# Patient Record
Sex: Female | Born: 1965
Health system: Southern US, Community
[De-identification: ages and names within clinical notes are randomized; demographics above are authoritative.]

## PROBLEM LIST (undated history)

## (undated) DIAGNOSIS — IMO0002 Reserved for concepts with insufficient information to code with codable children: Secondary | ICD-10-CM

## (undated) DIAGNOSIS — M1990 Unspecified osteoarthritis, unspecified site: Secondary | ICD-10-CM

## (undated) DIAGNOSIS — F32A Depression, unspecified: Secondary | ICD-10-CM

## (undated) DIAGNOSIS — F419 Anxiety disorder, unspecified: Secondary | ICD-10-CM

## (undated) DIAGNOSIS — K219 Gastro-esophageal reflux disease without esophagitis: Secondary | ICD-10-CM

## (undated) DIAGNOSIS — G8929 Other chronic pain: Secondary | ICD-10-CM

## (undated) DIAGNOSIS — I10 Essential (primary) hypertension: Secondary | ICD-10-CM

## (undated) DIAGNOSIS — C801 Malignant (primary) neoplasm, unspecified: Secondary | ICD-10-CM

## (undated) DIAGNOSIS — Z5189 Encounter for other specified aftercare: Secondary | ICD-10-CM

## (undated) HISTORY — DX: Encounter for other specified aftercare: Z51.89

## (undated) HISTORY — DX: Malignant (primary) neoplasm, unspecified: C80.1

## (undated) HISTORY — DX: Other chronic pain: G89.29

## (undated) HISTORY — DX: Anxiety disorder, unspecified: F41.9

## (undated) HISTORY — DX: Reserved for concepts with insufficient information to code with codable children: IMO0002

## (undated) HISTORY — PX: LUNG SURGERY: SHX703

---

## 2014-09-02 DIAGNOSIS — I10 Essential (primary) hypertension: Secondary | ICD-10-CM | POA: Insufficient documentation

## 2018-08-23 DIAGNOSIS — C3490 Malignant neoplasm of unspecified part of unspecified bronchus or lung: Secondary | ICD-10-CM | POA: Diagnosis not present

## 2018-08-23 DIAGNOSIS — R0602 Shortness of breath: Secondary | ICD-10-CM | POA: Diagnosis not present

## 2018-09-03 DIAGNOSIS — C3411 Malignant neoplasm of upper lobe, right bronchus or lung: Secondary | ICD-10-CM | POA: Diagnosis not present

## 2018-09-11 DIAGNOSIS — C3490 Malignant neoplasm of unspecified part of unspecified bronchus or lung: Secondary | ICD-10-CM | POA: Diagnosis not present

## 2018-09-17 DIAGNOSIS — R911 Solitary pulmonary nodule: Secondary | ICD-10-CM | POA: Diagnosis not present

## 2018-09-17 DIAGNOSIS — Z85118 Personal history of other malignant neoplasm of bronchus and lung: Secondary | ICD-10-CM | POA: Diagnosis not present

## 2018-09-17 DIAGNOSIS — R918 Other nonspecific abnormal finding of lung field: Secondary | ICD-10-CM | POA: Diagnosis not present

## 2018-09-17 DIAGNOSIS — C3411 Malignant neoplasm of upper lobe, right bronchus or lung: Secondary | ICD-10-CM | POA: Diagnosis not present

## 2018-09-21 DIAGNOSIS — C3490 Malignant neoplasm of unspecified part of unspecified bronchus or lung: Secondary | ICD-10-CM | POA: Diagnosis not present

## 2018-10-02 DIAGNOSIS — R918 Other nonspecific abnormal finding of lung field: Secondary | ICD-10-CM | POA: Insufficient documentation

## 2018-10-19 DIAGNOSIS — R918 Other nonspecific abnormal finding of lung field: Secondary | ICD-10-CM | POA: Diagnosis not present

## 2018-10-19 DIAGNOSIS — C3490 Malignant neoplasm of unspecified part of unspecified bronchus or lung: Secondary | ICD-10-CM | POA: Diagnosis not present

## 2018-10-22 DIAGNOSIS — C3411 Malignant neoplasm of upper lobe, right bronchus or lung: Secondary | ICD-10-CM | POA: Diagnosis not present

## 2018-11-06 DIAGNOSIS — R911 Solitary pulmonary nodule: Secondary | ICD-10-CM | POA: Diagnosis not present

## 2018-11-06 DIAGNOSIS — C3411 Malignant neoplasm of upper lobe, right bronchus or lung: Secondary | ICD-10-CM | POA: Diagnosis not present

## 2018-11-15 DIAGNOSIS — C3411 Malignant neoplasm of upper lobe, right bronchus or lung: Secondary | ICD-10-CM | POA: Diagnosis not present

## 2018-11-15 DIAGNOSIS — R0489 Hemorrhage from other sites in respiratory passages: Secondary | ICD-10-CM | POA: Diagnosis not present

## 2018-11-15 DIAGNOSIS — J439 Emphysema, unspecified: Secondary | ICD-10-CM | POA: Diagnosis not present

## 2018-11-15 DIAGNOSIS — Z452 Encounter for adjustment and management of vascular access device: Secondary | ICD-10-CM | POA: Diagnosis not present

## 2018-11-15 DIAGNOSIS — G8918 Other acute postprocedural pain: Secondary | ICD-10-CM | POA: Diagnosis not present

## 2018-11-16 DIAGNOSIS — J9811 Atelectasis: Secondary | ICD-10-CM | POA: Diagnosis not present

## 2018-11-16 DIAGNOSIS — J439 Emphysema, unspecified: Secondary | ICD-10-CM | POA: Diagnosis not present

## 2018-11-16 DIAGNOSIS — Z452 Encounter for adjustment and management of vascular access device: Secondary | ICD-10-CM | POA: Diagnosis not present

## 2018-11-17 DIAGNOSIS — Z452 Encounter for adjustment and management of vascular access device: Secondary | ICD-10-CM | POA: Diagnosis not present

## 2018-11-17 DIAGNOSIS — J439 Emphysema, unspecified: Secondary | ICD-10-CM | POA: Diagnosis not present

## 2018-11-18 DIAGNOSIS — R079 Chest pain, unspecified: Secondary | ICD-10-CM | POA: Diagnosis not present

## 2018-11-18 DIAGNOSIS — J9811 Atelectasis: Secondary | ICD-10-CM | POA: Diagnosis not present

## 2018-11-19 DIAGNOSIS — C3411 Malignant neoplasm of upper lobe, right bronchus or lung: Secondary | ICD-10-CM | POA: Diagnosis not present

## 2018-11-21 DIAGNOSIS — Z483 Aftercare following surgery for neoplasm: Secondary | ICD-10-CM | POA: Diagnosis not present

## 2018-11-27 DIAGNOSIS — C3411 Malignant neoplasm of upper lobe, right bronchus or lung: Secondary | ICD-10-CM | POA: Diagnosis not present

## 2018-11-27 DIAGNOSIS — Z483 Aftercare following surgery for neoplasm: Secondary | ICD-10-CM | POA: Diagnosis not present

## 2018-12-03 DIAGNOSIS — C3411 Malignant neoplasm of upper lobe, right bronchus or lung: Secondary | ICD-10-CM | POA: Diagnosis not present

## 2018-12-03 DIAGNOSIS — D519 Vitamin B12 deficiency anemia, unspecified: Secondary | ICD-10-CM | POA: Diagnosis not present

## 2018-12-05 DIAGNOSIS — C3411 Malignant neoplasm of upper lobe, right bronchus or lung: Secondary | ICD-10-CM | POA: Diagnosis not present

## 2018-12-06 DIAGNOSIS — Z483 Aftercare following surgery for neoplasm: Secondary | ICD-10-CM | POA: Diagnosis not present

## 2018-12-06 DIAGNOSIS — C3411 Malignant neoplasm of upper lobe, right bronchus or lung: Secondary | ICD-10-CM | POA: Diagnosis not present

## 2018-12-06 DIAGNOSIS — J986 Disorders of diaphragm: Secondary | ICD-10-CM | POA: Diagnosis not present

## 2018-12-17 DIAGNOSIS — Z7189 Other specified counseling: Secondary | ICD-10-CM | POA: Diagnosis not present

## 2018-12-17 DIAGNOSIS — E869 Volume depletion, unspecified: Secondary | ICD-10-CM | POA: Diagnosis not present

## 2018-12-17 DIAGNOSIS — R112 Nausea with vomiting, unspecified: Secondary | ICD-10-CM | POA: Diagnosis not present

## 2018-12-17 DIAGNOSIS — C3411 Malignant neoplasm of upper lobe, right bronchus or lung: Secondary | ICD-10-CM | POA: Diagnosis not present

## 2018-12-17 DIAGNOSIS — Z5111 Encounter for antineoplastic chemotherapy: Secondary | ICD-10-CM | POA: Diagnosis not present

## 2018-12-17 DIAGNOSIS — E876 Hypokalemia: Secondary | ICD-10-CM | POA: Diagnosis not present

## 2018-12-17 DIAGNOSIS — Z5189 Encounter for other specified aftercare: Secondary | ICD-10-CM | POA: Diagnosis not present

## 2019-01-02 DIAGNOSIS — C3490 Malignant neoplasm of unspecified part of unspecified bronchus or lung: Secondary | ICD-10-CM | POA: Insufficient documentation

## 2019-01-03 DIAGNOSIS — J309 Allergic rhinitis, unspecified: Secondary | ICD-10-CM | POA: Diagnosis not present

## 2019-01-03 DIAGNOSIS — F331 Major depressive disorder, recurrent, moderate: Secondary | ICD-10-CM | POA: Diagnosis not present

## 2019-01-03 DIAGNOSIS — C3411 Malignant neoplasm of upper lobe, right bronchus or lung: Secondary | ICD-10-CM | POA: Diagnosis not present

## 2019-01-03 DIAGNOSIS — H1013 Acute atopic conjunctivitis, bilateral: Secondary | ICD-10-CM | POA: Diagnosis not present

## 2019-01-05 DIAGNOSIS — C3411 Malignant neoplasm of upper lobe, right bronchus or lung: Secondary | ICD-10-CM | POA: Diagnosis not present

## 2019-01-28 DIAGNOSIS — C3490 Malignant neoplasm of unspecified part of unspecified bronchus or lung: Secondary | ICD-10-CM | POA: Diagnosis not present

## 2019-01-28 DIAGNOSIS — H1010 Acute atopic conjunctivitis, unspecified eye: Secondary | ICD-10-CM | POA: Diagnosis not present

## 2019-01-28 DIAGNOSIS — J329 Chronic sinusitis, unspecified: Secondary | ICD-10-CM | POA: Diagnosis not present

## 2019-01-28 DIAGNOSIS — J309 Allergic rhinitis, unspecified: Secondary | ICD-10-CM | POA: Diagnosis not present

## 2019-02-03 DIAGNOSIS — C3411 Malignant neoplasm of upper lobe, right bronchus or lung: Secondary | ICD-10-CM | POA: Diagnosis not present

## 2019-02-06 DIAGNOSIS — I10 Essential (primary) hypertension: Secondary | ICD-10-CM | POA: Diagnosis not present

## 2019-02-06 DIAGNOSIS — J449 Chronic obstructive pulmonary disease, unspecified: Secondary | ICD-10-CM | POA: Diagnosis not present

## 2019-02-06 DIAGNOSIS — Z720 Tobacco use: Secondary | ICD-10-CM | POA: Diagnosis not present

## 2019-02-06 DIAGNOSIS — R0609 Other forms of dyspnea: Secondary | ICD-10-CM | POA: Diagnosis not present

## 2019-02-06 DIAGNOSIS — D649 Anemia, unspecified: Secondary | ICD-10-CM | POA: Diagnosis not present

## 2019-02-06 DIAGNOSIS — I351 Nonrheumatic aortic (valve) insufficiency: Secondary | ICD-10-CM | POA: Diagnosis not present

## 2019-02-06 DIAGNOSIS — I2510 Atherosclerotic heart disease of native coronary artery without angina pectoris: Secondary | ICD-10-CM | POA: Diagnosis not present

## 2019-02-26 DIAGNOSIS — C3411 Malignant neoplasm of upper lobe, right bronchus or lung: Secondary | ICD-10-CM | POA: Diagnosis not present

## 2019-03-06 DIAGNOSIS — C3411 Malignant neoplasm of upper lobe, right bronchus or lung: Secondary | ICD-10-CM | POA: Diagnosis not present

## 2019-03-13 DIAGNOSIS — C3411 Malignant neoplasm of upper lobe, right bronchus or lung: Secondary | ICD-10-CM | POA: Diagnosis not present

## 2019-03-13 DIAGNOSIS — C3490 Malignant neoplasm of unspecified part of unspecified bronchus or lung: Secondary | ICD-10-CM | POA: Diagnosis not present

## 2019-03-19 DIAGNOSIS — Z85118 Personal history of other malignant neoplasm of bronchus and lung: Secondary | ICD-10-CM | POA: Diagnosis not present

## 2019-04-02 DIAGNOSIS — Z711 Person with feared health complaint in whom no diagnosis is made: Secondary | ICD-10-CM | POA: Diagnosis not present

## 2019-04-05 DIAGNOSIS — C3411 Malignant neoplasm of upper lobe, right bronchus or lung: Secondary | ICD-10-CM | POA: Diagnosis not present

## 2019-05-06 DIAGNOSIS — C3411 Malignant neoplasm of upper lobe, right bronchus or lung: Secondary | ICD-10-CM | POA: Diagnosis not present

## 2019-05-14 DIAGNOSIS — C3490 Malignant neoplasm of unspecified part of unspecified bronchus or lung: Secondary | ICD-10-CM | POA: Diagnosis not present

## 2019-05-14 DIAGNOSIS — R432 Parageusia: Secondary | ICD-10-CM | POA: Diagnosis not present

## 2019-05-14 DIAGNOSIS — R918 Other nonspecific abnormal finding of lung field: Secondary | ICD-10-CM | POA: Diagnosis not present

## 2019-05-14 DIAGNOSIS — R0602 Shortness of breath: Secondary | ICD-10-CM | POA: Diagnosis not present

## 2019-05-17 DIAGNOSIS — Z87891 Personal history of nicotine dependence: Secondary | ICD-10-CM | POA: Diagnosis not present

## 2019-05-17 DIAGNOSIS — I2510 Atherosclerotic heart disease of native coronary artery without angina pectoris: Secondary | ICD-10-CM | POA: Diagnosis not present

## 2019-05-17 DIAGNOSIS — I6523 Occlusion and stenosis of bilateral carotid arteries: Secondary | ICD-10-CM | POA: Diagnosis not present

## 2019-05-17 DIAGNOSIS — J449 Chronic obstructive pulmonary disease, unspecified: Secondary | ICD-10-CM | POA: Diagnosis not present

## 2019-05-17 DIAGNOSIS — E785 Hyperlipidemia, unspecified: Secondary | ICD-10-CM | POA: Diagnosis not present

## 2019-05-17 DIAGNOSIS — I10 Essential (primary) hypertension: Secondary | ICD-10-CM | POA: Diagnosis not present

## 2019-05-17 DIAGNOSIS — I351 Nonrheumatic aortic (valve) insufficiency: Secondary | ICD-10-CM | POA: Diagnosis not present

## 2019-05-17 DIAGNOSIS — R0600 Dyspnea, unspecified: Secondary | ICD-10-CM | POA: Diagnosis not present

## 2019-05-20 DIAGNOSIS — Z9889 Other specified postprocedural states: Secondary | ICD-10-CM | POA: Diagnosis not present

## 2019-05-20 DIAGNOSIS — C3411 Malignant neoplasm of upper lobe, right bronchus or lung: Secondary | ICD-10-CM | POA: Diagnosis not present

## 2019-06-04 DIAGNOSIS — C3411 Malignant neoplasm of upper lobe, right bronchus or lung: Secondary | ICD-10-CM | POA: Diagnosis not present

## 2019-06-05 DIAGNOSIS — C3411 Malignant neoplasm of upper lobe, right bronchus or lung: Secondary | ICD-10-CM | POA: Diagnosis not present

## 2019-06-06 DIAGNOSIS — Z85118 Personal history of other malignant neoplasm of bronchus and lung: Secondary | ICD-10-CM | POA: Diagnosis not present

## 2019-06-11 DIAGNOSIS — J449 Chronic obstructive pulmonary disease, unspecified: Secondary | ICD-10-CM | POA: Diagnosis not present

## 2019-06-11 DIAGNOSIS — Z1159 Encounter for screening for other viral diseases: Secondary | ICD-10-CM | POA: Diagnosis not present

## 2019-06-11 DIAGNOSIS — Z01812 Encounter for preprocedural laboratory examination: Secondary | ICD-10-CM | POA: Diagnosis not present

## 2019-06-14 DIAGNOSIS — I351 Nonrheumatic aortic (valve) insufficiency: Secondary | ICD-10-CM | POA: Diagnosis not present

## 2019-07-06 DIAGNOSIS — C3411 Malignant neoplasm of upper lobe, right bronchus or lung: Secondary | ICD-10-CM | POA: Diagnosis not present

## 2019-07-16 DIAGNOSIS — C3491 Malignant neoplasm of unspecified part of right bronchus or lung: Secondary | ICD-10-CM | POA: Diagnosis not present

## 2019-07-16 DIAGNOSIS — J9611 Chronic respiratory failure with hypoxia: Secondary | ICD-10-CM | POA: Diagnosis not present

## 2019-07-16 DIAGNOSIS — Z87891 Personal history of nicotine dependence: Secondary | ICD-10-CM | POA: Diagnosis not present

## 2019-07-16 DIAGNOSIS — J449 Chronic obstructive pulmonary disease, unspecified: Secondary | ICD-10-CM | POA: Diagnosis not present

## 2019-08-06 DIAGNOSIS — C3411 Malignant neoplasm of upper lobe, right bronchus or lung: Secondary | ICD-10-CM | POA: Diagnosis not present

## 2019-08-07 DIAGNOSIS — J449 Chronic obstructive pulmonary disease, unspecified: Secondary | ICD-10-CM | POA: Diagnosis not present

## 2019-08-16 DIAGNOSIS — I351 Nonrheumatic aortic (valve) insufficiency: Secondary | ICD-10-CM | POA: Diagnosis not present

## 2019-08-16 DIAGNOSIS — Z720 Tobacco use: Secondary | ICD-10-CM | POA: Diagnosis not present

## 2019-08-16 DIAGNOSIS — I2510 Atherosclerotic heart disease of native coronary artery without angina pectoris: Secondary | ICD-10-CM | POA: Diagnosis not present

## 2019-08-16 DIAGNOSIS — I10 Essential (primary) hypertension: Secondary | ICD-10-CM | POA: Diagnosis not present

## 2019-08-16 DIAGNOSIS — R0600 Dyspnea, unspecified: Secondary | ICD-10-CM | POA: Diagnosis not present

## 2019-08-23 DIAGNOSIS — C3411 Malignant neoplasm of upper lobe, right bronchus or lung: Secondary | ICD-10-CM | POA: Diagnosis not present

## 2019-08-29 DIAGNOSIS — C3411 Malignant neoplasm of upper lobe, right bronchus or lung: Secondary | ICD-10-CM | POA: Diagnosis not present

## 2019-09-05 DIAGNOSIS — C3411 Malignant neoplasm of upper lobe, right bronchus or lung: Secondary | ICD-10-CM | POA: Diagnosis not present

## 2019-09-11 DIAGNOSIS — C3490 Malignant neoplasm of unspecified part of unspecified bronchus or lung: Secondary | ICD-10-CM | POA: Diagnosis not present

## 2019-09-25 DIAGNOSIS — Z1231 Encounter for screening mammogram for malignant neoplasm of breast: Secondary | ICD-10-CM | POA: Diagnosis not present

## 2019-09-25 DIAGNOSIS — R0600 Dyspnea, unspecified: Secondary | ICD-10-CM | POA: Diagnosis not present

## 2019-09-25 DIAGNOSIS — I2510 Atherosclerotic heart disease of native coronary artery without angina pectoris: Secondary | ICD-10-CM | POA: Diagnosis not present

## 2019-09-26 LAB — HM MAMMOGRAPHY

## 2019-10-06 DIAGNOSIS — C3411 Malignant neoplasm of upper lobe, right bronchus or lung: Secondary | ICD-10-CM | POA: Diagnosis not present

## 2019-11-05 DIAGNOSIS — C3411 Malignant neoplasm of upper lobe, right bronchus or lung: Secondary | ICD-10-CM | POA: Diagnosis not present

## 2019-11-25 DIAGNOSIS — Z87891 Personal history of nicotine dependence: Secondary | ICD-10-CM | POA: Diagnosis not present

## 2019-11-25 DIAGNOSIS — I10 Essential (primary) hypertension: Secondary | ICD-10-CM | POA: Diagnosis not present

## 2019-11-25 DIAGNOSIS — I2510 Atherosclerotic heart disease of native coronary artery without angina pectoris: Secondary | ICD-10-CM | POA: Diagnosis not present

## 2019-11-25 DIAGNOSIS — R0600 Dyspnea, unspecified: Secondary | ICD-10-CM | POA: Diagnosis not present

## 2019-11-25 DIAGNOSIS — J449 Chronic obstructive pulmonary disease, unspecified: Secondary | ICD-10-CM | POA: Diagnosis not present

## 2019-11-25 DIAGNOSIS — E785 Hyperlipidemia, unspecified: Secondary | ICD-10-CM | POA: Diagnosis not present

## 2019-12-10 DIAGNOSIS — J90 Pleural effusion, not elsewhere classified: Secondary | ICD-10-CM | POA: Diagnosis not present

## 2019-12-10 DIAGNOSIS — C3490 Malignant neoplasm of unspecified part of unspecified bronchus or lung: Secondary | ICD-10-CM | POA: Diagnosis not present

## 2019-12-12 DIAGNOSIS — C3490 Malignant neoplasm of unspecified part of unspecified bronchus or lung: Secondary | ICD-10-CM | POA: Diagnosis not present

## 2019-12-31 DIAGNOSIS — J449 Chronic obstructive pulmonary disease, unspecified: Secondary | ICD-10-CM | POA: Diagnosis not present

## 2019-12-31 DIAGNOSIS — E785 Hyperlipidemia, unspecified: Secondary | ICD-10-CM | POA: Diagnosis not present

## 2019-12-31 DIAGNOSIS — Z87891 Personal history of nicotine dependence: Secondary | ICD-10-CM | POA: Diagnosis not present

## 2019-12-31 DIAGNOSIS — Z720 Tobacco use: Secondary | ICD-10-CM | POA: Diagnosis not present

## 2019-12-31 DIAGNOSIS — I2510 Atherosclerotic heart disease of native coronary artery without angina pectoris: Secondary | ICD-10-CM | POA: Diagnosis not present

## 2019-12-31 DIAGNOSIS — I351 Nonrheumatic aortic (valve) insufficiency: Secondary | ICD-10-CM | POA: Diagnosis not present

## 2020-01-06 DIAGNOSIS — C3490 Malignant neoplasm of unspecified part of unspecified bronchus or lung: Secondary | ICD-10-CM | POA: Diagnosis not present

## 2020-01-06 DIAGNOSIS — R0600 Dyspnea, unspecified: Secondary | ICD-10-CM | POA: Diagnosis not present

## 2020-01-06 DIAGNOSIS — Z01812 Encounter for preprocedural laboratory examination: Secondary | ICD-10-CM | POA: Diagnosis not present

## 2020-01-06 DIAGNOSIS — Z20822 Contact with and (suspected) exposure to covid-19: Secondary | ICD-10-CM | POA: Diagnosis not present

## 2020-01-10 DIAGNOSIS — I2510 Atherosclerotic heart disease of native coronary artery without angina pectoris: Secondary | ICD-10-CM | POA: Diagnosis not present

## 2020-01-10 DIAGNOSIS — I679 Cerebrovascular disease, unspecified: Secondary | ICD-10-CM | POA: Diagnosis not present

## 2020-01-10 DIAGNOSIS — Z7982 Long term (current) use of aspirin: Secondary | ICD-10-CM | POA: Diagnosis not present

## 2020-01-10 DIAGNOSIS — I10 Essential (primary) hypertension: Secondary | ICD-10-CM | POA: Diagnosis not present

## 2020-01-10 DIAGNOSIS — E663 Overweight: Secondary | ICD-10-CM | POA: Diagnosis not present

## 2020-01-10 DIAGNOSIS — R0609 Other forms of dyspnea: Secondary | ICD-10-CM | POA: Insufficient documentation

## 2020-01-10 DIAGNOSIS — I351 Nonrheumatic aortic (valve) insufficiency: Secondary | ICD-10-CM | POA: Diagnosis not present

## 2020-01-10 DIAGNOSIS — Z87891 Personal history of nicotine dependence: Secondary | ICD-10-CM | POA: Diagnosis not present

## 2020-01-10 DIAGNOSIS — Z902 Acquired absence of lung [part of]: Secondary | ICD-10-CM | POA: Diagnosis not present

## 2020-01-10 DIAGNOSIS — Z8249 Family history of ischemic heart disease and other diseases of the circulatory system: Secondary | ICD-10-CM | POA: Diagnosis not present

## 2020-01-10 DIAGNOSIS — I2584 Coronary atherosclerosis due to calcified coronary lesion: Secondary | ICD-10-CM | POA: Diagnosis not present

## 2020-01-10 DIAGNOSIS — E8881 Metabolic syndrome: Secondary | ICD-10-CM | POA: Diagnosis not present

## 2020-01-10 DIAGNOSIS — Z79899 Other long term (current) drug therapy: Secondary | ICD-10-CM | POA: Diagnosis not present

## 2020-01-10 DIAGNOSIS — D649 Anemia, unspecified: Secondary | ICD-10-CM | POA: Diagnosis not present

## 2020-01-10 DIAGNOSIS — Z6833 Body mass index (BMI) 33.0-33.9, adult: Secondary | ICD-10-CM | POA: Diagnosis not present

## 2020-01-10 DIAGNOSIS — R0600 Dyspnea, unspecified: Secondary | ICD-10-CM | POA: Diagnosis not present

## 2020-01-10 DIAGNOSIS — E785 Hyperlipidemia, unspecified: Secondary | ICD-10-CM | POA: Diagnosis not present

## 2020-01-10 DIAGNOSIS — Z85118 Personal history of other malignant neoplasm of bronchus and lung: Secondary | ICD-10-CM | POA: Diagnosis not present

## 2020-01-10 DIAGNOSIS — J449 Chronic obstructive pulmonary disease, unspecified: Secondary | ICD-10-CM | POA: Diagnosis not present

## 2020-03-09 DIAGNOSIS — C3490 Malignant neoplasm of unspecified part of unspecified bronchus or lung: Secondary | ICD-10-CM | POA: Diagnosis not present

## 2020-03-11 DIAGNOSIS — C3411 Malignant neoplasm of upper lobe, right bronchus or lung: Secondary | ICD-10-CM | POA: Diagnosis not present

## 2020-03-19 ENCOUNTER — Encounter: Payer: Medicaid Other | Admitting: Obstetrics and Gynecology

## 2020-04-10 ENCOUNTER — Encounter: Payer: Medicaid Other | Admitting: Obstetrics and Gynecology

## 2020-06-09 DIAGNOSIS — C3411 Malignant neoplasm of upper lobe, right bronchus or lung: Secondary | ICD-10-CM | POA: Diagnosis not present

## 2020-06-11 DIAGNOSIS — C3491 Malignant neoplasm of unspecified part of right bronchus or lung: Secondary | ICD-10-CM | POA: Diagnosis not present

## 2020-06-25 ENCOUNTER — Ambulatory Visit: Payer: Medicaid Other | Admitting: Adult Health

## 2020-07-13 ENCOUNTER — Ambulatory Visit (INDEPENDENT_AMBULATORY_CARE_PROVIDER_SITE_OTHER): Payer: Medicaid Other | Admitting: Family Medicine

## 2020-07-13 ENCOUNTER — Other Ambulatory Visit: Payer: Self-pay

## 2020-07-13 ENCOUNTER — Encounter: Payer: Self-pay | Admitting: Family Medicine

## 2020-07-13 VITALS — BP 123/72 | HR 82 | Temp 97.1°F | Resp 16 | Ht 62.0 in | Wt 204.8 lb

## 2020-07-13 DIAGNOSIS — Z7689 Persons encountering health services in other specified circumstances: Secondary | ICD-10-CM | POA: Diagnosis not present

## 2020-07-13 DIAGNOSIS — F3341 Major depressive disorder, recurrent, in partial remission: Secondary | ICD-10-CM | POA: Diagnosis not present

## 2020-07-13 DIAGNOSIS — Z124 Encounter for screening for malignant neoplasm of cervix: Secondary | ICD-10-CM | POA: Diagnosis not present

## 2020-07-13 DIAGNOSIS — J432 Centrilobular emphysema: Secondary | ICD-10-CM | POA: Diagnosis not present

## 2020-07-13 DIAGNOSIS — C3491 Malignant neoplasm of unspecified part of right bronchus or lung: Secondary | ICD-10-CM

## 2020-07-13 DIAGNOSIS — E7800 Pure hypercholesterolemia, unspecified: Secondary | ICD-10-CM

## 2020-07-13 DIAGNOSIS — Z1211 Encounter for screening for malignant neoplasm of colon: Secondary | ICD-10-CM

## 2020-07-13 DIAGNOSIS — M25512 Pain in left shoulder: Secondary | ICD-10-CM | POA: Diagnosis not present

## 2020-07-13 DIAGNOSIS — I10 Essential (primary) hypertension: Secondary | ICD-10-CM

## 2020-07-13 DIAGNOSIS — M62838 Other muscle spasm: Secondary | ICD-10-CM | POA: Diagnosis not present

## 2020-07-13 DIAGNOSIS — Z1231 Encounter for screening mammogram for malignant neoplasm of breast: Secondary | ICD-10-CM

## 2020-07-13 DIAGNOSIS — F411 Generalized anxiety disorder: Secondary | ICD-10-CM

## 2020-07-13 DIAGNOSIS — I351 Nonrheumatic aortic (valve) insufficiency: Secondary | ICD-10-CM

## 2020-07-13 DIAGNOSIS — G8929 Other chronic pain: Secondary | ICD-10-CM

## 2020-07-13 DIAGNOSIS — E669 Obesity, unspecified: Secondary | ICD-10-CM | POA: Insufficient documentation

## 2020-07-13 MED ORDER — ROSUVASTATIN CALCIUM 10 MG PO TABS: 10.0000 mg | ORAL_TABLET | Freq: Every day | ORAL | 1 refills | Status: DC

## 2020-07-13 MED ORDER — ALBUTEROL SULFATE HFA 108 (90 BASE) MCG/ACT IN AERS: 2.0000 | INHALATION_SPRAY | RESPIRATORY_TRACT | 2 refills | Status: DC | PRN

## 2020-07-13 MED ORDER — CYCLOBENZAPRINE HCL 10 MG PO TABS: 5.0000 mg | ORAL_TABLET | Freq: Two times a day (BID) | ORAL | 2 refills | Status: DC | PRN

## 2020-07-13 MED ORDER — AMLODIPINE BESYLATE 5 MG PO TABS: 5.0000 mg | ORAL_TABLET | Freq: Every day | ORAL | 1 refills | Status: DC

## 2020-07-13 NOTE — Assessment & Plan Note (Signed)
Chronic problem Stable currently controlled Has some mild chronic depression co morbid Related to chronic health problems recent few years  Has episodic flares, suboptimal result at times on Citalopram 20mg  daily, had improved it in past, has been on med for long time now. No other meds tried Advised will review record, keep on current med, has current rx, no refill needed We can adjust in future

## 2020-07-13 NOTE — Assessment & Plan Note (Signed)
Stable Currently asymptomatic Chronic problem identified on prior stress ECHO Previous followed by Cardiology in [facility] On BB, Statin Follow-up review of records, determine if need new cardiology locally

## 2020-07-13 NOTE — Progress Notes (Signed)
Subjective:    Patient ID: Cheryl Payne, female    DOB: 01/04/1966, 54 y.o.   MRN: [id number]  Cheryl Payne is a 54 y.o. female presenting on 07/13/2020 for Establish Care (patient moved and has not seen PCP from past year needs meds refill)  Previously in [place], and has relocated to [place] area with her boyfriend.  HPI   Here to establish care with new PCP She has multidisciplinary team currently in [facility] system in [place] that she will plan to keep for surveillance of her lung cancer history. - Dr Roseanne Reno (Cardiothoracic Surgery) - Ancil Boozer FNP (Oncology)  Disabled currently.  Non Small Cell Lung Cancer, RIGHT upper S/p chemotherapy/radiation S/p Status post right upper lobectomy, 11/15/18 Tissue diagnosis (invasive, poorly differentiated adenocarcinoma, visceral pleural invasion, without evidence of lymphovascular invasion or spread)  She was diagnosed with Lung Cancer in 07/2018. She had surgery and treatment for lung cancer in December 2019. Followed by Dr Dwyane Dee in [place] and will follow-up with them every 6 months for now. Last CT scan 1-2 months ago, and they have pushed out next follow-up until 12/2020  Last saw Oncology 06/11/20 - Ancil Boozer FNP [facility]), ultimately decision made that it has been 2 years since date of diagnosis, she has been treated and now without evidence of recurrence on last CT imaging, see scan results below, she now will f/u every 6 months with imaging and labs.  Hypertension HYPERLIPIDEMIA Coronary Atherosclerosis with calcification / CAD History of Moderate Aortic Valve Insufficiency  - Reports no concerns, had followed by Cardiologist through [facility] after her surgery. They treated her with Rosuvastatin. She has had work up from them. . Last lipid panel 2019 approx, treated with Rosuvastatin 20mg  nightly, advised to reduce dose due to myalgias and muscle cramping pain - Currently taking half of pill for  dose Rosuvastatin 10mg , tolerating well without side effects or myalgias - Taking Amlodipine 5mg  daily (OUT currently was given emergency fill to avoid running out) - Taking Metoprolol tartrate 25mg  BID for cardioprotection  Prior results, Stress Echo 09/2018: EF 65-70%, moderate to moderately severe AR, mild MR, trace TR.   Centrilobular Emphysema COPD History of Tobacco Abuse, former smoker She has done spirometry was told mild obstructive lung disease or mild COPD Not using any albuterol currently has used in past. Not on maintenance therapy either. Doing well.  Generalized Anxiety Disorder Major Depression, chronic recurrent mild - partial remission Chronic history 10+ years in past, has had primarily issues with generalized anxiety disorder  Additional complaint today Left Shoulder Bursitis Reports symptoms Left Shoulder pain in back of shoulder with some movements out to side, otherwise has good range of motion, previous doctor treated 3-4 years ago, and it improved. No new injury - Taking Ibuprofen 800mg  occasionally, rarely but not always helping  Health Maintenance:  She is due now about 1 year since last testing done for her pap smear and mammogram.  Breast CA Screening: Due for mammogram screening. Last mammogram result bi rads 1 negative on TOMO 3D 09/26/19 done at [facility]. No prior history abnormal mammogram. No known family history of breast cancer. Currently asymptomatic. - Needs order for [facility] [place]  Due for pap smear, she request referral to GYN  Colon CA Screening: Never had colonoscopy. Currently asymptomatic. No known family history of colon CA. Due for screening test considering Cologuard, counseling given wants [place] to order this  UTD COVID19 vaccine, 02/21/20 and   Depression screen [facility] 2/9 07/13/2020  Decreased  Interest 1  Down, Depressed, Hopeless 1  PHQ - 2 Score 2  Altered sleeping 3  Tired, decreased energy 1  Change in appetite 1  Feeling bad or  failure about yourself  0  Trouble concentrating 0  Moving slowly or fidgety/restless 0  Suicidal thoughts 0  PHQ-9 Score 7  Difficult doing work/chores Somewhat difficult   GAD 7 : Generalized Anxiety Score 07/13/2020  Nervous, Anxious, on Edge 2  Control/stop worrying 1  Worry too much - different things 1  Trouble relaxing 2  Restless 2  Easily annoyed or irritable 3  Afraid - awful might happen 0  Total GAD 7 Score 11  Anxiety Difficulty Somewhat difficult      Past Medical History:  Diagnosis Date  . Cancer ([place])    lung  . Chronic left shoulder pain    Past Surgical History:  Procedure Laterality Date  . LUNG SURGERY     Social History   Socioeconomic History  . Marital status: Single    Spouse name: Not on file  . Number of children: Not on file  . Years of education: Not on file  . Highest education level: Not on file  Occupational History  . Not on file  Tobacco Use  . Smoking status: Former Smoker    Packs/day: 1.00    Years: 35.00    Pack years: 35.00    Types: Cigarettes  . Smokeless tobacco: Former Systems developer    Quit date: 06/04/2018  Substance and Sexual Activity  . Alcohol use: Yes  . Drug use: Never  . Sexual activity: Not on file  Other Topics Concern  . Not on file  Social History Narrative  . Not on file   Social Determinants of Health   Financial Resource Strain:   . Difficulty of Paying Living Expenses:   Food Insecurity:   . Worried About Charity fundraiser in the Last Year:   . Arboriculturist in the Last Year:   Transportation Needs:   . Film/video editor (Medical):   Marland Kitchen Lack of Transportation (Non-Medical):   Physical Activity:   . Days of Exercise per Week:   . Minutes of Exercise per Session:   Stress:   . Feeling of Stress :   Social Connections:   . Frequency of Communication with Friends and Family:   . Frequency of Social Gatherings with Friends and Family:   . Attends Religious Services:   . Active Member of Clubs  or Organizations:   . Attends Archivist Meetings:   Marland Kitchen Marital Status:   Intimate Partner Violence:   . Fear of Current or Ex-Partner:   . Emotionally Abused:   Marland Kitchen Physically Abused:   . Sexually Abused:    Family History  Problem Relation Age of Onset  . Cancer Mother        lung  . Cancer Father    Current Outpatient Medications on File Prior to Visit  Medication Sig  . aspirin EC 81 MG tablet Take 81 mg by mouth daily. Swallow whole.  . citalopram (CELEXA) 20 MG tablet Take 20 mg by mouth daily.  . cyanocobalamin 1000 MCG tablet Take 1,000 mcg by mouth daily.  . metoprolol tartrate (LOPRESSOR) 25 MG tablet Take 25 mg by mouth 2 (two) times daily.   No current facility-administered medications on file prior to visit.    Review of Systems Per HPI unless specifically indicated above      Objective:  BP 123/72   Pulse 82   Temp (!) 97.1 F (36.2 C) (Temporal)   Resp 16   Ht 5\' 2"  (1.575 m)   Wt 204 lb 12.8 oz (92.9 kg)   SpO2 99%   BMI 37.46 kg/m   Wt Readings from Last 3 Encounters:  07/13/20 204 lb 12.8 oz (92.9 kg)    Physical Exam Vitals and nursing note reviewed.  Constitutional:      General: She is not in acute distress.    Appearance: She is well-developed. She is obese. She is not diaphoretic.     Comments: Well-appearing, comfortable, cooperative  HENT:     Head: Normocephalic and atraumatic.  Eyes:     General:        Right eye: No discharge.        Left eye: No discharge.     Conjunctiva/sclera: Conjunctivae normal.  Neck:     Thyroid: No thyromegaly.  Cardiovascular:     Rate and Rhythm: Normal rate and regular rhythm.     Heart sounds: Normal heart sounds. No murmur heard.   Pulmonary:     Effort: Pulmonary effort is normal. No respiratory distress.     Breath sounds: Normal breath sounds. No wheezing or rales.  Musculoskeletal:        General: Normal range of motion.     Cervical back: Normal range of motion and neck  supple.     Right lower leg: No edema.     Left lower leg: No edema.     Comments: Left Shoulder Inspection: Normal appearance bilateral symmetrical Palpation: LOCALIZED tender and muscle spasm of left posterior levator scap muscles / trapezius otherwise non-tender to palpation over anterior, lateral shoulder  ROM: Full intact active ROM forward flexion, abduction, internal / external rotation, symmetrical Special Testing: Rotator cuff testing negative for weakness Strength: Normal strength 5/5 flex/ext, ext rot / int rot, grip, rotator cuff str testing. Neurovascular: Distally intact pulses, sensation to light touch   Lymphadenopathy:     Cervical: No cervical adenopathy.  Skin:    General: Skin is warm and dry.     Findings: No erythema or rash.  Neurological:     Mental Status: She is alert and oriented to person, place, and time.  Psychiatric:        Behavior: Behavior normal.     Comments: Well groomed, good eye contact, normal speech and thoughts      CT Chest W IV Contrast  Anatomical Region Laterality Modality  Chest -- Computed Tomography  Impression Performed by [id number] IMPRESSION:   No evidence of new or recurrent metastatic disease.    Electronically Signed by: Haig Prophet, MD Narrative Performed by [phone] CHEST CT WITH INTRAVENOUS CONTRAST:   TECHNIQUE: Multiple axial CT images of the chest after the administration of 92ml of Isovue 370 intravenous contrast. Coronal and sagittal images were obtained. CT dose reduction techniques utilized.   PROVIDED CLINICAL INDICATION: C34.11-Malignant neoplasm of upper lobe, right bronchus or lung (#) follow up lung cancer  ADDITIONAL CLINICAL INDICATION: None available   COMPARISON: Chest CT from March 09, 2020   INTERPRETATION:   CHEST:  There is no evidence of mediastinal lymphadenopathy. The heart is normal in size.  Right upper lobectomy changes are present with chain sutures. No concerning pulmonary nodularity, or  local recurrent disease is identified.  Bony thorax is age appropriate and soft tissues are unremarkable. Old posterior right-sided rib fracture is present. Procedure Note  Jarome Matin -  06/09/2020  Formatting of this note might be different from the original.  CHEST CT WITH INTRAVENOUS CONTRAST:   TECHNIQUE: Multiple axial CT images of the chest after the administration of 38ml of Isovue 370 intravenous contrast. Coronal and sagittal images were obtained. CT dose reduction techniques utilized.   PROVIDED CLINICAL INDICATION: C34.11-Malignant neoplasm of upper lobe, right bronchus or lung (#) follow up lung cancer  ADDITIONAL CLINICAL INDICATION: None available   COMPARISON: Chest CT from March 09, 2020   INTERPRETATION:   CHEST:  There is no evidence of mediastinal lymphadenopathy. The heart is normal in size.  Right upper lobectomy changes are present with chain sutures. No concerning pulmonary nodularity, or local recurrent disease is identified.  Bony thorax is age appropriate and soft tissues are unremarkable. Old posterior right-sided rib fracture is present.    IMPRESSION:   No evidence of new or recurrent metastatic disease.    Electronically Signed by: Haig Prophet, MD Specimen Collected: 06/09/20 12:22 PM   ----------------------------------------------  Transesophageal Echocardiogram W/O Contrast  Narrative                          [facility]                      [address]                      [place], [place] [address]                      [id number]   Transesophageal Echocardiogram  Name: MARTI, MCLANE DENISEStudy Date: 06/14/2019 08:26 AM  BP: 119/70 mmHg  MRN: [id number]        Patient Location: [facility]^^^[facility]  DOB: 02-Sep-1966      Gender: Female          Height: 89 in  Age:  6 yrs         Race: Black or African Ame    Weight: 181 lb  Reason For Study: I35.1^Nonrheumatic aortic (valve)  insufficiency^I10                       BSA: 1.78m2  Ordering Physician: [id number]^^^^^[id number]  Admitting Physician: Sinda Du A  Performed By: Darlin Coco   Procedure/Quality A complete transesophageal echocardiogram was performed  (2D, 3D reconstruction, Doppler, and color flow Doppler). The probe was  swallowed without complications. Agitated saline contrast was used. Patient  underwent a transesophageal echocardiogram under sedation.    Interpretation Summary  Left ventricular systolic function is normal.  Ejection Fractionis 65-70 %.  Mild aortic sclerosis is present with good valvular opening.  Poor coaptation of left and noncoronary cusps.  There is moderate to moderate-severe aortic regurgitation (2-3+).  AI vena contracta 0.26 cm.  Contrast injected, no interatrial shunt.  No thrombus is detected in the left atrial appendage.  There is trace tricuspid regurgitation.  There is trace pulmonic valvular regurgitation.   Left Ventricle  The left ventricle is normal in size. There is normal left ventricular wall  thickness. Left ventricular systolic function is normal. Ejection Fraction  is 65-70 %.    Right Ventricle  The right ventricle is normal size. The right ventricular ejection fraction  is normal.   Atria  The  left atrium is normal in size with no visual thrombus identified.  Contrast injected, no interatrial shunt. No thrombus is detected in the  left atrial appendage. The right atrium is normal.   Mitral Valve  The mitral valve leaflets appear normal. There is no evidence of stenosis,  fluttering, or prolapse. There is no evidence of mitral valve prolapse.  There is no mitral valve stenosis. There is no mitral regurgitation noted.    Tricuspid Valve  The tricuspid valve leaflets are thin  and pliable and the valve motion is  normal. There is no tricuspid stenosis. There is trace tricuspid  regurgitation.   Aortic Valve  Mild aortic sclerosis is present with good valvular opening. Poor  coaptation of left and noncoronary cusps. There is moderate to moderate-  severe aortic regurgitation (2-3+). AI venacontracta 0.26 cm.   Pulmonic Valve  The pulmonic valve leaflets are thin and pliable; valve motion is normal.  There is no pulmonic valvular stenosis. There is trace pulmonic valvular  regurgitation.   Vessels  The aortic root is normal.   Pericardium  There is no pericardial effusion.    ____________________________________________________________________________   Electronically signed RJ:JOACZY Wilmot on 06/16/2019 04:57 PM  ------------------------------  Mammo 3D Tomo Screening Bilateral  Anatomical Region Laterality Modality  Breast bilateral Mammography  Impression Performed by [id number] IMPRESSION: No mammographic evidence of malignancy.   Recommendation: Annual screening mammography. Tomosynthesis is recommended.     BI-RADS 1: NEGATIVE    Electronically Signed by: Ward Givens, DO Narrative Performed by [id number] Indication: Breast cancer screening   Comparison: Comparison made to prior studies.   Technique: Routine full field digital mammographic views were obtained with supplemental digital breast tomosynthesis. Computer assisted detection (CAD) was utilized in the interpretation.   Density: There are scattered areas of fibroglandular density.   Findings: No abnormal mass, distortion, or suspicious microcalcifications to suggest malignancy. Specimen Collected: 09/26/19 8:43 AM       No results found for this or any previous visit.    Assessment & Plan:   Problem List Items Addressed This Visit    Pure hypercholesterolemia - Primary    Previously Controlled cholesterol on statin and lifestyle Had myalgia on statin Last lipid  panel 2020 Known CAD  Plan: 1. Continue current meds - Rosuvastatin 10mg  nightly - new order, reduced dose, since had myalgia on 20, was taking HALF for 10mg  2. Continue ASA 81mg  for primary ASCVD risk reduction 3. Encourage improved lifestyle - low carb/cholesterol, reduce portion size, continue improving regular exercise      Relevant Medications   metoprolol tartrate (LOPRESSOR) 25 MG tablet   aspirin EC 81 MG tablet   rosuvastatin (CRESTOR) 10 MG tablet   amLODipine (NORVASC) 5 MG tablet   Non-small cell carcinoma of lung (HCC)    Followed by multidisciplinary team ([facility] - with Oncology, Cardiothoracic Surgery) Currently without evidence of recurrence, now for 2 years S/p chemotherapy, radiation, surgical resection RUL  Plan Follow with specialists, she will return to them q 6 months, and not re-establish with oncology team locally Proceed with CT imaging and labs as indicated      Relevant Medications   aspirin EC 81 MG tablet   Muscle spasm of left shoulder    Likely secondary to levator scap vs trapezius muscle spasm on exam No sign of rotator cuff dysfunction or weakness today  Reassurance Treat with rx flexeril PRN 5-10mg  caution sedation, may help rest May use topical voltaren NSAID Tylenol PRN F/u  may consider trigger point injection if indicated, f/u      Relevant Medications   cyclobenzaprine (FLEXERIL) 10 MG tablet   Morbid obesity ([place])    Secondary to BMI >37 with co morbid conditions, CAD, Hypertension, Depression, Hyperlipidemia  Encourage lifestyle diet exercise      Moderate aortic valve insufficiency    Stable Currently asymptomatic Chronic problem identified on prior stress ECHO Previous followed by Cardiology in [facility] On [place], Statin Follow-up review of records, determine if need new cardiology locally      Relevant Medications   metoprolol tartrate (LOPRESSOR) 25 MG tablet   aspirin EC 81 MG tablet   rosuvastatin  (CRESTOR) 10 MG tablet   amLODipine (NORVASC) 5 MG tablet   Major depressive disorder, recurrent, in partial remission ([place])    See A&P for GAD Chronic recurrent mild partial remission On SSRI      Relevant Medications   citalopram (CELEXA) 20 MG tablet   HTN (hypertension)    Well-controlled HTN - Home BP readings reviewed  Complication CAD   Plan:  1. Continue current BP regimen Amlodipine 5mg  daily re order 90 day, continue current Metoprolol 25mg  BID 2. Encourage improved lifestyle - low sodium diet, regular exercise 3. Continue monitor BP outside office, bring readings to next visit, if persistently >140/90 or new symptoms notify office sooner  Review records, consider establish locally with new Cardiology      Relevant Medications   metoprolol tartrate (LOPRESSOR) 25 MG tablet   aspirin EC 81 MG tablet   rosuvastatin (CRESTOR) 10 MG tablet   amLODipine (NORVASC) 5 MG tablet   GAD (generalized anxiety disorder)    Chronic problem Stable currently controlled Has some mild chronic depression co morbid Related to chronic health problems recent few years  Has episodic flares, suboptimal result at times on Citalopram 20mg  daily, had improved it in past, has been on med for long time now. No other meds tried Advised will review record, keep on current med, has current rx, no refill needed We can adjust in future      Relevant Medications   citalopram (CELEXA) 20 MG tablet   Encounter for screening mammogram for malignant neoplasm of breast   Relevant Orders   MM 3D SCREEN BREAST BILATERAL   Chronic left shoulder pain   Relevant Medications   citalopram (CELEXA) 20 MG tablet   aspirin EC 81 MG tablet   cyclobenzaprine (FLEXERIL) 10 MG tablet   Centrilobular emphysema (HCC)    Stable without flare Known mild obstructive COPD identified on CT imaging / Spirometry S/p lung cancer Currently not on maintenance Will re order Albuterol for PRN only rescue       Relevant Medications   albuterol (VENTOLIN HFA) 108 (90 Base) MCG/ACT inhaler    Other Visit Diagnoses    Encounter to establish care with new doctor       Screening for colon cancer       Relevant Orders   Cologuard   Screening for cervical cancer       Relevant Orders   Ambulatory referral to Obstetrics / Gynecology     Will review outside records in [facility]  Due for routine colon cancer screening. Never had colonoscopy (not interested), no family history colon cancer. - Discussion today about recommendations for either Colonoscopy or Cologuard screening, benefits and risks of screening, interested in Cologuard, understands that if positive then recommendation is for diagnostic colonoscopy to follow-up. - Ordered Cologuard today  - Patient advised  to contact insurance first to learn cost   Refer to GYN for Pap Smear screening for cervical cancer  Ordered Mammogram to [facility], she can get release form to send them last images from [place] 09/2019   Orders Placed This Encounter  Procedures  . MM 3D SCREEN BREAST BILATERAL    Standing Status:   Future    Standing Expiration Date:   01/13/2021    Order Specific Question:   Reason for Exam (SYMPTOM  OR DIAGNOSIS REQUIRED)    Answer:   Screening bilateral 3D Mammogram Tomo    Order Specific Question:   Preferred imaging location?    Answer:   [facility]  . Cologuard  . Ambulatory referral to Obstetrics / Gynecology    Referral Priority:   Routine    Referral Type:   Consultation    Referral Reason:   Specialty Services Required    Requested Specialty:   Obstetrics and Gynecology    Number of Visits Requested:   1     Meds ordered this encounter  Medications  . rosuvastatin (CRESTOR) 10 MG tablet    Sig: Take 1 tablet (10 mg total) by mouth at bedtime.    Dispense:  90 tablet    Refill:  1  . amLODipine (NORVASC) 5 MG tablet    Sig: Take 1 tablet (5 mg total) by mouth daily.    Dispense:  90 tablet     Refill:  1  . albuterol (VENTOLIN HFA) 108 (90 Base) MCG/ACT inhaler    Sig: Inhale 2 puffs into the lungs every 4 (four) hours as needed for wheezing or shortness of breath.    Dispense:  6.7 g    Refill:  2  . cyclobenzaprine (FLEXERIL) 10 MG tablet    Sig: Take 0.5-1 tablets (5-10 mg total) by mouth 2 (two) times daily as needed for muscle spasms.    Dispense:  30 tablet    Refill:  2     Follow up plan: Return in about 4 weeks (around 08/10/2020) for Follow-up 4 weeks for Annual Physical (in AM, fasting lab AFTER visit).   Will be due for routine HIV, Hep C screening, did not see on prior outside lab results.  Nobie Putnam, [facility] 07/13/2020, 3:32 PM

## 2020-07-13 NOTE — Assessment & Plan Note (Signed)
Previously Controlled cholesterol on statin and lifestyle Had myalgia on statin Last lipid panel 2020 Known CAD  Plan: 1. Continue current meds - Rosuvastatin 10mg  nightly - new order, reduced dose, since had myalgia on 20, was taking HALF for 10mg  2. Continue ASA 81mg  for primary ASCVD risk reduction 3. Encourage improved lifestyle - low carb/cholesterol, reduce portion size, continue improving regular exercise

## 2020-07-13 NOTE — Assessment & Plan Note (Addendum)
Secondary to BMI >37 with co morbid conditions, CAD, Hypertension, Depression, Hyperlipidemia  Encourage lifestyle diet exercise

## 2020-07-13 NOTE — Assessment & Plan Note (Signed)
See A&P for GAD Chronic recurrent mild partial remission On SSRI

## 2020-07-13 NOTE — Assessment & Plan Note (Signed)
Well-controlled HTN - Home BP readings reviewed  Complication CAD   Plan:  1. Continue current BP regimen Amlodipine 5mg  daily re order 90 day, continue current Metoprolol 25mg  BID 2. Encourage improved lifestyle - low sodium diet, regular exercise 3. Continue monitor BP outside office, bring readings to next visit, if persistently >140/90 or new symptoms notify office sooner  Review records, consider establish locally with new Cardiology

## 2020-07-13 NOTE — Assessment & Plan Note (Signed)
Stable without flare Known mild obstructive COPD identified on CT imaging / Spirometry S/p lung cancer Currently not on maintenance Will re order Albuterol for PRN only rescue

## 2020-07-13 NOTE — Patient Instructions (Addendum)
Thank you for coming to the office today.  We will work on a wellness check in the near future to do blood panel and screening.  New rx Rosuvastatin '10mg'$  nightly  We can refer you to a GYN locally for your women's health concerns and pap smear  [facility] [address] [place], [place] [address] Hours: [place] Main: [place]   Address: [address], [place], [place], [place], [place] [address] Hours: 8AM-5PM Phone: [phone] ----------------------------------  For Mammogram screening for breast cancer   Call the [place] below anytime to schedule your own appointment now that order has been placed.  [facility] [place], [place] [address] Phone: [phone]  ----------------------------------------------  Left shoulder, with muscle spasm. Likely the cause of your pain, less likely to be bursitis  Caution with Ibuprofen can harm stomach lining, kidneys and heart I would switch to TOPICAL Voltaren gel (diclofenac is generic) - can use this on shoulder 2-4 times a day as needed for pain and anti inflammatory relief  Start Cyclobenzapine (Flexeril) '10mg'$  tablets (muscle relaxant) - start with half (cut) to one whole pill at night for muscle relaxant - may make you sedated or sleepy (be careful driving or working on this) if tolerated you can take half to whole tab 2 to 3 times daily or every 8 hours as needed  ------------------------------------------  Colon Cancer Screening: - For all adults age 16+ routine colon cancer screening is highly recommended.     - Recent guidelines from [place] recommend starting age of 39 - Early detection of colon cancer is important, because often there are no warning signs or symptoms, also if found early usually it can be cured. Late stage is hard to treat.  - If you are not interested in  Colonoscopy screening (if done and normal you could be cleared for 5 to 10 years until next due), then Cologuard is an excellent alternative for screening test for Colon Cancer. It is highly sensitive for detecting DNA of colon cancer from even the earliest stages. Also, there is NO bowel prep required. - If Cologuard is NEGATIVE, then it is good for 3 years before next due - If Cologuard is POSITIVE, then it is strongly advised to get a Colonoscopy, which allows the GI doctor to locate the source of the cancer or polyp (even very early stage) and treat it by removing it. ------------------------- If you would like to proceed with Cologuard (stool DNA test) - FIRST, call your insurance company and tell them you want to check cost of Cologuard tell them CPT Code [phone] (it may be completely covered and you could get for no cost, OR max cost without any coverage is about $600). Also, keep in mind if you do NOT open the kit, and decide not to do the test, you will NOT be charged, you should contact the company if you decide not to do the test. - If you want to proceed, you can notify us (phone message, [place], or at next visit) and we will order it for you. The test kit will be delivered to you house within about 1 week. Follow instructions to collect sample, you may call the company for any help or questions, 24/7 telephone support at [phone].   DUE for FASTING BLOOD WORK (no food or drink after midnight before the lab appointment, only water or coffee without  cream/sugar on the morning of)  SCHEDULE "Lab Only" visit in the morning at the clinic for lab draw in 4 WEEKS   For Lab Results, once available within 2-3 days of blood draw, you can can log in to MyChart online to view your results and a brief explanation. Also, we can discuss results at next follow-up visit.   Please schedule a Follow-up Appointment to: Return in about 4 weeks (around 08/10/2020) for Follow-up 4 weeks for Annual  Physical (in AM, fasting lab AFTER visit).  If you have any other questions or concerns, please feel free to call the office or send a message through [place]. You may also schedule an earlier appointment if necessary.  Additionally, you may be receiving a survey about your experience at our office within a few days to 1 week by e-mail or mail. We value your feedback.  Nobie Putnam, DO [place]

## 2020-07-13 NOTE — Assessment & Plan Note (Signed)
Likely secondary to levator scap vs trapezius muscle spasm on exam No sign of rotator cuff dysfunction or weakness today  Reassurance Treat with rx flexeril PRN 5-10mg  caution sedation, may help rest May use topical voltaren NSAID Tylenol PRN F/u may consider trigger point injection if indicated, f/u

## 2020-07-13 NOTE — Assessment & Plan Note (Signed)
Followed by multidisciplinary team ([facility] - with Oncology, Cardiothoracic Surgery) Currently without evidence of recurrence, now for 2 years S/p chemotherapy, radiation, surgical resection RUL  Plan Follow with specialists, she will return to them q 6 months, and not re-establish with oncology team locally Proceed with CT imaging and labs as indicated

## 2020-07-29 ENCOUNTER — Encounter: Payer: Self-pay | Admitting: Obstetrics and Gynecology

## 2020-07-29 ENCOUNTER — Other Ambulatory Visit (HOSPITAL_COMMUNITY)
Admission: RE | Admit: 2020-07-29 | Discharge: 2020-07-29 | Disposition: A | Discharge status: Home / Self Care | Payer: Medicaid Other | Source: Ambulatory Visit | Attending: Obstetrics and Gynecology | Admitting: Obstetrics and Gynecology

## 2020-07-29 ENCOUNTER — Ambulatory Visit (INDEPENDENT_AMBULATORY_CARE_PROVIDER_SITE_OTHER): Payer: Medicaid Other | Admitting: Obstetrics and Gynecology

## 2020-07-29 ENCOUNTER — Other Ambulatory Visit: Payer: Self-pay

## 2020-07-29 VITALS — BP 131/83 | HR 90 | Ht 62.0 in | Wt 209.8 lb

## 2020-07-29 DIAGNOSIS — Z01419 Encounter for gynecological examination (general) (routine) without abnormal findings: Secondary | ICD-10-CM | POA: Diagnosis not present

## 2020-07-29 DIAGNOSIS — Z124 Encounter for screening for malignant neoplasm of cervix: Secondary | ICD-10-CM

## 2020-07-29 NOTE — Addendum Note (Signed)
Addended by: Durwin Glaze on: 07/29/2020 11:17 AM   Modules accepted: Orders

## 2020-07-29 NOTE — Progress Notes (Signed)
HPI:      [name] is a 54 y.o. No obstetric history on file. who LMP was No LMP recorded. Patient is postmenopausal.  Subjective:   She presents today for her annual examination.  She is menopausal and has occasional hot flashes but has learned to deal with them.  She has no GYN complaints although she does state that she is "overdue for her Pap smear". She has a history of lung cancer but states that she is cancer free.  She no longer smokes cigarettes.    Hx: The following portions of the patient's history were reviewed and updated as appropriate:             She  has a past medical history of Cancer ([place]) and Chronic left shoulder pain. She does not have any pertinent problems on file. She  has a past surgical history that includes Lung surgery. Her family history includes Lung cancer (age of onset: 25) in her father; Stomach cancer (age of onset: 23) in her mother. She  reports that she has quit smoking. Her smoking use included cigarettes. She has a 35.00 pack-year smoking history. She quit smokeless tobacco use about 2 years ago. She reports current alcohol use. She reports that she does not use drugs. She has a current medication list which includes the following prescription(s): albuterol, amlodipine, aspirin ec, citalopram, cyanocobalamin, cyclobenzaprine, metoprolol tartrate, and rosuvastatin. She has No Known Allergies.       Review of Systems:  Review of Systems  Constitutional: Denied constitutional symptoms, night sweats, recent illness, fatigue, fever, insomnia and weight loss.  Eyes: Denied eye symptoms, eye pain, photophobia, vision change and visual disturbance.  Ears/Nose/Throat/Neck: Denied ear, nose, throat or neck symptoms, hearing loss, nasal discharge, sinus congestion and sore throat.  Cardiovascular: Denied cardiovascular symptoms, arrhythmia, chest pain/pressure, edema, exercise intolerance, orthopnea and palpitations.  Respiratory: Denied pulmonary  symptoms, asthma, pleuritic pain, productive sputum, cough, dyspnea and wheezing.  Gastrointestinal: Denied, gastro-esophageal reflux, melena, nausea and vomiting.  Genitourinary: Denied genitourinary symptoms including symptomatic vaginal discharge, pelvic relaxation issues, and urinary complaints.  Musculoskeletal: Denied musculoskeletal symptoms, stiffness, swelling, muscle weakness and myalgia.  Dermatologic: Denied dermatology symptoms, rash and scar.  Neurologic: Denied neurology symptoms, dizziness, headache, neck pain and syncope.  Psychiatric: Denied psychiatric symptoms, anxiety and depression.  Endocrine: Denied endocrine symptoms including hot flashes and night sweats.   Meds:   Current Outpatient Medications on File Prior to Visit  Medication Sig Dispense Refill  . albuterol (VENTOLIN HFA) 108 (90 Base) MCG/ACT inhaler Inhale 2 puffs into the lungs every 4 (four) hours as needed for wheezing or shortness of breath. 6.7 g 2  . amLODipine (NORVASC) 5 MG tablet Take 1 tablet (5 mg total) by mouth daily. 90 tablet 1  . aspirin EC 81 MG tablet Take 81 mg by mouth daily. Swallow whole.    . citalopram (CELEXA) 20 MG tablet Take 20 mg by mouth daily.    . cyanocobalamin 1000 MCG tablet Take 1,000 mcg by mouth daily.    . cyclobenzaprine (FLEXERIL) 10 MG tablet Take 0.5-1 tablets (5-10 mg total) by mouth 2 (two) times daily as needed for muscle spasms. 30 tablet 2  . metoprolol tartrate (LOPRESSOR) 25 MG tablet Take 25 mg by mouth 2 (two) times daily.    . rosuvastatin (CRESTOR) 10 MG tablet Take 1 tablet (10 mg total) by mouth at bedtime. 90 tablet 1   No current facility-administered medications on file prior to visit.  Objective:     Vitals:   07/29/20 0946  BP: 131/83  Pulse: 90              Physical examination General NAD, Conversant  HEENT Atraumatic; Op clear with mmm.  Normo-cephalic. Pupils reactive. Anicteric sclerae  Thyroid/Neck Smooth without nodularity or  enlargement. Normal ROM.  Neck Supple.  Skin No rashes, lesions or ulceration. Normal palpated skin turgor. No nodularity.  Breasts: No masses or discharge.  Symmetric.  No axillary adenopathy.  Lungs: Clear to auscultation.No rales or wheezes. Normal Respiratory effort, no retractions.  Heart: NSR.  No murmurs or rubs appreciated. No periferal edema  Abdomen: Soft.  Non-tender.  No masses.  No HSM. No hernia  Extremities: Moves all appropriately.  Normal ROM for age. No lymphadenopathy.  Neuro: Oriented to PPT.  Normal mood. Normal affect.     Pelvic:   Vulva: Normal appearance.  No lesions.  Vagina: No lesions or abnormalities noted.  Mild atrophy  Support: Normal pelvic support.  Urethra No masses tenderness or scarring.  Meatus Normal size without lesions or prolapse.  Cervix: Normal appearance.  No lesions.  Moderate cervical stenosis  Anus: Normal exam.  No lesions.  Perineum: Normal exam.  No lesions.        Bimanual   Uterus: Normal size.  Non-tender.  Mobile.  AV.  Adnexae: No masses.  Non-tender to palpation.  Cul-de-sac: Negative for abnormality.      Assessment:    No obstetric history on file. Patient Active Problem List   Diagnosis Date Noted  . Pure hypercholesterolemia 07/13/2020  . Major depressive disorder, recurrent, in partial remission ([place]) 07/13/2020  . GAD (generalized anxiety disorder) 07/13/2020  . Encounter for screening mammogram for malignant neoplasm of breast 07/13/2020  . Muscle spasm of left shoulder 07/13/2020  . Chronic left shoulder pain 07/13/2020  . Centrilobular emphysema ([place]) 07/13/2020  . Moderate aortic valve insufficiency 07/13/2020  . Morbid obesity ([place]) 07/13/2020  . Non-small cell carcinoma of lung ([place]) 01/02/2019  . HTN (hypertension) 09/02/2014     1. Well woman exam with routine gynecological exam   2. Screening for cervical cancer     Patient doing well without GYN complaints.   Plan:            1.  Basic  Screening Recommendations The basic screening recommendations for asymptomatic women were discussed with the patient during her visit.  The age-appropriate recommendations were discussed with her and the rational for the tests reviewed.  When I am informed by the patient that another primary care physician has previously obtained the age-appropriate tests and they are up-to-date, only outstanding tests are ordered and referrals given as necessary.  Abnormal results of tests will be discussed with her when all of her results are completed.  Routine preventative health maintenance measures emphasized: Exercise/Diet/Weight control, Tobacco Warnings, Alcohol/Substance use risks and Stress Management Pap cotest performed-mammogram ordered.  Patient states she is scheduled for blood work through her family physician in early September. Orders No orders of the defined types were placed in this encounter.   No orders of the defined types were placed in this encounter.           F/U  Return in about 1 year (around 07/29/2021) for Annual Physical.  Finis Bud, M.D. 07/29/2020 10:12 AM

## 2020-07-30 LAB — CYTOLOGY - PAP
Comment: NEGATIVE
Diagnosis: NEGATIVE
High risk HPV: NEGATIVE

## 2020-08-05 DIAGNOSIS — Z1211 Encounter for screening for malignant neoplasm of colon: Secondary | ICD-10-CM | POA: Diagnosis not present

## 2020-08-06 LAB — COLOGUARD: Cologuard: NEGATIVE

## 2020-08-13 ENCOUNTER — Other Ambulatory Visit: Payer: Self-pay

## 2020-08-13 ENCOUNTER — Ambulatory Visit (INDEPENDENT_AMBULATORY_CARE_PROVIDER_SITE_OTHER): Payer: Medicaid Other | Admitting: Family Medicine

## 2020-08-13 ENCOUNTER — Encounter: Payer: Self-pay | Admitting: Family Medicine

## 2020-08-13 VITALS — BP 141/75 | HR 86 | Temp 96.9°F | Resp 16 | Ht 62.0 in | Wt 209.0 lb

## 2020-08-13 DIAGNOSIS — Z0000 Encounter for general adult medical examination without abnormal findings: Secondary | ICD-10-CM | POA: Diagnosis not present

## 2020-08-13 DIAGNOSIS — Z1159 Encounter for screening for other viral diseases: Secondary | ICD-10-CM

## 2020-08-13 DIAGNOSIS — E7800 Pure hypercholesterolemia, unspecified: Secondary | ICD-10-CM | POA: Diagnosis not present

## 2020-08-13 DIAGNOSIS — I10 Essential (primary) hypertension: Secondary | ICD-10-CM | POA: Diagnosis not present

## 2020-08-13 NOTE — Progress Notes (Signed)
Subjective:    Patient ID: Cheryl Payne, female    DOB: [birth year], 54 y.o.   MRN: [id number]  Cheryl Payne is a 54 y.o. female presenting on 08/13/2020 for Annual Exam   HPI   She has multidisciplinary team currently in [facility] in [place] that she will plan to keep for surveillance of her lung cancer history. - Dr Roseanne Reno (Cardiothoracic Surgery) - Ancil Boozer FNP (Oncology)  Non Small Cell Lung Cancer, RIGHT upper S/p chemotherapy/radiation S/p Status post right upper lobectomy, 11/15/18 Tissue diagnosis (invasive, poorly differentiated adenocarcinoma, visceral pleural invasion, without evidence of lymphovascular invasion or spread) She was diagnosed with Lung Cancer in 07/2018. She had surgery and treatment for lung cancer in December 2019. Followed by Dr Dwyane Dee in [place] and will follow-up with them every 6 months for now. Last CT scan 2-3 months ago, and they have pushed out next follow-up until 12/2020  Last saw Oncology 06/11/20 - Ancil Boozer FNP [facility]), ultimately decision made that it has been 2 years since date of diagnosis, she has been treated and now without evidence of recurrence on last CT imaging, see scan results below, she now will f/u every 6 months with imaging and labs.  Hypertension HYPERLIPIDEMIA Coronary Atherosclerosis with calcification / CAD History of Moderate Aortic Valve Insufficiency Followed by Cardiologist through [place] after her surgery. - Due for lipids, last 2020. She was on half of Rosuvastatin 20mg , now last visit with me we reduced to 10mg  daliy - Currently taking FULL DOSE now Rosuvastatin 10mg , tolerating well without side effects or myalgias - Taking Amlodipine 5mg  daily (has not taken today, until after blood work) - Taking Metoprolol tartrate 25mg  BID for cardioprotection  Centrilobular Emphysema COPD History of Tobacco Abuse, former smoker She has done spirometry was told mild obstructive lung  disease or mild COPD Not using any albuterol currently has used in past. Not on maintenance therapy either. Doing well.  Generalized Anxiety Disorder Major Depression, chronic recurrent mild - partial remission Chronic history 10+ years in past, has had primarily issues with generalized anxiety disorder  Left Shoulder Bursitis - RESOLVED  Health Maintenance:  Completed Pap smear per GYN, results below. NILM and Negative HPV.  Breast CA Screening: Due for mammogram screening. Last mammogram result bi rads 1 negative on TOMO 3D 09/26/19 done at [facility]. No prior history abnormal mammogram. No known family history of breast cancer. Currently asymptomatic. - Ordered last visit, she will call to schedule [facility].  Colon CA Screening: Never had colonoscopy. Currently asymptomatic. No known family history of colon CA. Due for screening test - Cologuard was ordered initial visit - she has completed this test, last week Thursday, now 1 week later waiting on results.  UTD COVID19 vaccine, 02/21/20 and    Depression screen PHQ 2/9 07/13/2020  Decreased Interest 1  Down, Depressed, Hopeless 1  PHQ - 2 Score 2  Altered sleeping 3  Tired, decreased energy 1  Change in appetite 1  Feeling bad or failure about yourself  0  Trouble concentrating 0  Moving slowly or fidgety/restless 0  Suicidal thoughts 0  PHQ-9 Score 7  Difficult doing work/chores Somewhat difficult    Past Medical History:  Diagnosis Date  . Cancer ([place])    lung  . Chronic left shoulder pain    Past Surgical History:  Procedure Laterality Date  . LUNG SURGERY     Social History   Socioeconomic History  . Marital status: Single    Spouse  name: Not on file  . Number of children: Not on file  . Years of education: Not on file  . Highest education level: Not on file  Occupational History  . Not on file  Tobacco Use  . Smoking status: Former Smoker    Packs/day: 1.00    Years: 35.00    Pack years:  35.00    Types: Cigarettes  . Smokeless tobacco: Former Systems developer    Quit date: 06/04/2018  Substance and Sexual Activity  . Alcohol use: Yes  . Drug use: Never  . Sexual activity: Not on file  Other Topics Concern  . Not on file  Social History Narrative  . Not on file   Social Determinants of Health   Financial Resource Strain:   . Difficulty of Paying Living Expenses: Not on file  Food Insecurity:   . Worried About Charity fundraiser in the Last Year: Not on file  . Ran Out of Food in the Last Year: Not on file  Transportation Needs:   . Lack of Transportation (Medical): Not on file  . Lack of Transportation (Non-Medical): Not on file  Physical Activity:   . Days of Exercise per Week: Not on file  . Minutes of Exercise per Session: Not on file  Stress:   . Feeling of Stress : Not on file  Social Connections:   . Frequency of Communication with Friends and Family: Not on file  . Frequency of Social Gatherings with Friends and Family: Not on file  . Attends Religious Services: Not on file  . Active Member of Clubs or Organizations: Not on file  . Attends Archivist Meetings: Not on file  . Marital Status: Not on file  Intimate Partner Violence:   . Fear of Current or Ex-Partner: Not on file  . Emotionally Abused: Not on file  . Physically Abused: Not on file  . Sexually Abused: Not on file   Family History  Problem Relation Age of Onset  . Stomach cancer Mother 83  . Lung cancer Father 56   Current Outpatient Medications on File Prior to Visit  Medication Sig  . albuterol (VENTOLIN HFA) 108 (90 Base) MCG/ACT inhaler Inhale 2 puffs into the lungs every 4 (four) hours as needed for wheezing or shortness of breath.  Marland Kitchen amLODipine (NORVASC) 5 MG tablet Take 1 tablet (5 mg total) by mouth daily.  Marland Kitchen aspirin EC 81 MG tablet Take 81 mg by mouth daily. Swallow whole.  . citalopram (CELEXA) 20 MG tablet Take 20 mg by mouth daily.  . cyanocobalamin 1000 MCG tablet Take  1,000 mcg by mouth daily.  . cyclobenzaprine (FLEXERIL) 10 MG tablet Take 0.5-1 tablets (5-10 mg total) by mouth 2 (two) times daily as needed for muscle spasms.  . metoprolol tartrate (LOPRESSOR) 25 MG tablet Take 25 mg by mouth 2 (two) times daily.  . rosuvastatin (CRESTOR) 10 MG tablet Take 1 tablet (10 mg total) by mouth at bedtime.   No current facility-administered medications on file prior to visit.    Review of Systems  Constitutional: Negative for activity change, appetite change, chills, diaphoresis, fatigue and fever.  HENT: Negative for congestion and hearing loss.   Eyes: Negative for visual disturbance.  Respiratory: Negative for cough, chest tightness, shortness of breath and wheezing.   Cardiovascular: Negative for chest pain, palpitations and leg swelling.  Gastrointestinal: Negative for abdominal pain, constipation, diarrhea, nausea and vomiting.  Endocrine: Negative for cold intolerance.  Genitourinary: Negative for dysuria,  frequency and hematuria.  Musculoskeletal: Negative for arthralgias and neck pain.  Skin: Negative for rash.  Allergic/Immunologic: Negative for environmental allergies.  Neurological: Negative for dizziness, weakness, light-headedness, numbness and headaches.  Hematological: Negative for adenopathy.  Psychiatric/Behavioral: Negative for behavioral problems, dysphoric mood and sleep disturbance.   Per HPI unless specifically indicated above      Objective:    BP (!) 141/75   Pulse 86   Temp (!) 96.9 F (36.1 C) (Temporal)   Resp 16   Ht 5\' 2"  (1.575 m)   Wt 209 lb (94.8 kg)   SpO2 100%   BMI 38.23 kg/m   Wt Readings from Last 3 Encounters:  08/13/20 209 lb (94.8 kg)  07/29/20 209 lb 12.8 oz (95.2 kg)  07/13/20 204 lb 12.8 oz (92.9 kg)    Physical Exam Vitals and nursing note reviewed.  Constitutional:      General: She is not in acute distress.    Appearance: She is well-developed. She is not diaphoretic.     Comments:  Well-appearing, comfortable, cooperative  HENT:     Head: Normocephalic and atraumatic.  Eyes:     General:        Right eye: No discharge.        Left eye: No discharge.     Conjunctiva/sclera: Conjunctivae normal.     Pupils: Pupils are equal, round, and reactive to light.  Neck:     Thyroid: No thyromegaly.  Cardiovascular:     Rate and Rhythm: Normal rate and regular rhythm.     Heart sounds: Normal heart sounds. No murmur heard.   Pulmonary:     Effort: Pulmonary effort is normal. No respiratory distress.     Breath sounds: Normal breath sounds. No wheezing or rales.  Abdominal:     General: Bowel sounds are normal. There is no distension.     Palpations: Abdomen is soft. There is no mass.     Tenderness: There is no abdominal tenderness.  Musculoskeletal:        General: No tenderness. Normal range of motion.     Cervical back: Normal range of motion and neck supple.     Comments: Upper / Lower Extremities: - Normal muscle tone, strength bilateral upper extremities 5/5, lower extremities 5/5  Lymphadenopathy:     Cervical: No cervical adenopathy.  Skin:    General: Skin is warm and dry.     Findings: No erythema or rash.  Neurological:     Mental Status: She is alert and oriented to person, place, and time.     Comments: Distal sensation intact to light touch all extremities  Psychiatric:        Behavior: Behavior normal.     Comments: Well groomed, good eye contact, normal speech and thoughts      Prior results, Stress Echo 09/2018: EF 65-70%, moderate to moderately severe AR, mild MR, trace TR.   Results for orders placed or performed in visit on 08/13/20  CBC with Differential/Platelet  Result Value Ref Range   WBC 5.0 3.8 - 10.8 Thousand/uL   RBC 4.33 3.80 - 5.10 Million/uL   Hemoglobin 13.8 11.7 - 15.5 g/dL   HCT 41.5 35 - 45 %   MCV 95.8 80.0 - 100.0 fL   MCH 31.9 27.0 - 33.0 pg   MCHC 33.3 32.0 - 36.0 g/dL   RDW 13.7 11.0 - 15.0 %   Platelets 240 140  - 400 Thousand/uL   MPV 10.5 7.5 - 12.5  fL   Neutro Abs 1,940 1,500 - 7,800 cells/uL   Lymphs Abs 2,540 850 - 3,900 cells/uL   Absolute Monocytes 420 200 - 950 cells/uL   Eosinophils Absolute 50 15 - 500 cells/uL   Basophils Absolute 50 0 - 200 cells/uL   Neutrophils Relative % 38.8 %   Total Lymphocyte 50.8 %   Monocytes Relative 8.4 %   Eosinophils Relative 1.0 %   Basophils Relative 1.0 %      Assessment & Plan:   Problem List Items Addressed This Visit    Pure hypercholesterolemia    Previously Controlled cholesterol on statin and lifestyle Had myalgia on statin Last lipid panel 2020 Known CAD  Plan: Check fasting lipid today 1. Continue current meds - Rosuvastatin 10mg  nightly 2. Continue ASA 81mg  for primary ASCVD risk reduction 3. Encourage improved lifestyle - low carb/cholesterol, reduce portion size, continue improving regular exercise      Relevant Orders   Lipid panel   TSH   Morbid obesity ([place])    Secondary to BMI >38 with co morbid conditions, CAD, Hypertension, Depression, Hyperlipidemia  Encourage lifestyle diet exercise      Relevant Orders   Hemoglobin A1c   Lipid panel   HTN (hypertension)    Mild elevated BP today, did not take medication  yet - Home BP readings reviewed  Complication CAD   Plan:  1. Continue current BP regimen Amlodipine 5mg , Metoprolol 25mg  BID 2. Encourage improved lifestyle - low sodium diet, regular exercise 3. Continue monitor BP outside office, bring readings to next visit, if persistently >140/90 or new symptoms notify office sooner      Relevant Orders   Hemoglobin A1c   CBC with Differential/Platelet (Completed)    Other Visit Diagnoses    Annual physical exam    -  Primary   Relevant Orders   Hemoglobin A1c   CBC with Differential/Platelet (Completed)   Lipid panel   COMPLETE METABOLIC PANEL WITH GFR   Need for hepatitis C screening test       Relevant Orders   Hepatitis C antibody      Updated  Health Maintenance information Ordered fasting labs, f/u results Encouraged improvement to lifestyle with diet and exercise - Goal of weight loss   No orders of the defined types were placed in this encounter.    Follow up plan: Return in about 6 months (around 02/10/2021) for 6 month follow-up HTN, Anxiety, COPD, specialist review.  Nobie Putnam, [facility] 08/13/2020, 8:27 AM

## 2020-08-13 NOTE — Assessment & Plan Note (Addendum)
Secondary to BMI >38 with co morbid conditions, CAD, Hypertension, Depression, Hyperlipidemia  Encourage lifestyle diet exercise

## 2020-08-13 NOTE — Assessment & Plan Note (Signed)
Mild elevated BP today, did not take medication  yet - Home BP readings reviewed  Complication CAD   Plan:  1. Continue current BP regimen Amlodipine 5mg , Metoprolol 25mg  BID 2. Encourage improved lifestyle - low sodium diet, regular exercise 3. Continue monitor BP outside office, bring readings to next visit, if persistently >140/90 or new symptoms notify office sooner

## 2020-08-13 NOTE — Patient Instructions (Addendum)
Thank you for coming to the office today.  For Mammogram screening for breast cancer   Call the [place] below anytime to schedule your own appointment now that order has been placed.  [facility] [place], [place] [address] Phone: [phone]  Give [place] about 1-2 weeks on the Cologuard, still waiting to receive it. Someone will call you with results.  -------------------  Debrox Ear Wax Kit -drops and bulb syringe to flush it out - RIGHT EAR ONLY   Please schedule a Follow-up Appointment to: Return in about 6 months (around 02/10/2021) for 6 month follow-up HTN, Anxiety, COPD, specialist review.  If you have any other questions or concerns, please feel free to call the office or send a message through [place]. You may also schedule an earlier appointment if necessary.  Additionally, you may be receiving a survey about your experience at our office within a few days to 1 week by e-mail or mail. We value your feedback.  Nobie Putnam, DO [place]

## 2020-08-13 NOTE — Assessment & Plan Note (Signed)
Previously Controlled cholesterol on statin and lifestyle Had myalgia on statin Last lipid panel 2020 Known CAD  Plan: Check fasting lipid today 1. Continue current meds - Rosuvastatin 10mg  nightly 2. Continue ASA 81mg  for primary ASCVD risk reduction 3. Encourage improved lifestyle - low carb/cholesterol, reduce portion size, continue improving regular exercise

## 2020-08-19 LAB — CBC WITH DIFFERENTIAL/PLATELET
Absolute Monocytes: 420 cells/uL (ref 200–950)
Basophils Absolute: 50 cells/uL (ref 0–200)
Basophils Relative: 1 %
Eosinophils Absolute: 50 cells/uL (ref 15–500)
Eosinophils Relative: 1 %
HCT: 41.5 % (ref 35.0–45.0)
Hemoglobin: 13.8 g/dL (ref 11.7–15.5)
Lymphs Abs: 2540 cells/uL (ref 850–3900)
MCH: 31.9 pg (ref 27.0–33.0)
MCHC: 33.3 g/dL (ref 32.0–36.0)
MCV: 95.8 fL (ref 80.0–100.0)
MPV: 10.5 fL (ref 7.5–12.5)
Monocytes Relative: 8.4 %
Neutro Abs: 1940 cells/uL (ref 1500–7800)
Neutrophils Relative %: 38.8 %
Platelets: 240 10*3/uL (ref 140–400)
RBC: 4.33 10*6/uL (ref 3.80–5.10)
RDW: 13.7 % (ref 11.0–15.0)
Total Lymphocyte: 50.8 %
WBC: 5 10*3/uL (ref 3.8–10.8)

## 2020-08-19 LAB — COMPLETE METABOLIC PANEL WITH GFR
AG Ratio: 1.6 (calc) (ref 1.0–2.5)
ALT: 12 U/L (ref 6–29)
AST: 14 U/L (ref 10–35)
Albumin: 4.1 g/dL (ref 3.6–5.1)
Alkaline phosphatase (APISO): 86 U/L (ref 37–153)
BUN: 9 mg/dL (ref 7–25)
CO2: 27 mmol/L (ref 20–32)
Calcium: 9.4 mg/dL (ref 8.6–10.4)
Chloride: 103 mmol/L (ref 98–110)
Creat: 0.78 mg/dL (ref 0.50–1.05)
GFR, Est African American: 99 mL/min/{1.73_m2} (ref >=60)
GFR, Est Non African American: 86 mL/min/{1.73_m2} (ref >=60)
Globulin: 2.6 g/dL (calc) (ref 1.9–3.7)
Glucose, Bld: 89 mg/dL (ref 65–99)
Potassium: 4.3 mmol/L (ref 3.5–5.3)
Sodium: 141 mmol/L (ref 135–146)
Total Bilirubin: 0.4 mg/dL (ref 0.2–1.2)
Total Protein: 6.7 g/dL (ref 6.1–8.1)

## 2020-08-19 LAB — LIPID PANEL
Cholesterol: 253 mg/dL — ABNORMAL HIGH (ref <200)
HDL: 61 mg/dL (ref >=50)
LDL Cholesterol (Calc): 155 mg/dL (calc) — ABNORMAL HIGH
Non-HDL Cholesterol (Calc): 192 mg/dL (calc) — ABNORMAL HIGH (ref <130)
Total CHOL/HDL Ratio: 4.1 (calc) (ref <5.0)
Triglycerides: 205 mg/dL — ABNORMAL HIGH (ref <150)

## 2020-08-19 LAB — HEPATITIS C ANTIBODY
Hepatitis C Ab: REACTIVE — AB
SIGNAL TO CUT-OFF: 1.5 — ABNORMAL HIGH (ref <1.00)

## 2020-08-19 LAB — HCV RNA,QUANTITATIVE REAL TIME PCR
HCV Quantitative Log: <1.18 Log IU/mL
HCV RNA, PCR, QN: <15 IU/mL

## 2020-08-19 LAB — HEMOGLOBIN A1C
Hgb A1c MFr Bld: 5.6 % of total Hgb (ref <5.7)
Mean Plasma Glucose: 114 (calc)
eAG (mmol/L): 6.3 (calc)

## 2020-08-19 LAB — TSH: TSH: 0.86 mIU/L

## 2020-08-21 ENCOUNTER — Encounter: Payer: Self-pay | Admitting: Family Medicine

## 2020-09-21 ENCOUNTER — Ambulatory Visit: Payer: Medicaid Other | Attending: Internal Medicine

## 2020-09-21 DIAGNOSIS — Z23 Encounter for immunization: Secondary | ICD-10-CM

## 2020-09-21 NOTE — Progress Notes (Signed)
   Covid-19 Vaccination Clinic  Name:  Chudney Scheffler    MRN: [id number] DOB: 1966/02/12  09/21/2020  Ms. Neidhardt was observed post Covid-19 immunization for 15 minutes without incident. She was provided with Vaccine Information Sheet and instruction to access the V-Safe system.   Ms. Emmer was instructed to call 911 with any severe reactions post vaccine: Marland Kitchen Difficulty breathing  . Swelling of face and throat  . A fast heartbeat  . A bad rash all over body  . Dizziness and weakness

## 2020-11-05 ENCOUNTER — Other Ambulatory Visit: Payer: Self-pay | Admitting: Family Medicine

## 2020-11-05 DIAGNOSIS — G8929 Other chronic pain: Secondary | ICD-10-CM

## 2020-11-05 DIAGNOSIS — M25512 Pain in left shoulder: Secondary | ICD-10-CM

## 2020-11-05 DIAGNOSIS — M62838 Other muscle spasm: Secondary | ICD-10-CM

## 2020-11-05 MED ORDER — CYCLOBENZAPRINE HCL 10 MG PO TABS: 5.0000 mg | ORAL_TABLET | Freq: Two times a day (BID) | ORAL | 2 refills | Status: DC | PRN

## 2020-11-05 NOTE — Telephone Encounter (Signed)
Requested medication (s) are due for refill today:yes  Requested medication (s) are on the active medication list: yes  Last refill:  07/13/2020  Future visit scheduled: yes   Notes to clinic: this refill cannot be delegated  Patient would like to make sure she get a 30 day supply instead of 15    Requested Prescriptions  Pending Prescriptions Disp Refills   cyclobenzaprine (FLEXERIL) 10 MG tablet 30 tablet 2    Sig: Take 0.5-1 tablets (5-10 mg total) by mouth 2 (two) times daily as needed for muscle spasms.      Not Delegated - Analgesics:  Muscle Relaxants Failed - 11/05/2020  9:19 AM      Failed - This refill cannot be delegated      Passed - Valid encounter within last 6 months    Recent Outpatient Visits           2 months ago Annual physical exam   [place], DO   3 months ago Pure hypercholesterolemia   [place], DO       Future Appointments             In 3 months [place], Devonne Doughty, [facility], [facility]

## 2020-11-05 NOTE — Telephone Encounter (Signed)
Medication Refill - Medication: Cyclobenzaprine   Has the patient contacted their pharmacy? Yes.   PT states that the last time she was only given a 15 day supply. She is requesting to have a 30 day supply instead. Please advise.  (Agent: If no, request that the patient contact the pharmacy for the refill.) (Agent: If yes, when and what did the pharmacy advise?)  Preferred Pharmacy (with phone number or street name):  [facility], [place] - [address] [facility]  [address] [place] [address]  Phone: [phone] Fax: [phone]  Hours: Not open 24 hours     Agent: Please be advised that RX refills may take up to 3 business days. We ask that you follow-up with your pharmacy.

## 2020-12-29 ENCOUNTER — Ambulatory Visit
Admission: RE | Admit: 2020-12-29 | Discharge: 2020-12-29 | Disposition: A | Discharge status: Home / Self Care | Payer: Medicaid Other | Source: Ambulatory Visit | Attending: Family Medicine | Admitting: Family Medicine

## 2020-12-29 ENCOUNTER — Other Ambulatory Visit: Payer: Self-pay

## 2020-12-29 DIAGNOSIS — Z1231 Encounter for screening mammogram for malignant neoplasm of breast: Secondary | ICD-10-CM

## 2021-01-05 ENCOUNTER — Other Ambulatory Visit: Payer: Self-pay | Admitting: Family Medicine

## 2021-01-05 DIAGNOSIS — I10 Essential (primary) hypertension: Secondary | ICD-10-CM

## 2021-01-05 DIAGNOSIS — Z85118 Personal history of other malignant neoplasm of bronchus and lung: Secondary | ICD-10-CM | POA: Diagnosis not present

## 2021-01-07 ENCOUNTER — Other Ambulatory Visit: Payer: Self-pay | Admitting: Family Medicine

## 2021-01-07 DIAGNOSIS — E7800 Pure hypercholesterolemia, unspecified: Secondary | ICD-10-CM

## 2021-01-07 DIAGNOSIS — C3490 Malignant neoplasm of unspecified part of unspecified bronchus or lung: Secondary | ICD-10-CM | POA: Diagnosis not present

## 2021-02-10 ENCOUNTER — Encounter: Payer: Self-pay | Admitting: Family Medicine

## 2021-02-10 ENCOUNTER — Ambulatory Visit (INDEPENDENT_AMBULATORY_CARE_PROVIDER_SITE_OTHER): Payer: Medicaid Other | Admitting: Family Medicine

## 2021-02-10 ENCOUNTER — Other Ambulatory Visit: Payer: Self-pay

## 2021-02-10 ENCOUNTER — Other Ambulatory Visit: Payer: Self-pay | Admitting: Family Medicine

## 2021-02-10 VITALS — BP 135/75 | HR 85 | Ht 62.0 in | Wt 224.0 lb

## 2021-02-10 DIAGNOSIS — I10 Essential (primary) hypertension: Secondary | ICD-10-CM

## 2021-02-10 DIAGNOSIS — E7800 Pure hypercholesterolemia, unspecified: Secondary | ICD-10-CM

## 2021-02-10 DIAGNOSIS — F411 Generalized anxiety disorder: Secondary | ICD-10-CM

## 2021-02-10 DIAGNOSIS — J432 Centrilobular emphysema: Secondary | ICD-10-CM

## 2021-02-10 DIAGNOSIS — M7552 Bursitis of left shoulder: Secondary | ICD-10-CM | POA: Diagnosis not present

## 2021-02-10 DIAGNOSIS — G8929 Other chronic pain: Secondary | ICD-10-CM

## 2021-02-10 DIAGNOSIS — R7309 Other abnormal glucose: Secondary | ICD-10-CM

## 2021-02-10 DIAGNOSIS — F3341 Major depressive disorder, recurrent, in partial remission: Secondary | ICD-10-CM

## 2021-02-10 DIAGNOSIS — M25512 Pain in left shoulder: Secondary | ICD-10-CM | POA: Diagnosis not present

## 2021-02-10 DIAGNOSIS — Z0000 Encounter for general adult medical examination without abnormal findings: Secondary | ICD-10-CM

## 2021-02-10 MED ORDER — GABAPENTIN 100 MG PO CAPS: ORAL_CAPSULE | ORAL | 1 refills | Status: DC

## 2021-02-10 MED ORDER — LIDOCAINE HCL (PF) 1 % IJ SOLN
4.0000 mL | Freq: Once | INTRAMUSCULAR | Status: AC
  Administered 2021-02-10: 4 mL

## 2021-02-10 MED ORDER — ROSUVASTATIN CALCIUM 10 MG PO TABS: 10.0000 mg | ORAL_TABLET | Freq: Every day | ORAL | 3 refills | Status: DC

## 2021-02-10 MED ORDER — CITALOPRAM HYDROBROMIDE 10 MG PO TABS: ORAL_TABLET | ORAL | 0 refills | Status: DC

## 2021-02-10 MED ORDER — ESCITALOPRAM OXALATE 10 MG PO TABS: 10.0000 mg | ORAL_TABLET | Freq: Every day | ORAL | 5 refills | Status: DC

## 2021-02-10 MED ORDER — METHYLPREDNISOLONE ACETATE 40 MG/ML IJ SUSP
40.0000 mg | Freq: Once | INTRAMUSCULAR | Status: AC
  Administered 2021-02-10: 40 mg via INTRA_ARTICULAR

## 2021-02-10 MED ORDER — METOPROLOL TARTRATE 25 MG PO TABS: 25.0000 mg | ORAL_TABLET | Freq: Two times a day (BID) | ORAL | 3 refills | Status: DC

## 2021-02-10 MED ORDER — AMLODIPINE BESYLATE 5 MG PO TABS: 5.0000 mg | ORAL_TABLET | Freq: Every day | ORAL | 3 refills | Status: DC

## 2021-02-10 NOTE — Assessment & Plan Note (Signed)
Chronic problem Mostly stable and controlled but feels SSRI less effective on for 15+ years in past Has some mild chronic depression co morbid Related to chronic health problems recent few years  Taper off Citalopram 20mg  New rx Citalopram 10mg  - take daily 1 week, then every other day for 1 week then stop New rx Escitalopram 10mg  daily start AFTER Citalopram is stopped

## 2021-02-10 NOTE — Assessment & Plan Note (Signed)
Controlled on statin Known CAD  Plan: 1. Continue current meds - Rosuvastatin 10mg  nightly 2. Continue ASA 81mg  for primary ASCVD risk reduction 3. Encourage improved lifestyle - low carb/cholesterol, reduce portion size, continue improving regular exercise

## 2021-02-10 NOTE — Assessment & Plan Note (Signed)
Improved HTN control - Home BP readings reviewed  Complication CAD   Plan:  1. Continue current BP regimen Amlodipine 5mg , Metoprolol 25mg  BID - refill meds 2. Encourage improved lifestyle - low sodium diet, regular exercise 3. Continue monitor BP outside office, bring readings to next visit, if persistently >140/90 or new symptoms notify office sooner

## 2021-02-10 NOTE — Assessment & Plan Note (Signed)
See A&P for GAD Chronic recurrent mild partial remission  Switch SSRI, taper off Citalopram and on to Escitalopram

## 2021-02-10 NOTE — Progress Notes (Signed)
Subjective:    Patient ID: Cheryl Payne, female    DOB: March 13, 1966, 55 y.o.   MRN: [id number]  Cheryl Payne is a 55 y.o. female presenting on 02/10/2021 for Hypertension, COPD, and Anxiety   HPI  She has multidisciplinary team currently in [facility] system in [place] that she will plan to keep for surveillance of her lung cancer history. - Dr Roseanne Reno (Cardiothoracic Surgery) - Ancil Boozer FNP (Oncology)  Non Small CellLung Cancer, RIGHT upper S/p chemotherapy/radiation S/pStatus post right upper lobectomy, 11/15/18 Tissue diagnosis (invasive, poorly differentiated adenocarcinoma, visceral pleural invasion, without evidence of lymphovascular invasion or spread) She was diagnosed with Lung Cancer in 07/2018. She had surgery and treatment for lung cancer in December 2019. Followed by Dr Dwyane Dee in [place] and will follow-up with them every 6 months for now.  Interval update Lab work was good, [facility] 01/07/21 Doraine Frederico Hamman FNP ([facility]) - labs and CT imaging 01/2021 showed interval resolution of prior pleural effusion, stable changes after R upper lobectomy, no evidence of recurrent or residual disease.  Hypertension HYPERLIPIDEMIA Coronary Atherosclerosis with calcification/ CAD History ofModerate Aortic Valve Insufficiency Followed by Cardiologist through [facility] after her surgery. - Currently takingFULL DOSE now Rosuvastatin 10mg , tolerating well without side effects or myalgias - Taking Amlodipine 5mg  daily - needs refill - Taking Metoprolol tartrate 25mg  BID for cardioprotection  PMH Centrilobular Emphysema COPD History of Tobacco Abuse, former smoker  Generalized Anxiety Disorder Major Depression, chronic recurrent mild - partial remission Chronic history 15+ years in past, has had primarily issues with generalized anxiety disorder Says mood lately is not as good as it used to be thinks that she is more "immune" to the Citalopram now, never on other med  wants to switch.  Left Shoulder Bursitis Previously treated in 07/2020, had limitation with movement. Previously treated 3-4 years ago. Limited results on Flexeril PRN.   Health Maintenance:  Completed Pap smear per GYN, results below. NILM and Negative HPV.  Depression screen [facility] 2/9 02/10/2021 07/13/2020  Decreased Interest 2 1  Down, Depressed, Hopeless 2 1  PHQ - 2 Score 4 2  Altered sleeping 3 3  Tired, decreased energy 2 1  Change in appetite 1 1  Feeling bad or failure about yourself  0 0  Trouble concentrating 0 0  Moving slowly or fidgety/restless 1 0  Suicidal thoughts 0 0  PHQ-9 Score 11 7  Difficult doing work/chores Somewhat difficult Somewhat difficult   GAD 7 : Generalized Anxiety Score 07/13/2020  Nervous, Anxious, on Edge 2  Control/stop worrying 1  Worry too much - different things 1  Trouble relaxing 2  Restless 2  Easily annoyed or irritable 3  Afraid - awful might happen 0  Total GAD 7 Score 11  Anxiety Difficulty Somewhat difficult     Social History   Tobacco Use  . Smoking status: Former Smoker    Packs/day: 1.00    Years: 35.00    Pack years: 35.00    Types: Cigarettes  . Smokeless tobacco: Former Systems developer    Quit date: 06/04/2018  Substance Use Topics  . Alcohol use: Yes  . Drug use: Never    Review of Systems Per HPI unless specifically indicated above     Objective:    BP 135/75   Pulse 85   Ht 5\' 2"  (1.575 m)   Wt 224 lb (101.6 kg)   SpO2 100%   BMI 40.97 kg/m   Wt Readings from Last 3 Encounters:  02/10/21 224  lb (101.6 kg)  08/13/20 209 lb (94.8 kg)  07/29/20 209 lb 12.8 oz (95.2 kg)    Physical Exam Vitals and nursing note reviewed.  Constitutional:      General: She is not in acute distress.    Appearance: She is well-developed and well-nourished. She is not diaphoretic.     Comments: Well-appearing, comfortable, cooperative  HENT:     Head: Normocephalic and atraumatic.     Mouth/Throat:     Mouth: Oropharynx is  clear and moist.  Eyes:     General:        Right eye: No discharge.        Left eye: No discharge.     Conjunctiva/sclera: Conjunctivae normal.  Cardiovascular:     Rate and Rhythm: Normal rate.  Pulmonary:     Effort: Pulmonary effort is normal.  Musculoskeletal:        General: No edema.     Comments: LEFT Shoulder Inspection: Normal appearance bilateral symmetrical Palpation: Non-tender to palpation over anterior, lateral, or posterior shoulder  ROM: Mild reduced active ROM abduction Special Testing: Rotator cuff testing negative for weakness with supraspinatus full can and mild pain empty can test, Impingement testing POSITIVE. Strength: Normal strength 5/5 flex/ext, ext rot / int rot, grip, rotator cuff str testing. Neurovascular: Distally intact pulses, sensation to light touch   Skin:    General: Skin is warm and dry.     Findings: No erythema or rash.  Neurological:     Mental Status: She is alert and oriented to person, place, and time.  Psychiatric:        Mood and Affect: Mood and affect normal.        Behavior: Behavior normal.     Comments: Well groomed, good eye contact, normal speech and thoughts      ________________________________________________________ PROCEDURE NOTE Date: 02/10/21 Left Subacromial Shoulder injection Discussed benefits and risks (including pain, bleeding, infection, steroid flare). Verbal consent given by patient. Medication:  1 cc Depo-medrol 40mg  and 4 cc Lidocaine 1% without epi Time Out taken  Landmarks identified. Area cleansed with alcohol wipes. Using 21 gauge and 1, 1/2 inch needle, Left subacromial bursa space was injected (with above listed medication) via posterior approach cold spray used for superficial anesthetic. Sterile bandage placed. Patient tolerated procedure well without bleeding or paresthesias. No complications.   Results for orders placed or performed in visit on 08/21/20  Cologuard  Result Value Ref Range    Cologuard Negative Negative      Assessment & Plan:   Problem List Items Addressed This Visit    Pure hypercholesterolemia    Controlled on statin Known CAD  Plan: 1. Continue current meds - Rosuvastatin 10mg  nightly 2. Continue ASA 81mg  for primary ASCVD risk reduction 3. Encourage improved lifestyle - low carb/cholesterol, reduce portion size, continue improving regular exercise      Relevant Medications   amLODipine (NORVASC) 5 MG tablet   metoprolol tartrate (LOPRESSOR) 25 MG tablet   rosuvastatin (CRESTOR) 10 MG tablet   Major depressive disorder, recurrent, in partial remission ([place])    See A&P for GAD Chronic recurrent mild partial remission  Switch SSRI, taper off Citalopram and on to Escitalopram      Relevant Medications   citalopram (CELEXA) 10 MG tablet   escitalopram (LEXAPRO) 10 MG tablet   GAD (generalized anxiety disorder)    Chronic problem Mostly stable and controlled but feels SSRI less effective on for 15+ years in past Has some  mild chronic depression co morbid Related to chronic health problems recent few years  Taper off Citalopram 20mg  New rx Citalopram 10mg  - take daily 1 week, then every other day for 1 week then stop New rx Escitalopram 10mg  daily start AFTER Citalopram is stopped      Relevant Medications   citalopram (CELEXA) 10 MG tablet   escitalopram (LEXAPRO) 10 MG tablet   Essential hypertension    Improved HTN control - Home BP readings reviewed  Complication CAD   Plan:  1. Continue current BP regimen Amlodipine 5mg , Metoprolol 25mg  BID - refill meds 2. Encourage improved lifestyle - low sodium diet, regular exercise 3. Continue monitor BP outside office, bring readings to next visit, if persistently >140/90 or new symptoms notify office sooner      Relevant Medications   amLODipine (NORVASC) 5 MG tablet   metoprolol tartrate (LOPRESSOR) 25 MG tablet   rosuvastatin (CRESTOR) 10 MG tablet   Chronic left shoulder pain    Relevant Medications   gabapentin (NEURONTIN) 100 MG capsule   citalopram (CELEXA) 10 MG tablet   escitalopram (LEXAPRO) 10 MG tablet    Other Visit Diagnoses    Chronic bursitis of left shoulder    -  Primary   Relevant Medications   lidocaine (PF) (XYLOCAINE) 1 % injection 4 mL (Completed)   methylPREDNISolone acetate (DEPO-MEDROL) injection 40 mg (Completed)   gabapentin (NEURONTIN) 100 MG capsule     Consistent with subacute on chronic L-shoulder bursitis vs rotator cuff tendinopathy with some reduced active ROM but without significant evidence of muscle tear (no weakness).  No clear etiology of injury.  likely underlying arthritis - No imaging on chart  Plan: Left shoulder subacromial steroid injection performed today, see procedure note for details.  1. Stop Flexeril. Start gabapentin titration 2. May take Tylenol Ex Str 1-2 q 6 hr PRN 3. Topical Diclofenac PRN 4. Relative rest but keep shoulder mobile, demonstrated ROM exercises, avoid heavy lifting 5. May try heating pad PRN  Follow-up 4-6 weeks if not improved for re-evaluation, consider referral to Physical Therapy, Xray vs Ortho     Meds ordered this encounter  Medications  . amLODipine (NORVASC) 5 MG tablet    Sig: Take 1 tablet (5 mg total) by mouth daily.    Dispense:  90 tablet    Refill:  3  . metoprolol tartrate (LOPRESSOR) 25 MG tablet    Sig: Take 1 tablet (25 mg total) by mouth 2 (two) times daily.    Dispense:  180 tablet    Refill:  3  . rosuvastatin (CRESTOR) 10 MG tablet    Sig: Take 1 tablet (10 mg total) by mouth daily.    Dispense:  90 tablet    Refill:  3  . lidocaine (PF) (XYLOCAINE) 1 % injection 4 mL  . methylPREDNISolone acetate (DEPO-MEDROL) injection 40 mg  . gabapentin (NEURONTIN) 100 MG capsule    Sig: Start 1 capsule daily, increase by 1 cap every 2-3 days as tolerated up to 3 times a day, or may take 3 at once in evening.    Dispense:  90 capsule    Refill:  1     Follow up  plan: Return in about 6 months (around 08/13/2021) for 6 month fasting lab only then 1 week later Annual Physical.  Future labs ordered for 08/17/21   Nobie Putnam, [facility] 02/10/2021, 8:09 AM

## 2021-02-10 NOTE — Patient Instructions (Addendum)
Thank you for coming to the office today.  You received a Left Shoulder Joint steroid injection today. - Lidocaine numbing medicine may ease the pain initially for a few hours until it wears off - As discussed, you may experience a "steroid flare" this evening or within 24-48 hours, anytime medicine is injected into an inflamed joint it can cause the pain to get worse temporarily - Everyone responds differently to these injections, it depends on the patient and the severity of the joint problem, it may provide anywhere from days to weeks, to months of relief. Ideal response is >6 months relief - Try to take it easy for next 1-2 days, avoid over activity and strain on joint (limit lifting for shoulder) - Recommend the following:   - For swelling - rest, compression sleeve / ACE wrap, elevation, and ice packs as needed for first few days   - For pain in future may use heating pad or moist heat as needed  Medication - Try topical Voltaren (Diclofenac) OTC - use 1-4 times a day, it is an anti inflammatory, so use this instead of aleve ibuprofen advil.  Start Gabapentin 100mg  capsules, take at night for 2-3 nights only, and then increase to 2 times a day for a few days, and then may increase to 3 times a day, it may make you drowsy, if helps significantly at night only, then you can increase instead to 3 capsules at night, instead of 3 times a day - In the future if needed, we can significantly increase the dose if tolerated well, some common doses are 300mg  three times a day up to 600mg  three times a day, usually it takes several weeks or months to get to higher doses   Recommend to start taking Tylenol Extra Strength 500mg  tabs - take 1 to 2 tabs per dose (max 1000mg ) every 6-8 hours for pain (take regularly, don't skip a dose for next 7 days), max 24 hour daily dose is 6 tablets or 3000mg . In the future you can repeat the same everyday Tylenol course for 1-2 weeks at a time.    Given the duration  of your pain/injury, I am concerned if the injection - we may refer to Physical Therapy or Orthopedics if not improving as expected   DUE for FASTING BLOOD WORK (no food or drink after midnight before the lab appointment, only water or coffee without cream/sugar on the morning of)  SCHEDULE "Lab Only" visit in the morning at the clinic for lab draw in 6 MONTHS   - Make sure Lab Only appointment is at about 1 week before your next appointment, so that results will be available  For Lab Results, once available within 2-3 days of blood draw, you can can log in to MyChart online to view your results and a brief explanation. Also, we can discuss results at next follow-up visit.   Please schedule a Follow-up Appointment to: Return in about 6 months (around 08/13/2021) for 6 month fasting lab only then 1 week later Annual Physical.  If you have any other questions or concerns, please feel free to call the office or send a message through [place]. You may also schedule an earlier appointment if necessary.  Additionally, you may be receiving a survey about your experience at our office within a few days to 1 week by e-mail or mail. We value your feedback.  Nobie Putnam, DO [facility], [facility]  Range of Motion Shoulder Exercises  Pendulum Circles -  Lean with your good arm against a counter or table for support - Bend forward with a wide stance (make sure your body is comfortable) - Your painful shoulder should hang down and feel "heavy" - Gently move your painful arm in small circles "clockwise" for several turns - Switch to "counterclockwise" for several turns - Early on keep circles narrow and move slowly - Later in rehab, move in larger circles and faster movement   Wall Crawl - Stand close (about 1-2 ft away) to a wall, facing it directly - Reach out with your arm of painful shoulder and place fingers (not palm) on wall - You should make contact with wall at your  waist level - Slowly walk your fingers up the wall. Stay in contact with wall entire time, do not remove fingers - Keep walking fingers up wall until you reach shoulder level - You may feel tightening or mild discomfort, once you reach a height that causes pain or if you are already above your shoulder height then stop. Repeat from starting position. - Early on stand closer to wall, move fingers slowly, and stay at or below shoulder level - Later in rehab, stand farther away from wall (fingertips), move fingers quicker, go above shoulder level

## 2021-04-05 ENCOUNTER — Other Ambulatory Visit: Payer: Self-pay | Admitting: Family Medicine

## 2021-04-05 DIAGNOSIS — M7552 Bursitis of left shoulder: Secondary | ICD-10-CM

## 2021-04-05 DIAGNOSIS — G8929 Other chronic pain: Secondary | ICD-10-CM

## 2021-04-05 DIAGNOSIS — M25512 Pain in left shoulder: Secondary | ICD-10-CM

## 2021-05-06 ENCOUNTER — Telehealth: Payer: Self-pay

## 2021-05-06 NOTE — Telephone Encounter (Signed)
Copied from [place] [id number]. Topic: Appointment Scheduling - Scheduling Inquiry for Clinic >> May 05, 2021 10:28 AM Valere Dross wrote: Reason for CRM: Doroteo Bradford from [facility]- [phone], called in to get the patient scheduled a medicare wellness visit. >> May 06, 2021 12:12 PM Holley Dexter N wrote: Patient called in herself to see about getting medicare wellness visit appt scheduled. Please advise

## 2021-05-14 DIAGNOSIS — H04123 Dry eye syndrome of bilateral lacrimal glands: Secondary | ICD-10-CM | POA: Diagnosis not present

## 2021-06-14 ENCOUNTER — Emergency Department
Admission: EM | Admit: 2021-06-14 | Discharge: 2021-06-14 | Disposition: A | Discharge status: Home / Self Care | Payer: Medicare Other | Attending: Emergency Medicine | Admitting: Emergency Medicine

## 2021-06-14 ENCOUNTER — Emergency Department: Payer: Medicare Other

## 2021-06-14 ENCOUNTER — Other Ambulatory Visit: Payer: Self-pay

## 2021-06-14 DIAGNOSIS — M5432 Sciatica, left side: Secondary | ICD-10-CM | POA: Diagnosis not present

## 2021-06-14 DIAGNOSIS — Z87891 Personal history of nicotine dependence: Secondary | ICD-10-CM | POA: Insufficient documentation

## 2021-06-14 DIAGNOSIS — Z79899 Other long term (current) drug therapy: Secondary | ICD-10-CM | POA: Diagnosis not present

## 2021-06-14 DIAGNOSIS — Z85118 Personal history of other malignant neoplasm of bronchus and lung: Secondary | ICD-10-CM | POA: Insufficient documentation

## 2021-06-14 DIAGNOSIS — M79605 Pain in left leg: Secondary | ICD-10-CM | POA: Diagnosis not present

## 2021-06-14 DIAGNOSIS — I10 Essential (primary) hypertension: Secondary | ICD-10-CM | POA: Diagnosis not present

## 2021-06-14 DIAGNOSIS — Z7982 Long term (current) use of aspirin: Secondary | ICD-10-CM | POA: Diagnosis not present

## 2021-06-14 MED ORDER — METHOCARBAMOL 750 MG PO TABS: 750.0000 mg | ORAL_TABLET | Freq: Four times a day (QID) | ORAL | 0 refills | Status: AC | PRN

## 2021-06-14 MED ORDER — KETOROLAC TROMETHAMINE 30 MG/ML IJ SOLN
30.0000 mg | Freq: Once | INTRAMUSCULAR | Status: AC
  Administered 2021-06-14: 30 mg via INTRAVENOUS
  Filled 2021-06-14: qty 1

## 2021-06-14 MED ORDER — MELOXICAM 15 MG PO TABS: 15.0000 mg | ORAL_TABLET | Freq: Every day | ORAL | 0 refills | Status: AC

## 2021-06-14 MED ORDER — METHOCARBAMOL 500 MG PO TABS
750.0000 mg | ORAL_TABLET | Freq: Once | ORAL | Status: AC
  Administered 2021-06-14: 750 mg via ORAL
  Filled 2021-06-14: qty 2

## 2021-06-14 MED ORDER — ACETAMINOPHEN 325 MG PO TABS
650.0000 mg | ORAL_TABLET | Freq: Once | ORAL | Status: AC
  Administered 2021-06-14: 650 mg via ORAL
  Filled 2021-06-14: qty 2

## 2021-06-14 NOTE — ED Triage Notes (Signed)
Pt c/o left upper leg pain for the past 6 days, denies injury, states it feels like a muscle spasm

## 2021-06-14 NOTE — ED Provider Notes (Signed)
[facility] Emergency Department Provider Note  ____________________________________________   Event Date/Time   First MD Initiated Contact with Patient 06/14/21 1133     (approximate)  I have reviewed the triage vital signs and the nursing notes.   HISTORY  Chief Complaint Leg Pain   HPI Cheryl Payne is a 55 y.o. female who presents to the emergency department for evaluation of left leg pain.  Patient reports a worsening of the pain over the last 6 to 7 days without any precipitating trauma.  She describes the pain as beginning in her buttock and radiating down her leg into her calf.  She denies any weakness, fevers, loss of bowel or bladder control.  She denies any associated back pain, she denies any history of lumbar spine problems.  No clear alleviating factors, pain is worse with ambulation.         Past Medical History:  Diagnosis Date   Cancer [facility])    lung   Chronic left shoulder pain     Patient Active Problem List   Diagnosis Date Noted   Pure hypercholesterolemia 07/13/2020   Major depressive disorder, recurrent, in partial remission ([place]) 07/13/2020   GAD (generalized anxiety disorder) 07/13/2020   Encounter for screening mammogram for malignant neoplasm of breast 07/13/2020   Muscle spasm of left shoulder 07/13/2020   Chronic left shoulder pain 07/13/2020   Centrilobular emphysema ([place]) 07/13/2020   Moderate aortic valve insufficiency 07/13/2020   Morbid obesity ([place]) 07/13/2020   Non-small cell carcinoma of lung ([place]) 01/02/2019   Essential hypertension 09/02/2014    Past Surgical History:  Procedure Laterality Date   LUNG SURGERY      Prior to Admission medications   Medication Sig Start Date End Date Taking? Authorizing Provider  meloxicam (MOBIC) 15 MG tablet Take 1 tablet (15 mg total) by mouth daily for 15 days. 06/14/21 06/29/21 Yes Leticia Mcdiarmid, Farrel Gordon, PA  methocarbamol (ROBAXIN-750) 750 MG tablet Take 1 tablet (750  mg total) by mouth 4 (four) times daily as needed for up to 10 days for muscle spasms. 06/14/21 06/24/21 Yes Al Bracewell, Farrel Gordon, PA  albuterol (VENTOLIN HFA) 108 (90 Base) MCG/ACT inhaler Inhale 2 puffs into the lungs every 4 (four) hours as needed for wheezing or shortness of breath. 07/13/20   Karamalegos, Devonne Doughty, DO  amLODipine (NORVASC) 5 MG tablet Take 1 tablet (5 mg total) by mouth daily. 02/10/21   Karamalegos, Devonne Doughty, DO  aspirin EC 81 MG tablet Take 81 mg by mouth daily. Swallow whole.    [provider]  citalopram (CELEXA) 10 MG tablet Taper off med. Take 1 (10mg ) daily for 1 week, then take 1 tab every other day for 1 week. Then Stop and Start new med Escitalopram 10mg  daily. 02/10/21   Karamalegos, Devonne Doughty, DO  cyanocobalamin 1000 MCG tablet Take 1,000 mcg by mouth daily.    [provider]  escitalopram (LEXAPRO) 10 MG tablet Take 1 tablet (10 mg total) by mouth daily. Start once finished Citalopram 10mg  dosage taper off. 02/10/21   Karamalegos, Devonne Doughty, DO  gabapentin (NEURONTIN) 100 MG capsule START WITH 1 CAPSULE BY MOUTH DAILY. INC BY 1 CAPSULE EVERY 2 TO 3 DAYS AS TOLERATED UP TO THREE TIMES DAILY. OR MAY TAKE 3 EVERY EVENING 04/05/21   Parks Ranger, Devonne Doughty, DO  metoprolol tartrate (LOPRESSOR) 25 MG tablet Take 1 tablet (25 mg total) by mouth 2 (two) times daily. 02/10/21   [name], DO  rosuvastatin (  CRESTOR) 10 MG tablet Take 1 tablet (10 mg total) by mouth daily. 02/10/21   [name], DO    Allergies Patient has no known allergies.  Family History  Problem Relation Age of Onset   Stomach cancer Mother 86   Breast cancer Mother    Lung cancer Father 67    Social History Social History   Tobacco Use   Smoking status: Former    Packs/day: 1.00    Years: 35.00    Pack years: 35.00    Types: Cigarettes   Smokeless tobacco: Former    Quit date: 06/04/2018  Substance Use Topics   Alcohol use: Yes   Drug use: Never     Review of Systems Constitutional: No fever/chills Eyes: No visual changes. ENT: No sore throat. Cardiovascular: Denies chest pain. Respiratory: Denies shortness of breath. Gastrointestinal: No abdominal pain.  No nausea, no vomiting.  No diarrhea.  No constipation. Genitourinary: Negative for dysuria. Musculoskeletal: Negative for back pain. + Left leg pain Skin: Negative for rash. Neurological: Negative for headaches, focal weakness or numbness.  ____________________________________________   PHYSICAL EXAM:  VITAL SIGNS: ED Triage Vitals [06/14/21 1057]  Enc Vitals Group     BP 137/84     Pulse Rate 84     Resp 17     Temp 98.2 F (36.8 C)     Temp Source Oral     SpO2 95 %     Weight 210 lb (95.3 kg)     Height 5\' 2"  (1.575 m)     Head Circumference      Peak Flow      Pain Score 8     Pain Loc      Pain Edu?      Excl. in [place]?    Constitutional: Alert and oriented. Well appearing and in no acute distress. Eyes: Conjunctivae are normal. PERRL. EOMI. Head: Atraumatic. Nose: No congestion/rhinnorhea. Mouth/Throat: Mucous membranes are moist.  Oropharynx non-erythematous. Neck: No stridor.   Cardiovascular: Normal rate, regular rhythm. Grossly normal heart sounds.  Good peripheral circulation. Respiratory: Normal respiratory effort.  No retractions. Lungs CTAB. Gastrointestinal: Soft and nontender. No distention. No abdominal bruits. No CVA tenderness. Musculoskeletal: No tenderness to palpation of the midline or paraspinals of the lumbar spine.  There is tenderness beginning in the central left buttock, left greater trochanter down the lateral aspect of the leg.  There is calf tenderness, however there is no gross edema, erythema.  She has 5/5 strength in bilateral lower extremities in ankle plantarflexion, dorsiflexion, knee flexion and extension.  Dorsal pedal pulses are 2+ bilaterally with appropriate cap refill. Neurologic:  Normal speech and language. No gross  focal neurologic deficits are appreciated. No gait instability. Skin:  Skin is warm, dry and intact. No rash noted. Psychiatric: Mood and affect are normal. Speech and behavior are normal.  ____________________________________________  RADIOLOGY  Official radiology report(s): US Venous Img Lower Unilateral Left  Result Date: 06/14/2021 CLINICAL DATA:  LEFT leg pain EXAM: LEFT LOWER EXTREMITY VENOUS DOPPLER ULTRASOUND TECHNIQUE: Gray-scale sonography with compression, as well as color and duplex ultrasound, were performed to evaluate the deep venous system(s) from the level of the common femoral vein through the popliteal and proximal calf veins. COMPARISON:  None. FINDINGS: VENOUS Normal compressibility of the common femoral, superficial femoral, and popliteal veins, as well as the visualized calf veins. Visualized portions of profunda femoral vein and great saphenous vein unremarkable. No filling defects to suggest DVT on grayscale or color  Doppler imaging. Doppler waveforms show normal direction of venous flow, normal respiratory plasticity and response to augmentation. Limited views of the contralateral common femoral vein are unremarkable. OTHER None. Limitations: none IMPRESSION: No evidence of LEFT lower extremity deep venous thrombosis. Electronically Signed   By: Suzy Bouchard M.D.   On: 06/14/2021 14:13     ____________________________________________   INITIAL IMPRESSION / ASSESSMENT AND PLAN / ED COURSE  As part of my medical decision making, I reviewed the following data within the [place] notes reviewed and incorporated and Notes from prior ED visits        Patient is a 55 year old female who presents to the emergency department for evaluation of left leg pain.  See HPI for further details.  In triage patient has normal vital signs.  Physical exam as above.  Overall, grossly reassuring physical exam.  In the absence of trauma, differentials include  sciatica, DVT, arthritis, muscle strain.  Given that patient has a documented history of cancer, will perform ultrasound study to rule out DVT.  This was negative.  Feel the patient's pain is likely a result of radiation from the back.  Will initiate course of anti-inflammatory, muscle relaxant and Tylenol.  Patient cautioned on the use of NSAIDs with her existing escitalopram, and increased risk for GI bleeds.  Given that this is a short course of medication, patient is agreeable to taking this as a short course and will present if she develops any complications from medication.  Patient is stable this time for outpatient follow-up.      ____________________________________________   FINAL CLINICAL IMPRESSION(S) / ED DIAGNOSES  Final diagnoses:  Left leg pain  Sciatica of left side     ED Discharge Orders          Ordered    methocarbamol (ROBAXIN-750) 750 MG tablet  4 times daily PRN        06/14/21 1430    meloxicam (MOBIC) 15 MG tablet  Daily        06/14/21 1430             Note:  This document was prepared using Dragon voice recognition software and may include unintentional dictation errors.    Marlana Salvage, PA 06/15/21 1572    Duffy Bruce, MD 06/17/21 [phone]

## 2021-06-14 NOTE — ED Notes (Signed)
See triage note  Presents with left upper leg pain  States pain is mainly posterior   And denies any injury

## 2021-06-14 NOTE — Discharge Instructions (Addendum)
Please take medications as prescribed. You may also take Tylenol, up to 1000mg  4x daily with your other medications. Return to the ER if you experience any worsening, otherwise follow up with primary care.

## 2021-06-28 ENCOUNTER — Other Ambulatory Visit: Payer: Self-pay

## 2021-06-28 ENCOUNTER — Ambulatory Visit (INDEPENDENT_AMBULATORY_CARE_PROVIDER_SITE_OTHER): Payer: Medicare Other | Admitting: Family Medicine

## 2021-06-28 ENCOUNTER — Encounter: Payer: Self-pay | Admitting: Family Medicine

## 2021-06-28 VITALS — BP 138/71 | HR 75 | Ht 62.0 in | Wt 214.8 lb

## 2021-06-28 DIAGNOSIS — Z0000 Encounter for general adult medical examination without abnormal findings: Secondary | ICD-10-CM

## 2021-06-28 NOTE — Patient Instructions (Addendum)
Thank you for coming to the office today.  [place] Part B - Yearly Welcome to Medicare Visit - next year will do this on the phone from a Nurse   Please schedule a Follow-up Appointment to: Return for keeps in September,  add acute visit openings this week for UTI, AM urine then virtual phone.  If you have any other questions or concerns, please feel free to call the office or send a message through [place]. You may also schedule an earlier appointment if necessary.  Additionally, you may be receiving a survey about your experience at our office within a few days to 1 week by e-mail or mail. We value your feedback.  Nobie Putnam, DO [place]

## 2021-06-28 NOTE — Progress Notes (Signed)
Patient: Cheryl Payne, Female    DOB: 04-06-1966, 55 y.o.   MRN: [id number]  Visit Date: 06/28/2021  Today's Provider: Nobie Putnam, DO   Chief Complaint  Patient presents with   Annual Exam    Subjective:   Cheryl Payne is a 55 y.o. female who presents today for "Welcome to Medicare Visit"  Welcome to [employer] Visit:  Health Maintenance: - Vaccine updates - she received COVID19 vaccine 2nd booster recently need to bring [place] copy of card to update  No history of chicken pox, will defer Shingrix  Not due yet for Pneumonia vaccine, until age 29+  Mammogram updated 12/29/20 negative, she plans to go every 2 years.  Chart reviewed and updated. Psychosocial and medical history were reviewed.  Pap Smear completed 07/2020  Cologuard negative 08/2020, good for 3 years.   Past Medical History:  Diagnosis Date   Cancer ([place])    lung   Chronic left shoulder pain     Past Surgical History:  Procedure Laterality Date   LUNG SURGERY      Family History  Problem Relation Age of Onset   Stomach cancer Mother 32   Breast cancer Mother    Lung cancer Father 72    Social History   Socioeconomic History   Marital status: Single    Spouse name: Not on file   Number of children: Not on file   Years of education: Not on file   Highest education level: Not on file  Occupational History   Not on file  Tobacco Use   Smoking status: Former    Packs/day: 1.00    Years: 35.00    Pack years: 35.00    Types: Cigarettes   Smokeless tobacco: Former    Quit date: 06/04/2018  Substance and Sexual Activity   Alcohol use: Yes   Drug use: Never   Sexual activity: Not on file  Other Topics Concern   Not on file  Social History Narrative   Not on file   Social Determinants of Health   Financial Resource Strain: Not on file  Food Insecurity: Not on file  Transportation Needs: Not on file  Physical Activity: Not on file  Stress: Not on file  Social  Connections: Not on file  Intimate Partner Violence: Not on file    Outpatient Encounter Medications as of 06/28/2021  Medication Sig   albuterol (VENTOLIN HFA) 108 (90 Base) MCG/ACT inhaler Inhale 2 puffs into the lungs every 4 (four) hours as needed for wheezing or shortness of breath.   amLODipine (NORVASC) 5 MG tablet Take 1 tablet (5 mg total) by mouth daily.   aspirin EC 81 MG tablet Take 81 mg by mouth daily. Swallow whole.   cyanocobalamin 1000 MCG tablet Take 1,000 mcg by mouth daily.   escitalopram (LEXAPRO) 10 MG tablet Take 1 tablet (10 mg total) by mouth daily. Start once finished Citalopram 10mg  dosage taper off.   meloxicam (MOBIC) 15 MG tablet Take 1 tablet (15 mg total) by mouth daily for 15 days.   rosuvastatin (CRESTOR) 10 MG tablet Take 1 tablet (10 mg total) by mouth daily.   citalopram (CELEXA) 10 MG tablet Taper off med. Take 1 (10mg ) daily for 1 week, then take 1 tab every other day for 1 week. Then Stop and Start new med Escitalopram 10mg  daily. (Patient not taking: Reported on 06/28/2021)   gabapentin (NEURONTIN) 100 MG capsule START WITH 1 CAPSULE BY MOUTH DAILY. INC BY 1 CAPSULE EVERY 2 TO  3 DAYS AS TOLERATED UP TO THREE TIMES DAILY. OR MAY TAKE 3 EVERY EVENING (Patient not taking: Reported on 06/28/2021)   metoprolol tartrate (LOPRESSOR) 25 MG tablet Take 1 tablet (25 mg total) by mouth 2 (two) times daily. (Patient not taking: Reported on 06/28/2021)   No facility-administered encounter medications on file as of 06/28/2021.      Functional Status Survey:    Functional Ability / Safety Screening 1.  Was the timed Get Up and Go test longer than 30 seconds?  no 2.  Does the patient need help with the phone, transportation, shopping,      preparing meals, housework, laundry, medications, or managing money?  no 3.  Does the patient's home have:  loose throw rugs in the hallway?   no      Grab bars in the bathroom? no      Handrails on the stairs?   no      Poor  lighting?   no 4.  Has the patient noticed any hearing difficulties?   no  Fall Risk Assessment Fall Risk  06/28/2021 07/13/2020  Falls in the past year? 0 0  Number falls in past yr: 0 0  Injury with Fall? 0 0  Follow up Falls evaluation completed Falls evaluation completed    Depression Screen See under rooming Depression screen [facility] 2/9 06/28/2021 02/10/2021 07/13/2020  Decreased Interest 0 2 1  Down, Depressed, Hopeless 1 2 1   PHQ - 2 Score 1 4 2   Altered sleeping 3 3 3   Tired, decreased energy 1 2 1   Change in appetite 1 1 1   Feeling bad or failure about yourself  1 0 0  Trouble concentrating 0 0 0  Moving slowly or fidgety/restless 0 1 0  Suicidal thoughts 0 0 0  PHQ-9 Score 7 11 7   Difficult doing work/chores Somewhat difficult Somewhat difficult Somewhat difficult    Advanced Directives Does patient have a HCPOA?    no  If yes, name and contact information:  Does patient have a living will or MOST form?  no  Objective:   Vitals: BP 138/71   Pulse 75   Ht 5\' 2"  (1.575 m)   Wt 214 lb 12.8 oz (97.4 kg)   SpO2 98%   BMI 39.29 kg/m  Body mass index is 39.29 kg/m.  Filed Weights   06/28/21 1505  Weight: 214 lb 12.8 oz (97.4 kg)    No results found.  Physical Exam Vitals and nursing note reviewed.  Constitutional:      General: She is not in acute distress.    Appearance: Normal appearance. She is well-developed. She is not diaphoretic.     Comments: Well-appearing, comfortable, cooperative  HENT:     Head: Normocephalic and atraumatic.  Eyes:     General:        Right eye: No discharge.        Left eye: No discharge.     Conjunctiva/sclera: Conjunctivae normal.  Cardiovascular:     Rate and Rhythm: Normal rate.  Pulmonary:     Effort: Pulmonary effort is normal.  Skin:    General: Skin is warm and dry.     Findings: No erythema or rash.  Neurological:     Mental Status: She is alert and oriented to person, place, and time.  Psychiatric:        Mood  and Affect: Mood normal.        Behavior: Behavior normal.  Thought Content: Thought content normal.     Comments: Well groomed, good eye contact, normal speech and thoughts     No flowsheet data found.  No flowsheet data found.  Cognitive Testing - 6-CIT  No flowsheet data found.   Correct? Score   What year is it? yes 0 Yes = 0    No = 4  What month is it? yes 0 Yes = 0    No = 3  Remember:     Pia Mau, [place], [place]     What time is it? yes 0 Yes = 0    No = 3  Count backwards from 20 to 1 yes 0 Correct = 0    1 error = 2   More than 1 error = 4  Say the months of the year in reverse. yes 0 Correct = 0    1 error = 2   More than 1 error = 4  What address did I ask you to remember? yes 1 Correct = 0  1 error = 2    2 error = 4    3 error = 6    4 error = 8    All wrong = 10       TOTAL SCORE  26/28   Interpretation:  Normal  Normal (0-7) Abnormal (8-28)    Assessment & Plan:    Annual Wellness Visit  Reviewed patient's Family Medical History Reviewed and updated list of patient's medical providers Assessment of cognitive impairment was done Assessed patient's functional ability Established a written schedule for health screening services Health Risk Assessent Completed and Reviewed  Exercise Activities and Dietary recommendations  Goals   None     Immunization History  Administered Date(s) Administered   PFIZER(Purple Top)SARS-COV-2 Vaccination 02/21/2020, 03/13/2020, 09/21/2020    Health Maintenance  Topic Date Due   Pneumococcal Vaccine 38-56 Years old (1 - PCV) Never done   TETANUS/TDAP  Never done   COVID-19 Vaccine (4 - Booster for Pfizer series) 12/22/2020   Zoster Vaccines- Shingrix (1 of 2) 09/28/2021 (Originally 05/21/1984)   HIV Screening  02/11/2024 (Originally 05/21/1980)   INFLUENZA VACCINE  07/05/2021   MAMMOGRAM  12/29/2022   PAP SMEAR-Modifier  07/30/2023   Fecal DNA (Cologuard)  08/07/2023   Hepatitis C Screening   Completed   HPV VACCINES  Aged Out    Discussed health benefits of physical activity, and encouraged her to engage in regular exercise appropriate for her age and condition.   No orders of the defined types were placed in this encounter.   Current Outpatient Medications:    albuterol (VENTOLIN HFA) 108 (90 Base) MCG/ACT inhaler, Inhale 2 puffs into the lungs every 4 (four) hours as needed for wheezing or shortness of breath., Disp: 6.7 g, Rfl: 2   amLODipine (NORVASC) 5 MG tablet, Take 1 tablet (5 mg total) by mouth daily., Disp: 90 tablet, Rfl: 3   aspirin EC 81 MG tablet, Take 81 mg by mouth daily. Swallow whole., Disp: , Rfl:    cyanocobalamin 1000 MCG tablet, Take 1,000 mcg by mouth daily., Disp: , Rfl:    escitalopram (LEXAPRO) 10 MG tablet, Take 1 tablet (10 mg total) by mouth daily. Start once finished Citalopram 10mg  dosage taper off., Disp: 30 tablet, Rfl: 5   meloxicam (MOBIC) 15 MG tablet, Take 1 tablet (15 mg total) by mouth daily for 15 days., Disp: 15 tablet, Rfl: 0   rosuvastatin (CRESTOR) 10 MG tablet,  Take 1 tablet (10 mg total) by mouth daily., Disp: 90 tablet, Rfl: 3   citalopram (CELEXA) 10 MG tablet, Taper off med. Take 1 (10mg ) daily for 1 week, then take 1 tab every other day for 1 week. Then Stop and Start new med Escitalopram 10mg  daily. (Patient not taking: Reported on 06/28/2021), Disp: 30 tablet, Rfl: 0   gabapentin (NEURONTIN) 100 MG capsule, START WITH 1 CAPSULE BY MOUTH DAILY. INC BY 1 CAPSULE EVERY 2 TO 3 DAYS AS TOLERATED UP TO THREE TIMES DAILY. OR MAY TAKE 3 EVERY EVENING (Patient not taking: Reported on 06/28/2021), Disp: 270 capsule, Rfl: 0   metoprolol tartrate (LOPRESSOR) 25 MG tablet, Take 1 tablet (25 mg total) by mouth 2 (two) times daily. (Patient not taking: Reported on 06/28/2021), Disp: 180 tablet, Rfl: 3 There are no discontinued medications.  Next Medicare Wellness Visit in 12+ months   Nobie Putnam, [facility] 06/28/2021, 3:25 PM

## 2021-06-30 ENCOUNTER — Telehealth (INDEPENDENT_AMBULATORY_CARE_PROVIDER_SITE_OTHER): Payer: Medicare Other | Admitting: Family Medicine

## 2021-06-30 ENCOUNTER — Other Ambulatory Visit: Payer: Self-pay

## 2021-06-30 ENCOUNTER — Telehealth: Payer: Medicare Other | Admitting: Family Medicine

## 2021-06-30 ENCOUNTER — Encounter: Payer: Self-pay | Admitting: Family Medicine

## 2021-06-30 VITALS — Ht 62.0 in | Wt 214.0 lb

## 2021-06-30 DIAGNOSIS — B379 Candidiasis, unspecified: Secondary | ICD-10-CM | POA: Diagnosis not present

## 2021-06-30 DIAGNOSIS — R350 Frequency of micturition: Secondary | ICD-10-CM

## 2021-06-30 DIAGNOSIS — N3001 Acute cystitis with hematuria: Secondary | ICD-10-CM | POA: Diagnosis not present

## 2021-06-30 DIAGNOSIS — T3695XA Adverse effect of unspecified systemic antibiotic, initial encounter: Secondary | ICD-10-CM

## 2021-06-30 LAB — POCT URINALYSIS DIPSTICK
Bilirubin, UA: NEGATIVE
Glucose, UA: NEGATIVE
Nitrite, UA: NEGATIVE
Protein, UA: POSITIVE — AB
Spec Grav, UA: >=1.03 — AB (ref 1.010–1.025)
Urobilinogen, UA: 1 E.U./dL
pH, UA: 5 (ref 5.0–8.0)

## 2021-06-30 MED ORDER — CEPHALEXIN 500 MG PO CAPS: 500.0000 mg | ORAL_CAPSULE | Freq: Three times a day (TID) | ORAL | 0 refills | Status: DC

## 2021-06-30 MED ORDER — FLUCONAZOLE 150 MG PO TABS: ORAL_TABLET | ORAL | 0 refills | Status: DC

## 2021-06-30 NOTE — Progress Notes (Signed)
Virtual Visit via Telephone The purpose of this virtual visit is to provide medical care while limiting exposure to the novel coronavirus (COVID19) for both patient and office staff.  Consent was obtained for phone visit:  Yes.   Answered questions that patient had about telehealth interaction:  Yes.   I discussed the limitations, risks, security and privacy concerns of performing an evaluation and management service by telephone. I also discussed with the patient that there may be a patient responsible charge related to this service. The patient expressed understanding and agreed to proceed.  Patient Location: Home Provider Location: [facility] (Office)  Participants in virtual visit: - Patient: [name] - CMA: Orinda Kenner, CMA - Provider: Dr Parks Ranger  ---------------------------------------------------------------------- Chief Complaint  Patient presents with   Urinary Tract Infection    S: Reviewed CMA documentation. I have called patient and gathered additional HPI as follows:  UTI Reports that symptoms started within past 1 week, urinary frequency, dysuria. Similar to prior UTI  Denies any known or suspected exposure to person with or possibly with COVID19.  Denies any fevers, chills, sweats, body ache, cough, shortness of breath, sinus pain or pressure, headache, abdominal pain, diarrhea  Past Medical History:  Diagnosis Date   Cancer ([place])    lung   Chronic left shoulder pain    Social History   Tobacco Use   Smoking status: Former    Packs/day: 1.00    Years: 35.00    Pack years: 35.00    Types: Cigarettes   Smokeless tobacco: Former    Quit date: 06/04/2018  Substance Use Topics   Alcohol use: Yes   Drug use: Never    Current Outpatient Medications:    albuterol (VENTOLIN HFA) 108 (90 Base) MCG/ACT inhaler, Inhale 2 puffs into the lungs every 4 (four) hours as needed for wheezing or shortness of breath., Disp: 6.7 g, Rfl: 2    amLODipine (NORVASC) 5 MG tablet, Take 1 tablet (5 mg total) by mouth daily., Disp: 90 tablet, Rfl: 3   aspirin EC 81 MG tablet, Take 81 mg by mouth daily. Swallow whole., Disp: , Rfl:    cephALEXin (KEFLEX) 500 MG capsule, Take 1 capsule (500 mg total) by mouth 3 (three) times daily. For 7 days, Disp: 21 capsule, Rfl: 0   cyanocobalamin 1000 MCG tablet, Take 1,000 mcg by mouth daily., Disp: , Rfl:    escitalopram (LEXAPRO) 10 MG tablet, Take 1 tablet (10 mg total) by mouth daily. Start once finished Citalopram 10mg  dosage taper off., Disp: 30 tablet, Rfl: 5   fluconazole (DIFLUCAN) 150 MG tablet, Take one tablet by mouth on Day 1. Repeat dose 2nd tablet on Day 3., Disp: 2 tablet, Rfl: 0   rosuvastatin (CRESTOR) 10 MG tablet, Take 1 tablet (10 mg total) by mouth daily., Disp: 90 tablet, Rfl: 3   gabapentin (NEURONTIN) 100 MG capsule, START WITH 1 CAPSULE BY MOUTH DAILY. INC BY 1 CAPSULE EVERY 2 TO 3 DAYS AS TOLERATED UP TO THREE TIMES DAILY. OR MAY TAKE 3 EVERY EVENING (Patient not taking: No sig reported), Disp: 270 capsule, Rfl: 0   metoprolol tartrate (LOPRESSOR) 25 MG tablet, Take 1 tablet (25 mg total) by mouth 2 (two) times daily. (Patient not taking: No sig reported), Disp: 180 tablet, Rfl: 3  Depression screen [facility] 2/9 06/28/2021 02/10/2021 07/13/2020  Decreased Interest 0 2 1  Down, Depressed, Hopeless 1 2 1   PHQ - 2 Score 1 4 2   Altered sleeping 3  3 3  Tired, decreased energy 1 2 1   Change in appetite 1 1 1   Feeling bad or failure about yourself  1 0 0  Trouble concentrating 0 0 0  Moving slowly or fidgety/restless 0 1 0  Suicidal thoughts 0 0 0  PHQ-9 Score 7 11 7   Difficult doing work/chores Somewhat difficult Somewhat difficult Somewhat difficult    GAD 7 : Generalized Anxiety Score 06/28/2021 07/13/2020  Nervous, Anxious, on Edge 1 2  Control/stop worrying 1 1  Worry too much - different things 1 1  Trouble relaxing 1 2  Restless 1 2  Easily annoyed or irritable 1 3  Afraid -  awful might happen 0 0  Total GAD 7 Score 6 11  Anxiety Difficulty Somewhat difficult Somewhat difficult    -------------------------------------------------------------------------- O: No physical exam performed due to remote telephone encounter.  Lab results reviewed.  Recent Results (from the past 2160 hour(s))  POCT Urinalysis Dipstick     Status: Abnormal   Collection Time: 06/30/21  9:10 AM  Result Value Ref Range   Color, UA Orange    Clarity, UA Cloudy    Glucose, UA Negative Negative   Bilirubin, UA Negative    Ketones, UA Trace    Spec Grav, UA >=1.030 (A) 1.010 - 1.025   Blood, UA Trace    pH, UA 5.0 5.0 - 8.0   Protein, UA Positive (A) Negative   Urobilinogen, UA 1.0 0.2 or 1.0 E.U./dL   Nitrite, UA Negative    Leukocytes, UA Trace (A) Negative   Appearance     Odor      -------------------------------------------------------------------------- A&P:  Problem List Items Addressed This Visit   None Visit Diagnoses     Acute cystitis with hematuria    -  Primary   Relevant Medications   cephALEXin (KEFLEX) 500 MG capsule   Urinary frequency       Relevant Orders   POCT Urinalysis Dipstick (Completed)   Urine Culture   Antibiotic-induced yeast infection       Relevant Medications   cephALEXin (KEFLEX) 500 MG capsule   fluconazole (DIFLUCAN) 150 MG tablet      Clinically consistent with UTI and confirmed on UA. No recent UTIs or abx courses.  No concern for pyelo today (no systemic symptoms, neg fever, back pain, n/v).  Plan: 1. UA abnormal dipstick 2. Ordered Urine culture 3. Keflex 500mg  TID x 7 days 4. Improve PO hydration 5. RTC if no improvement 1-2 weeks, red flags given to return sooner   Meds ordered this encounter  Medications   cephALEXin (KEFLEX) 500 MG capsule    Sig: Take 1 capsule (500 mg total) by mouth 3 (three) times daily. For 7 days    Dispense:  21 capsule    Refill:  0   fluconazole (DIFLUCAN) 150 MG tablet    Sig: Take  one tablet by mouth on Day 1. Repeat dose 2nd tablet on Day 3.    Dispense:  2 tablet    Refill:  0    Follow-up: - Return in 1 week if unresolved  Patient verbalizes understanding with the above medical recommendations including the limitation of remote medical advice.  Specific follow-up and call-back criteria were given for patient to follow-up or seek medical care more urgently if needed.   - Time spent in direct consultation with patient on phone: 7 minutes   Nobie Putnam, [facility] 06/30/2021, 11:24 AM

## 2021-06-30 NOTE — Patient Instructions (Addendum)
1. You have a Urinary Tract Infection - this is very common, your symptoms are reassuring and you should get better within 1 week on the antibiotics - Start Keflex 500mg  3 times daily for next 7 days, complete entire course, even if feeling better - We sent urine for a culture, we will call you within next few days if we need to change antibiotics - Please drink plenty of fluids, improve hydration over next 1 week  If symptoms worsening, developing nausea / vomiting, worsening back pain, fevers / chills / sweats, then please return for re-evaluation sooner.  If you take AZO Pyridium OTC - limit this to 2-3 days MAX to avoid affecting kidneys  D-Mannose is a natural supplement that can actually help bind to urinary bacteria and reduce their effectiveness it can help prevent UTI from forming, and may reduce some symptoms. It likely cannot cure an active UTI but it is worth a try and good to prevent them with. Try 500mg  twice a day at a full dose if you want, or check package instructions for more info  Please schedule a Follow-up Appointment to: Return in about 1 week (around 07/07/2021), or if symptoms worsen or fail to improve, for UTI.  If you have any other questions or concerns, please feel free to call the office or send a message through [place]. You may also schedule an earlier appointment if necessary.  Additionally, you may be receiving a survey about your experience at our office within a few days to 1 week by e-mail or mail. We value your feedback.  Nobie Putnam, DO [place]

## 2021-07-01 LAB — URINE CULTURE
MICRO NUMBER:: 12171620
SPECIMEN QUALITY:: ADEQUATE

## 2021-07-06 DIAGNOSIS — Z85118 Personal history of other malignant neoplasm of bronchus and lung: Secondary | ICD-10-CM | POA: Diagnosis not present

## 2021-07-06 DIAGNOSIS — C3490 Malignant neoplasm of unspecified part of unspecified bronchus or lung: Secondary | ICD-10-CM | POA: Diagnosis not present

## 2021-07-06 DIAGNOSIS — C3491 Malignant neoplasm of unspecified part of right bronchus or lung: Secondary | ICD-10-CM | POA: Diagnosis not present

## 2021-07-07 DIAGNOSIS — C3490 Malignant neoplasm of unspecified part of unspecified bronchus or lung: Secondary | ICD-10-CM | POA: Diagnosis not present

## 2021-08-16 ENCOUNTER — Other Ambulatory Visit: Payer: Self-pay

## 2021-08-16 DIAGNOSIS — F411 Generalized anxiety disorder: Secondary | ICD-10-CM

## 2021-08-16 DIAGNOSIS — F3341 Major depressive disorder, recurrent, in partial remission: Secondary | ICD-10-CM

## 2021-08-16 DIAGNOSIS — E7800 Pure hypercholesterolemia, unspecified: Secondary | ICD-10-CM

## 2021-08-16 DIAGNOSIS — I10 Essential (primary) hypertension: Secondary | ICD-10-CM

## 2021-08-16 DIAGNOSIS — Z0000 Encounter for general adult medical examination without abnormal findings: Secondary | ICD-10-CM

## 2021-08-16 DIAGNOSIS — R7309 Other abnormal glucose: Secondary | ICD-10-CM

## 2021-08-17 ENCOUNTER — Ambulatory Visit
Admission: RE | Admit: 2021-08-17 | Discharge: 2021-08-17 | Disposition: A | Discharge status: Home / Self Care | Payer: Medicare Other | Source: Home / Self Care | Attending: Internal Medicine | Admitting: Internal Medicine

## 2021-08-17 ENCOUNTER — Encounter: Payer: Self-pay | Admitting: Internal Medicine

## 2021-08-17 ENCOUNTER — Other Ambulatory Visit: Payer: Self-pay

## 2021-08-17 ENCOUNTER — Other Ambulatory Visit: Payer: Medicaid Other

## 2021-08-17 ENCOUNTER — Ambulatory Visit
Admission: RE | Admit: 2021-08-17 | Discharge: 2021-08-17 | Disposition: A | Discharge status: Home / Self Care | Payer: Medicare Other | Source: Ambulatory Visit | Attending: Internal Medicine | Admitting: Internal Medicine

## 2021-08-17 ENCOUNTER — Ambulatory Visit (INDEPENDENT_AMBULATORY_CARE_PROVIDER_SITE_OTHER): Payer: Medicaid Other | Admitting: Internal Medicine

## 2021-08-17 VITALS — BP 133/66 | Temp 97.6°F | Resp 17 | Ht 62.0 in | Wt 214.0 lb

## 2021-08-17 DIAGNOSIS — E7800 Pure hypercholesterolemia, unspecified: Secondary | ICD-10-CM | POA: Diagnosis not present

## 2021-08-17 DIAGNOSIS — M5442 Lumbago with sciatica, left side: Secondary | ICD-10-CM

## 2021-08-17 DIAGNOSIS — Z6839 Body mass index (BMI) 39.0-39.9, adult: Secondary | ICD-10-CM

## 2021-08-17 DIAGNOSIS — G8929 Other chronic pain: Secondary | ICD-10-CM

## 2021-08-17 DIAGNOSIS — M5432 Sciatica, left side: Secondary | ICD-10-CM

## 2021-08-17 DIAGNOSIS — M545 Low back pain, unspecified: Secondary | ICD-10-CM | POA: Diagnosis not present

## 2021-08-17 DIAGNOSIS — I10 Essential (primary) hypertension: Secondary | ICD-10-CM | POA: Diagnosis not present

## 2021-08-17 DIAGNOSIS — R7309 Other abnormal glucose: Secondary | ICD-10-CM | POA: Diagnosis not present

## 2021-08-17 MED ORDER — GABAPENTIN 100 MG PO CAPS: 100.0000 mg | ORAL_CAPSULE | Freq: Three times a day (TID) | ORAL | 0 refills | Status: DC

## 2021-08-17 MED ORDER — IBUPROFEN 600 MG PO TABS: 600.0000 mg | ORAL_TABLET | Freq: Two times a day (BID) | ORAL | 0 refills | Status: DC | PRN

## 2021-08-17 NOTE — Patient Instructions (Signed)
Sciatica Rehab °Ask your health care provider which exercises are safe for you. Do exercises exactly as told by your health care provider and adjust them as directed. It is normal to feel mild stretching, pulling, tightness, or discomfort as you do these exercises. Stop right away if you feel sudden pain or your pain gets worse. Do not begin these exercises until told by your health care provider. °Stretching and range-of-motion exercises °These exercises warm up your muscles and joints and improve the movement and flexibility of your hips and back. These exercises also help to relieve pain, numbness, and tingling. °Sciatic nerve glide °Sit in a chair with your head facing down toward your chest. Place your hands behind your back. Let your shoulders slump forward. °Slowly straighten one of your legs while you tilt your head back as if you are looking toward the ceiling. Only straighten your leg as far as you can without making your symptoms worse. °Hold this position for __________ seconds. °Slowly return to the starting position. °Repeat with your other leg. °Repeat __________ times. Complete this exercise __________ times a day. °Knee to chest with hip adduction and internal rotation ° °Lie on your back on a firm surface with both legs straight. °Bend one of your knees and move it up toward your chest until you feel a gentle stretch in your lower back and buttock. Then, move your knee toward the shoulder that is on the opposite side from your leg. This is hip adduction and internal rotation. °Hold your leg in this position by holding on to the front of your knee. °Hold this position for __________ seconds. °Slowly return to the starting position. °Repeat with your other leg. °Repeat __________ times. Complete this exercise __________ times a day. °Prone extension on elbows ° °Lie on your abdomen on a firm surface. A bed may be too soft for this exercise. °Prop yourself up on your elbows. °Use your arms to help  lift your chest up until you feel a gentle stretch in your abdomen and your lower back. °This will place some of your body weight on your elbows. If this is uncomfortable, try stacking pillows under your chest. °Your hips should stay down, against the surface that you are lying on. Keep your hip and back muscles relaxed. °Hold this position for __________ seconds. °Slowly relax your upper body and return to the starting position. °Repeat __________ times. Complete this exercise __________ times a day. °Strengthening exercises °These exercises build strength and endurance in your back. Endurance is the ability to use your muscles for a long time, even after they get tired. °Pelvic tilt °This exercise strengthens the muscles that lie deep in the abdomen. °Lie on your back on a firm surface. Bend your knees and keep your feet flat on the floor. °Tense your abdominal muscles. Tip your pelvis up toward the ceiling and flatten your lower back into the floor. °To help with this exercise, you may place a small towel under your lower back and try to push your back into the towel. °Hold this position for __________ seconds. °Let your muscles relax completely before you repeat this exercise. °Repeat __________ times. Complete this exercise __________ times a day. °Alternating arm and leg raises ° °Get on your hands and knees on a firm surface. If you are on a hard floor, you may want to use padding, such as an exercise mat, to cushion your knees. °Line up your arms and legs. Your hands should be directly below your shoulders,   and your knees should be directly below your hips. °Lift your left leg behind you. At the same time, raise your right arm and straighten it in front of you. °Do not lift your leg higher than your hip. °Do not lift your arm higher than your shoulder. °Keep your abdominal and back muscles tight. °Keep your hips facing the ground. °Do not arch your back. °Keep your balance carefully, and do not hold your  breath. °Hold this position for __________ seconds. °Slowly return to the starting position. °Repeat with your right leg and your left arm. °Repeat __________ times. Complete this exercise __________ times a day. °Posture and body mechanics °Good posture and healthy body mechanics can help to relieve stress in your body's tissues and joints. Body mechanics refers to the movements and positions of your body while you do your daily activities. Posture is part of body mechanics. Good posture means: °Your spine is in its natural S-curve position (neutral). °Your shoulders are pulled back slightly. °Your head is not tipped forward. °Follow these guidelines to improve your posture and body mechanics in your everyday activities. °Standing ° °When standing, keep your spine neutral and your feet about hip width apart. Keep a slight bend in your knees. Your ears, shoulders, and hips should line up. °When you do a task in which you stand in one place for a long time, place one foot up on a stable object that is 2-4 inches (5-10 cm) high, such as a footstool. This helps keep your spine neutral. °Sitting ° °When sitting, keep your spine neutral and keep your feet flat on the floor. Use a footrest, if necessary, and keep your thighs parallel to the floor. Avoid rounding your shoulders, and avoid tilting your head forward. °When working at a desk or a computer, keep your desk at a height where your hands are slightly lower than your elbows. Slide your chair under your desk so you are close enough to maintain good posture. °When working at a computer, place your monitor at a height where you are looking straight ahead and you do not have to tilt your head forward or downward to look at the screen. °Resting °When lying down and resting, avoid positions that are most painful for you. °If you have pain with activities such as sitting, bending, stooping, or squatting, lie in a position in which your body does not bend very much. For  example, avoid curling up on your side with your arms and knees near your chest (fetal position). °If you have pain with activities such as standing for a long time or reaching with your arms, lie with your spine in a neutral position and bend your knees slightly. Try the following positions: °Lying on your side with a pillow between your knees. °Lying on your back with a pillow under your knees. °Lifting ° °When lifting objects, keep your feet at least shoulder width apart and tighten your abdominal muscles. °Bend your knees and hips and keep your spine neutral. It is important to lift using the strength of your legs, not your back. Do not lock your knees straight out. °Always ask for help to lift heavy or awkward objects. °This information is not intended to replace advice given to you by your health care provider. Make sure you discuss any questions you have with your health care provider. °Document Revised: 03/15/2019 Document Reviewed: 12/13/2018 °Elsevier Patient Education © 2022 [employer] Inc[employer] ° °

## 2021-08-17 NOTE — Progress Notes (Signed)
Subjective:    Patient ID: Cheryl Payne, female    DOB: 09-Dec-1965, 55 y.o.   MRN: [id number]  HPI  Pt presents to the clinic today with complaint of left-sided low back pain.  She reports this started 2 months ago.  She describes the pain as sharp and shooting. The pain radiates into her buttocks and down her left leg.  She reports associated tingling in her left foot.  The pain is worse if she stands for long periods of time.  She denies any issues with bowel or bladder control.  She denies any injury to the back or prior back surgeries.  She was seen in the ER for the same 06/2021.  They ultrasounded her leg at that time to rule out DVT but did not obtain any imaging of her back.  She was prescribed Meloxicam and Methocarbamol which she took but does not recall any relief with this.  She followed up with her PCP few days later who prescribed Gabapentin.  She reports she took this medication and feels like it did help but did not know why she was taking it.  She reports the prescription ran out and she has not been taking it since that time.  She has been taking Ibuprofen, Tylenol and applying ice OTC with minimal relief of symptoms.  Review of Systems  Past Medical History:  Diagnosis Date   Cancer ([place])    lung   Chronic left shoulder pain     Current Outpatient Medications  Medication Sig Dispense Refill   albuterol (VENTOLIN HFA) 108 (90 Base) MCG/ACT inhaler Inhale 2 puffs into the lungs every 4 (four) hours as needed for wheezing or shortness of breath. 6.7 g 2   amLODipine (NORVASC) 5 MG tablet Take 1 tablet (5 mg total) by mouth daily. 90 tablet 3   aspirin EC 81 MG tablet Take 81 mg by mouth daily. Swallow whole.     escitalopram (LEXAPRO) 10 MG tablet Take 1 tablet (10 mg total) by mouth daily. Start once finished Citalopram 10mg  dosage taper off. 30 tablet 5   metoprolol tartrate (LOPRESSOR) 25 MG tablet Take 1 tablet (25 mg total) by mouth 2 (two) times daily. 180 tablet 3    rosuvastatin (CRESTOR) 10 MG tablet Take 1 tablet (10 mg total) by mouth daily. 90 tablet 3   gabapentin (NEURONTIN) 100 MG capsule START WITH 1 CAPSULE BY MOUTH DAILY. INC BY 1 CAPSULE EVERY 2 TO 3 DAYS AS TOLERATED UP TO THREE TIMES DAILY. OR MAY TAKE 3 EVERY EVENING (Patient not taking: No sig reported) 270 capsule 0   No current facility-administered medications for this visit.    No Known Allergies  Family History  Problem Relation Age of Onset   Stomach cancer Mother 81   Breast cancer Mother    Lung cancer Father 76    Social History   Socioeconomic History   Marital status: Single    Spouse name: Not on file   Number of children: Not on file   Years of education: Not on file   Highest education level: Not on file  Occupational History   Not on file  Tobacco Use   Smoking status: Former    Packs/day: 1.00    Years: 35.00    Pack years: 35.00    Types: Cigarettes   Smokeless tobacco: Former    Quit date: 06/04/2018  Substance and Sexual Activity   Alcohol use: Yes   Drug use: Never   Sexual  activity: Not on file  Other Topics Concern   Not on file  Social History Narrative   Not on file   Social Determinants of Health   Financial Resource Strain: Not on file  Food Insecurity: Not on file  Transportation Needs: Not on file  Physical Activity: Not on file  Stress: Not on file  Social Connections: Not on file  Intimate Partner Violence: Not on file     Constitutional: Denies fever, malaise, fatigue, headache or abrupt weight changes.  Respiratory: Denies difficulty breathing, shortness of breath, cough or sputum production.   Cardiovascular: Denies chest pain, chest tightness, palpitations or swelling in the hands or feet.  Gastrointestinal: Denies abdominal pain, bloating, constipation, diarrhea, blood in the stool or loss of bowel control.  GU: Denies urgency, frequency, pain with urination, burning sensation, blood in urine, odor, discharge or loss of  bladder control. Musculoskeletal: Patient reports low back pain, radiating into her left leg.  Denies decrease in range of motion, difficulty with gait, or joint swelling.  Skin: Denies redness, rashes, lesions or ulcercations.  Neurological: Patient reports tingling in her left foot.  Denies dizziness, difficulty with memory, difficulty with speech or problems with balance and coordination.    No other specific complaints in a complete review of systems (except as listed in HPI above).     Objective:   Physical Exam  BP 133/66 (BP Location: Right Arm, Patient Position: Sitting, Cuff Size: Large)   Temp 97.6 F (36.4 C) (Temporal)   Resp 17   Ht 5\' 2"  (1.575 m)   Wt 214 lb (97.1 kg)   SpO2 98%   BMI 39.14 kg/m   Wt Readings from Last 3 Encounters:  06/30/21 214 lb (97.1 kg)  06/28/21 214 lb 12.8 oz (97.4 kg)  06/14/21 210 lb (95.3 kg)    General: Appears her stated age, obese, in NAD. Skin: Warm, dry and intact. No rashes noted. Cardiovascular: Normal rate and rhythm. Pedal pulse 2+ bilaterally. Pulmonary/Chest: Normal effort and positive vesicular breath sounds.  Musculoskeletal: Normal flexion, extension, rotation and lateral bending of the spine.  No bony tenderness noted over the lumbar spine.  Pain with palpation of the left SI joint.  Strength 5/5 BLE.  She is able to stand on tiptoes and heels.  No difficulty with gait.  Neurological: Alert and oriented.  Sensation intact to BLE.   BMET    Component Value Date/Time   NA 141 08/13/2020 0848   K 4.3 08/13/2020 0848   CL 103 08/13/2020 0848   CO2 27 08/13/2020 0848   GLUCOSE 89 08/13/2020 0848   BUN 9 08/13/2020 0848   CREATININE 0.78 08/13/2020 0848   CALCIUM 9.4 08/13/2020 0848   GFRNONAA 86 08/13/2020 0848   GFRAA 99 08/13/2020 0848    Lipid Panel     Component Value Date/Time   CHOL 253 (H) 08/13/2020 0848   TRIG 205 (H) 08/13/2020 0848   HDL 61 08/13/2020 0848   CHOLHDL 4.1 08/13/2020 0848   LDLCALC  155 (H) 08/13/2020 0848    CBC    Component Value Date/Time   WBC 5.0 08/13/2020 0848   RBC 4.33 08/13/2020 0848   HGB 13.8 08/13/2020 0848   HCT 41.5 08/13/2020 0848   PLT 240 08/13/2020 0848   MCV 95.8 08/13/2020 0848   MCH 31.9 08/13/2020 0848   MCHC 33.3 08/13/2020 0848   RDW 13.7 08/13/2020 0848   LYMPHSABS 2,540 08/13/2020 0848   EOSABS 50 08/13/2020 0848  BASOSABS 50 08/13/2020 0848    Hgb A1C Lab Results  Component Value Date   HGBA1C 5.6 08/13/2020            Assessment & Plan:   Chronic Left-Sided Low Back Pain with Left-Sided Sciatica:  ER and PCP notes reviewed X-ray lumbar spine today Rx for Ibuprofen 600 mg twice daily as needed-consume with food Gabapentin refilled-sedation caution given Encourage stretching and ice Discussed possible further work-up with physical therapy versus MRI  We will follow-up after imaging is back, return precautions discussed Webb Silversmith, NP This visit occurred during the SARS-CoV-2 public health emergency.  Safety protocols were in place, including screening questions prior to the visit, additional usage of staff PPE, and extensive cleaning of exam room while observing appropriate contact time as indicated for disinfecting solutions.

## 2021-08-18 LAB — CBC WITH DIFFERENTIAL/PLATELET
Absolute Monocytes: 499 cells/uL (ref 200–950)
Basophils Absolute: 52 cells/uL (ref 0–200)
Basophils Relative: 0.9 %
Eosinophils Absolute: 58 cells/uL (ref 15–500)
Eosinophils Relative: 1 %
HCT: 39.1 % (ref 35.0–45.0)
Hemoglobin: 13 g/dL (ref 11.7–15.5)
Lymphs Abs: 3318 cells/uL (ref 850–3900)
MCH: 31 pg (ref 27.0–33.0)
MCHC: 33.2 g/dL (ref 32.0–36.0)
MCV: 93.1 fL (ref 80.0–100.0)
MPV: 10.6 fL (ref 7.5–12.5)
Monocytes Relative: 8.6 %
Neutro Abs: 1873 cells/uL (ref 1500–7800)
Neutrophils Relative %: 32.3 %
Platelets: 272 10*3/uL (ref 140–400)
RBC: 4.2 10*6/uL (ref 3.80–5.10)
RDW: 14.7 % (ref 11.0–15.0)
Total Lymphocyte: 57.2 %
WBC: 5.8 10*3/uL (ref 3.8–10.8)

## 2021-08-18 LAB — COMPLETE METABOLIC PANEL WITH GFR
AG Ratio: 1.6 (calc) (ref 1.0–2.5)
ALT: 16 U/L (ref 6–29)
AST: 19 U/L (ref 10–35)
Albumin: 4.1 g/dL (ref 3.6–5.1)
Alkaline phosphatase (APISO): 89 U/L (ref 37–153)
BUN: 14 mg/dL (ref 7–25)
CO2: 29 mmol/L (ref 20–32)
Calcium: 9.5 mg/dL (ref 8.6–10.4)
Chloride: 104 mmol/L (ref 98–110)
Creat: 0.78 mg/dL (ref 0.50–1.03)
Globulin: 2.6 g/dL (calc) (ref 1.9–3.7)
Glucose, Bld: 84 mg/dL (ref 65–139)
Potassium: 4.9 mmol/L (ref 3.5–5.3)
Sodium: 140 mmol/L (ref 135–146)
Total Bilirubin: 0.3 mg/dL (ref 0.2–1.2)
Total Protein: 6.7 g/dL (ref 6.1–8.1)
eGFR: 89 mL/min/{1.73_m2} (ref >=60)

## 2021-08-18 LAB — TSH: TSH: 1.14 mIU/L (ref 0.40–4.50)

## 2021-08-18 LAB — LIPID PANEL
Cholesterol: 203 mg/dL — ABNORMAL HIGH (ref <200)
HDL: 56 mg/dL (ref >=50)
LDL Cholesterol (Calc): 120 mg/dL (calc) — ABNORMAL HIGH
Non-HDL Cholesterol (Calc): 147 mg/dL (calc) — ABNORMAL HIGH (ref <130)
Total CHOL/HDL Ratio: 3.6 (calc) (ref <5.0)
Triglycerides: 157 mg/dL — ABNORMAL HIGH (ref <150)

## 2021-08-18 LAB — HEMOGLOBIN A1C
Hgb A1c MFr Bld: 5.6 % of total Hgb (ref <5.7)
Mean Plasma Glucose: 114 mg/dL
eAG (mmol/L): 6.3 mmol/L

## 2021-08-24 ENCOUNTER — Ambulatory Visit (INDEPENDENT_AMBULATORY_CARE_PROVIDER_SITE_OTHER): Payer: Medicaid Other | Admitting: Family Medicine

## 2021-08-24 ENCOUNTER — Encounter: Payer: Self-pay | Admitting: Family Medicine

## 2021-08-24 ENCOUNTER — Other Ambulatory Visit: Payer: Self-pay

## 2021-08-24 ENCOUNTER — Other Ambulatory Visit: Payer: Self-pay | Admitting: Family Medicine

## 2021-08-24 VITALS — BP 132/67 | HR 73 | Ht 62.0 in | Wt 213.6 lb

## 2021-08-24 DIAGNOSIS — J432 Centrilobular emphysema: Secondary | ICD-10-CM | POA: Diagnosis not present

## 2021-08-24 DIAGNOSIS — C3491 Malignant neoplasm of unspecified part of right bronchus or lung: Secondary | ICD-10-CM | POA: Diagnosis not present

## 2021-08-24 DIAGNOSIS — G8929 Other chronic pain: Secondary | ICD-10-CM

## 2021-08-24 DIAGNOSIS — Z23 Encounter for immunization: Secondary | ICD-10-CM | POA: Diagnosis not present

## 2021-08-24 DIAGNOSIS — Z0000 Encounter for general adult medical examination without abnormal findings: Secondary | ICD-10-CM

## 2021-08-24 MED ORDER — PREDNISONE 20 MG PO TABS: ORAL_TABLET | ORAL | 0 refills | Status: DC

## 2021-08-24 MED ORDER — METHOCARBAMOL 750 MG PO TABS: 750.0000 mg | ORAL_TABLET | Freq: Four times a day (QID) | ORAL | 2 refills | Status: DC | PRN

## 2021-08-24 MED ORDER — MELOXICAM 15 MG PO TABS: 15.0000 mg | ORAL_TABLET | Freq: Every day | ORAL | 2 refills | Status: DC | PRN

## 2021-08-24 NOTE — Patient Instructions (Addendum)
Thank you for coming to the office today.  For the back / sciatica.  Start Prednisone steroid 7 day taper. DO NOT take Meloxicam while on this med.  AFTER Prednisone, can start Meloxicam anti inflammatory - 15mg  once daily with meal, after 1-2 weeks, can go to ONLY AS NEEDED, prefer to take for a few days or a week then STOP for 1-2 weeks then restart. Make sure it is INTERMITTENT ONLY.  Start Methocarbamol Robaxin muscle relaxant 1 pill every 6-8 hours as needed for pain and muscle spasm, can be taken with the above medications without problem  STOP Ibuprofen Gabapentin.  COVID Shot Bivalent updated booster when ready at pharmacy  [employer] today.  Labs look great!  Please schedule a Follow-up Appointment to: Return in about 6 months (around 02/21/2022) for 6 month follow-up.  If you have any other questions or concerns, please feel free to call the office or send a message through [place]. You may also schedule an earlier appointment if necessary.  Additionally, you may be receiving a survey about your experience at our office within a few days to 1 week by e-mail or mail. We value your feedback.  Cheryl Putnam, DO [facility], [facility]   Sciatica Rehab Ask your health care provider which exercises are safe for you. Do exercises exactly as told by your health care provider and adjust them as directed. It is normal to feel mild stretching, pulling, tightness, or discomfort as you do these exercises. Stop right away if you feel sudden pain or your pain gets worse. Do not begin these exercises until told by your health care provider. Stretching and range-of-motion exercises These exercises warm up your muscles and joints and improve the movement and flexibility of your hips and back. These exercises also help to relieve pain, numbness, and tingling. Sciatic nerve glide Sit in a chair with your head facing down toward your chest. Place your hands behind your back. Let  your shoulders slump forward. Slowly straighten one of your legs while you tilt your head back as if you are looking toward the ceiling. Only straighten your leg as far as you can without making your symptoms worse. Hold this position for __________ seconds. Slowly return to the starting position. Repeat with your other leg. Repeat __________ times. Complete this exercise __________ times a day. Knee to chest with hip adduction and internal rotation  Lie on your back on a firm surface with both legs straight. Bend one of your knees and move it up toward your chest until you feel a gentle stretch in your lower back and buttock. Then, move your knee toward the shoulder that is on the opposite side from your leg. This is hip adduction and internal rotation. Hold your leg in this position by holding on to the front of your knee. Hold this position for __________ seconds. Slowly return to the starting position. Repeat with your other leg. Repeat __________ times. Complete this exercise __________ times a day. Prone extension on elbows  Lie on your abdomen on a firm surface. A bed may be too soft for this exercise. Prop yourself up on your elbows. Use your arms to help lift your chest up until you feel a gentle stretch in your abdomen and your lower back. This will place some of your body weight on your elbows. If this is uncomfortable, try stacking pillows under your chest. Your hips should stay down, against the surface that you are lying on. Keep your  hip and back muscles relaxed. Hold this position for __________ seconds. Slowly relax your upper body and return to the starting position. Repeat __________ times. Complete this exercise __________ times a day. Strengthening exercises These exercises build strength and endurance in your back. Endurance is the ability to use your muscles for a long time, even after they get tired. Pelvic tilt This exercise strengthens the muscles that lie deep  in the abdomen. Lie on your back on a firm surface. Bend your knees and keep your feet flat on the floor. Tense your abdominal muscles. Tip your pelvis up toward the ceiling and flatten your lower back into the floor. To help with this exercise, you may place a small towel under your lower back and try to push your back into the towel. Hold this position for __________ seconds. Let your muscles relax completely before you repeat this exercise. Repeat __________ times. Complete this exercise __________ times a day. Alternating arm and leg raises  Get on your hands and knees on a firm surface. If you are on a hard floor, you may want to use padding, such as an exercise mat, to cushion your knees. Line up your arms and legs. Your hands should be directly below your shoulders, and your knees should be directly below your hips. Lift your left leg behind you. At the same time, raise your right arm and straighten it in front of you. Do not lift your leg higher than your hip. Do not lift your arm higher than your shoulder. Keep your abdominal and back muscles tight. Keep your hips facing the ground. Do not arch your back. Keep your balance carefully, and do not hold your breath. Hold this position for __________ seconds. Slowly return to the starting position. Repeat with your right leg and your left arm. Repeat __________ times. Complete this exercise __________ times a day. Posture and body mechanics Good posture and healthy body mechanics can help to relieve stress in your body's tissues and joints. Body mechanics refers to the movements and positions of your body while you do your daily activities. Posture is part of body mechanics. Good posture means: Your spine is in its natural S-curve position (neutral). Your shoulders are pulled back slightly. Your head is not tipped forward. Follow these guidelines to improve your posture and body mechanics in your everyday activities. Standing  When  standing, keep your spine neutral and your feet about hip width apart. Keep a slight bend in your knees. Your ears, shoulders, and hips should line up. When you do a task in which you stand in one place for a long time, place one foot up on a stable object that is 2-4 inches (5-10 cm) high, such as a footstool. This helps keep your spine neutral. Sitting  When sitting, keep your spine neutral and keep your feet flat on the floor. Use a footrest, if necessary, and keep your thighs parallel to the floor. Avoid rounding your shoulders, and avoid tilting your head forward. When working at a desk or a computer, keep your desk at a height where your hands are slightly lower than your elbows. Slide your chair under your desk so you are close enough to maintain good posture. When working at a computer, place your monitor at a height where you are looking straight ahead and you do not have to tilt your head forward or downward to look at the screen. Resting When lying down and resting, avoid positions that are most painful for you.  If you have pain with activities such as sitting, bending, stooping, or squatting, lie in a position in which your body does not bend very much. For example, avoid curling up on your side with your arms and knees near your chest (fetal position). If you have pain with activities such as standing for a long time or reaching with your arms, lie with your spine in a neutral position and bend your knees slightly. Try the following positions: Lying on your side with a pillow between your knees. Lying on your back with a pillow under your knees. Lifting  When lifting objects, keep your feet at least shoulder width apart and tighten your abdominal muscles. Bend your knees and hips and keep your spine neutral. It is important to lift using the strength of your legs, not your back. Do not lock your knees straight out. Always ask for help to lift heavy or awkward objects. This information  is not intended to replace advice given to you by your health care provider. Make sure you discuss any questions you have with your health care provider. Document Revised: 03/15/2019 Document Reviewed: 12/13/2018 Elsevier Patient Education  [place].

## 2021-08-24 NOTE — Progress Notes (Signed)
Subjective:    Patient ID: Cheryl Payne, female    DOB: [birth year], 55 y.o.   MRN: [id number]  Cheryl Payne is a 55 y.o. female presenting on 08/24/2021 for Annual Exam   HPI  Here for Annual Physical and Lab Review.  Chronic left low back pain sciatica History flare since 06/2021, treated in ED, toradol inj and medication, improved then flared up again in 08/2021, seen on 9/13 1 week ago by Webb Silversmith, FNP given Gabapentin, and Ibuprofen limited pain relief, still has pain now, worse with prolonged fixed position and movements.  She has multidisciplinary team currently in [facility] in [place] that she will plan to keep for surveillance of her lung cancer history. - Dr Roseanne Reno (Cardiothoracic Surgery) - Ancil Boozer FNP (Oncology)   Non Small Cell Lung Cancer, RIGHT upper S/p chemotherapy/radiation S/p Status post right upper lobectomy, 11/15/18  On CT Surveillance.   Hypertension HYPERLIPIDEMIA Coronary Atherosclerosis with calcification / CAD History of Moderate Aortic Valve Insufficiency  Followed by Cardiologist through [facility] after her surgery. Last result lipid 08/2021 - Currently taking FULL DOSE now Rosuvastatin 44m, tolerating well without side effects or myalgias - Taking Amlodipine 568mdaily (has not taken today, until after blood work) - Taking Metoprolol tartrate 2571mID for cardioprotection   Centrilobular Emphysema COPD History of Tobacco Abuse, former smoker She has done spirometry was told mild obstructive lung disease or mild COPD Not using any albuterol currently has used in past. Not on maintenance therapy either. Doing well.   Generalized Anxiety Disorder Major Depression, chronic recurrent mild - partial remission Chronic history 10+ years in past, has had primarily issues with generalized anxiety disorder    Health Maintenance:   Completed Pap smear per GYN, results below. NILM and Negative HPV.   Breast CA Screening:  Due for mammogram screening. Last mammogram result bi rads 1 negative on TOMO 3D [facility] abnormal mammogram. No known family history of breast cancer. Currently asymptomatic. - Ordered last visit, she will call to schedule [facility].   Colon CA Screening: Never had colonoscopy. Currently asymptomatic. No known family history of colon CA. Due for screening test - Cologuard was ordered initial visit - she has completed this test, last week Thursday, now 1 week later waiting on results.   Additional concern:   Health Maintenance:  Due for Flu Shot, will receive today   Due for COVID Booster Omicron Variant  [facility] 08/24/2021 08/17/2021 06/28/2021  Decreased Interest 1 0 0  Down, Depressed, Hopeless 0 0 1  PHQ - 2 Score 1 0 1  Altered sleeping _0 Tired, decreased energy _1 Change in appetite _2 Feeling bad or failure about yourself  0 0 1  Trouble concentrating 0 0 0  Moving slowly or fidgety/restless 0 0 0  Suicidal thoughts 0 0 0  PHQ-9 Score _3 Difficult doing work/chores Not difficult at all Not difficult at all Somewhat difficult    Past Medical History:  Diagnosis Date   Cancer ([facility]  lung   Chronic left shoulder pain    Past Surgical History:  Procedure Laterality Date   LUNG SURGERY     Social History   Socioeconomic History   Marital status: Single    Spouse name: Not on file   Number of children: Not on file   Years of education: Not on file  Highest education level: Not on file  Occupational History   Not on file  Tobacco Use   Smoking status: Former    Packs/day: 1.00    Years: 35.00    Pack years: 35.00    Types: Cigarettes   Smokeless tobacco: Former    Quit date: 06/04/2018  Substance and Sexual Activity   Alcohol use: Never   Drug use: Never   Sexual activity: Not on file  Other Topics Concern   Not on file  Social History Narrative   Not on file   Social Determinants  of Health   Financial Resource Strain: Not on file  Food Insecurity: Not on file  Transportation Needs: Not on file  Physical Activity: Not on file  Stress: Not on file  Social Connections: Not on file  Intimate Partner Violence: Not on file   Family History  Problem Relation Age of Onset   Stomach cancer Mother 31   Breast cancer Mother    Lung cancer Father 65   Current Outpatient Medications on File Prior to Visit  Medication Sig   albuterol (VENTOLIN HFA) 108 (90 Base) MCG/ACT inhaler Inhale 2 puffs into the lungs every 4 (four) hours as needed for wheezing or shortness of breath.   amLODipine (NORVASC) 5 MG tablet Take 1 tablet (5 mg total) by mouth daily.   aspirin EC 81 MG tablet Take 81 mg by mouth daily. Swallow whole.   escitalopram (LEXAPRO) 10 MG tablet Take 1 tablet (10 mg total) by mouth daily. Start once finished Citalopram 10mg  dosage taper off.   metoprolol tartrate (LOPRESSOR) 25 MG tablet Take 1 tablet (25 mg total) by mouth 2 (two) times daily.   rosuvastatin (CRESTOR) 10 MG tablet Take 1 tablet (10 mg total) by mouth daily.   No current facility-administered medications on file prior to visit.    Review of Systems  Constitutional:  Negative for activity change, appetite change, chills, diaphoresis, fatigue and fever.  HENT:  Negative for congestion and hearing loss.   Eyes:  Negative for visual disturbance.  Respiratory:  Negative for cough, chest tightness, shortness of breath and wheezing.   Cardiovascular:  Negative for chest pain, palpitations and leg swelling.  Gastrointestinal:  Negative for abdominal pain, constipation, diarrhea, nausea and vomiting.  Genitourinary:  Negative for dysuria, frequency and hematuria.  Musculoskeletal:  Negative for arthralgias and neck pain.  Skin:  Negative for rash.  Neurological:  Negative for dizziness, weakness, light-headedness, numbness and headaches.  Hematological:  Negative for adenopathy.   Psychiatric/Behavioral:  Negative for behavioral problems, dysphoric mood and sleep disturbance.   Per HPI unless specifically indicated above      Objective:    BP 132/67   Pulse 73   Ht 5\' 2"  (1.575 m)   Wt 213 lb 9.6 oz (96.9 kg)   SpO2 99%   BMI 39.07 kg/m   Wt Readings from Last 3 Encounters:  08/24/21 213 lb 9.6 oz (96.9 kg)  08/17/21 214 lb (97.1 kg)  06/30/21 214 lb (97.1 kg)    Physical Exam Vitals and nursing note reviewed.  Constitutional:      General: She is not in acute distress.    Appearance: She is well-developed. She is not diaphoretic.     Comments: Well-appearing, comfortable, cooperative  HENT:     Head: Normocephalic and atraumatic.  Eyes:     General:        Right eye: No discharge.        Left eye:  No discharge.     Conjunctiva/sclera: Conjunctivae normal.     Pupils: Pupils are equal, round, and reactive to light.  Neck:     Thyroid: No thyromegaly.  Cardiovascular:     Rate and Rhythm: Normal rate and regular rhythm.     Pulses: Normal pulses.     Heart sounds: Normal heart sounds. No murmur heard. Pulmonary:     Effort: Pulmonary effort is normal. No respiratory distress.     Breath sounds: Normal breath sounds. No wheezing or rales.  Abdominal:     General: Bowel sounds are normal. There is no distension.     Palpations: Abdomen is soft. There is no mass.     Tenderness: There is no abdominal tenderness.  Musculoskeletal:        General: No tenderness. Normal range of motion.     Cervical back: Normal range of motion and neck supple.     Comments: Upper / Lower Extremities: - Normal muscle tone, strength bilateral upper extremities 5/5, lower extremities 5/5  Lymphadenopathy:     Cervical: No cervical adenopathy.  Skin:    General: Skin is warm and dry.     Findings: No erythema or rash.  Neurological:     Mental Status: She is alert and oriented to person, place, and time.     Comments: Distal sensation intact to light touch all  extremities  Psychiatric:        Mood and Affect: Mood normal.        Behavior: Behavior normal.        Thought Content: Thought content normal.     Comments: Well groomed, good eye contact, normal speech and thoughts    DG Lumbar Spine CompletePerformed 08/17/2021 Final result  Study Result CLINICAL DATA: Left lower back and lower extremity pain.  EXAM: LUMBAR SPINE - COMPLETE 4+ VIEW  COMPARISON: None.  FINDINGS: There is no evidence of lumbar spine fracture. Alignment is normal. Intervertebral disc spaces are maintained. Mild anterior osteophyte formation is noted at L3-4.  IMPRESSION: Mild degenerative changes are noted at L3-4. No acute abnormalities noted.   Electronically Signed By: Marijo Conception M.D. On: 08/18/2021 09:17    Results for orders placed or performed in visit on 08/16/21  TSH  Result Value Ref Range   TSH 1.14 0.40 - 4.50 mIU/L  Lipid panel  Result Value Ref Range   Cholesterol 203 (H) <200 mg/dL   HDL 56 > OR = 50 mg/dL   Triglycerides 157 (H) <150 mg/dL   LDL Cholesterol (Calc) 120 (H) mg/dL (calc)   Total CHOL/HDL Ratio 3.6 <5.0 (calc)   Non-HDL Cholesterol (Calc) 147 (H) <130 mg/dL (calc)  COMPLETE METABOLIC PANEL WITH GFR  Result Value Ref Range   Glucose, Bld 84 65 - 139 mg/dL   BUN 14 7 - 25 mg/dL   Creat 0.78 0.50 - 1.03 mg/dL   eGFR 89 > OR = 60 mL/min/1.28m   BUN/Creatinine Ratio NOT APPLICABLE 6 - 22 (calc)   Sodium 140 135 - 146 mmol/L   Potassium 4.9 3.5 - 5.3 mmol/L   Chloride 104 98 - 110 mmol/L   CO2 29 20 - 32 mmol/L   Calcium 9.5 8.6 - 10.4 mg/dL   Total Protein 6.7 6.1 - 8.1 g/dL   Albumin 4.1 3.6 - 5.1 g/dL   Globulin 2.6 1.9 - 3.7 g/dL (calc)   AG Ratio 1.6 1.0 - 2.5 (calc)   Total Bilirubin 0.3 0.2 - 1.2 mg/dL  Alkaline phosphatase (APISO) 89 37 - 153 U/L   AST 19 10 - 35 U/L   ALT 16 6 - 29 U/L  CBC with Differential/Platelet  Result Value Ref Range   WBC 5.8 3.8 - 10.8 Thousand/uL   RBC 4.20 3.80 - 5.10  Million/uL   Hemoglobin 13.0 11.7 - 15.5 g/dL   HCT 39.1 35.0 - 45.0 %   MCV 93.1 80.0 - 100.0 fL   MCH 31.0 27.0 - 33.0 pg   MCHC 33.2 32.0 - 36.0 g/dL   RDW 14.7 11.0 - 15.0 %   Platelets 272 140 - 400 Thousand/uL   MPV 10.6 7.5 - 12.5 fL   Neutro Abs 1,873 1,500 - 7,800 cells/uL   Lymphs Abs 3,318 850 - 3,900 cells/uL   Absolute Monocytes 499 200 - 950 cells/uL   Eosinophils Absolute 58 15 - 500 cells/uL   Basophils Absolute 52 0 - 200 cells/uL   Neutrophils Relative % 32.3 %   Total Lymphocyte 57.2 %   Monocytes Relative 8.6 %   Eosinophils Relative 1.0 %   Basophils Relative 0.9 %  Hemoglobin A1c  Result Value Ref Range   Hgb A1c MFr Bld 5.6 <5.7 % of total Hgb   Mean Plasma Glucose 114 mg/dL   eAG (mmol/L) 6.3 mmol/L      Assessment & Plan:   Problem List Items Addressed This Visit     Non-small cell carcinoma of lung (HCC)    Followed by multidisciplinary team ([facility] - with Oncology, Cardiothoracic Surgery) Currently without evidence of recurrence, now for 2 years S/p chemotherapy, radiation, surgical resection RUL  Plan Proceed w/ CT screening as planned      Centrilobular emphysema (HCC)    Stable without flare Known mild obstructive COPD identified on CT imaging / Spirometry S/p lung cancer Currently not on maintenance      Other Visit Diagnoses     Annual physical exam    -  Primary   Needs flu shot       Relevant Orders   Flu Vaccine QUAD 32moIM (Fluarix, Fluzone & Alfiuria Quad PF) (Completed)       Updated Health Maintenance information Reviewed recent lab results with patient Encouraged improvement to lifestyle with diet and exercise Goal of weight loss  Back pain flare up sciatica Recently addressed, agreed to send in other medication, follow-up if not improved   No orders of the defined types were placed in this encounter.     Follow up plan: Return in about 6 months (around 02/21/2022) for 6 month follow-up.  ANobie Putnam DO [facility] 08/24/2021, 8:11 AM

## 2021-08-24 NOTE — Assessment & Plan Note (Signed)
Stable without flare Known mild obstructive COPD identified on CT imaging / Spirometry S/p lung cancer Currently not on maintenance

## 2021-08-24 NOTE — Assessment & Plan Note (Signed)
Followed by multidisciplinary team ([facility] - with Oncology, Cardiothoracic Surgery) Currently without evidence of recurrence, now for 2 years S/p chemotherapy, radiation, surgical resection RUL  Plan Proceed w/ CT screening as planned

## 2021-08-25 DIAGNOSIS — M5431 Sciatica, right side: Secondary | ICD-10-CM | POA: Diagnosis not present

## 2021-08-27 DIAGNOSIS — M5431 Sciatica, right side: Secondary | ICD-10-CM | POA: Diagnosis not present

## 2021-08-30 DIAGNOSIS — M5431 Sciatica, right side: Secondary | ICD-10-CM | POA: Diagnosis not present

## 2021-09-02 ENCOUNTER — Other Ambulatory Visit: Payer: Self-pay | Admitting: Family Medicine

## 2021-09-02 DIAGNOSIS — F3341 Major depressive disorder, recurrent, in partial remission: Secondary | ICD-10-CM

## 2021-09-02 DIAGNOSIS — M5431 Sciatica, right side: Secondary | ICD-10-CM | POA: Diagnosis not present

## 2021-09-02 DIAGNOSIS — F411 Generalized anxiety disorder: Secondary | ICD-10-CM

## 2021-09-08 DIAGNOSIS — M5431 Sciatica, right side: Secondary | ICD-10-CM | POA: Diagnosis not present

## 2021-09-10 DIAGNOSIS — M5431 Sciatica, right side: Secondary | ICD-10-CM | POA: Diagnosis not present

## 2021-09-15 DIAGNOSIS — M5431 Sciatica, right side: Secondary | ICD-10-CM | POA: Diagnosis not present

## 2021-09-17 DIAGNOSIS — M5431 Sciatica, right side: Secondary | ICD-10-CM | POA: Diagnosis not present

## 2021-09-20 DIAGNOSIS — M5431 Sciatica, right side: Secondary | ICD-10-CM | POA: Diagnosis not present

## 2021-09-21 ENCOUNTER — Ambulatory Visit (INDEPENDENT_AMBULATORY_CARE_PROVIDER_SITE_OTHER): Payer: Medicaid Other | Admitting: Family Medicine

## 2021-09-21 ENCOUNTER — Telehealth: Payer: Self-pay | Admitting: Family Medicine

## 2021-09-21 ENCOUNTER — Other Ambulatory Visit: Payer: Self-pay

## 2021-09-21 ENCOUNTER — Encounter: Payer: Self-pay | Admitting: Family Medicine

## 2021-09-21 VITALS — BP 140/84 | HR 69 | Ht 66.0 in | Wt 212.6 lb

## 2021-09-21 DIAGNOSIS — M5442 Lumbago with sciatica, left side: Secondary | ICD-10-CM

## 2021-09-21 DIAGNOSIS — G8929 Other chronic pain: Secondary | ICD-10-CM

## 2021-09-21 MED ORDER — TIZANIDINE HCL 2 MG PO CAPS: 2.0000 mg | ORAL_CAPSULE | Freq: Three times a day (TID) | ORAL | 2 refills | Status: DC

## 2021-09-21 MED ORDER — TIZANIDINE HCL 4 MG PO TABS
2.0000 mg | ORAL_TABLET | Freq: Three times a day (TID) | ORAL | 0 refills | Status: DC | PRN
Start: 2021-09-21 — End: 2021-11-02

## 2021-09-21 MED ORDER — GABAPENTIN 300 MG PO CAPS: 300.0000 mg | ORAL_CAPSULE | Freq: Every day | ORAL | 2 refills | Status: DC

## 2021-09-21 NOTE — Patient Instructions (Addendum)
Thank you for coming to the office today.  Referral to [facility] [place], [place]  [id number] Phone: [phone]  Start Gabapentin 300mg  now before bed, after 2-3 days go up to 2 pills = 600mg  at bedtime, can help sleep  May also take a Gabapentin during day if tolerated as needed  Start Tizanidine muscle relaxant in evening, 1-2 pills per dose as needed up to 3 times a day, caution sedation  Please schedule a Follow-up Appointment to: Return in about 6 weeks (around 11/02/2021), or if symptoms worsen or fail to improve, for low back pain as needed.  If you have any other questions or concerns, please feel free to call the office or send a message through [place]. You may also schedule an earlier appointment if necessary.  Additionally, you may be receiving a survey about your experience at our office within a few days to 1 week by e-mail or mail. We value your feedback.  Nobie Putnam, DO [facility]

## 2021-09-21 NOTE — Progress Notes (Signed)
Subjective:    Patient ID: Cheryl Payne, female    DOB: 06-01-1966, 55 y.o.   MRN: [id number]  Cheryl Payne is a 55 y.o. female presenting on 09/21/2021 for Sciatica   HPI  LOW BACK PAIN, Sciatica, Left sided - Reports symptoms started about 3 months ago without inciting injury, she had low back pain radiating into buttocks and left leg with sciatica  - Last visit with me 08/2021, for initial visit for same problem also discussed sciatica, treated with Prednisone taper, Methocarbamol muscle relaxant, Meloxicam, see prior notes for background information.  - Interval update with limited improvement, some days better other are worse. Prednisone was helpful at the time, but the other medications were not beneficial.  Previously tried Gabapentin $RemoveBeforeDE'100mg'aWoYrtXcrNjuoCZ$  2-3 times a day limited relief  - She has been working with Physical therapy at gym, medicare covering for past 4 weeks, some mixed improvement. Tried acupuncture last week   Today seems to be persistent compared to original onset 3 months. Describes pain as throbbing in buttocks and deeper low back buttocks area, and some paresthesia pain radiating into left lower extremity, severity 5-6 out of 10 when initial or mild and then severe episode up to 10/10  She feels symptoms onset when ambulating and improved if lean forward over grocery cart.   Prior X-ray lumbar spine 08/2021  - Admits difficulty sleeping due to pain  - Denies any fevers/chills, numbness, tingling, weakness, loss of control bladder/bowel incontinence or retention, unintentional wt loss, night sweats    Depression screen [facility] 2/9 09/21/2021 08/24/2021 08/17/2021  Decreased Interest 1 1 0  Down, Depressed, Hopeless 0 0 0  PHQ - 2 Score 1 1 0  Altered sleeping $RemoveBeforeDE'2 2 2  'jioWRyQbUIfadgz$ Tired, decreased energy $RemoveBeforeDE'1 1 1  'qoOxrbJePaVkYui$ Change in appetite 0 1 1  Feeling bad or failure about yourself  0 0 0  Trouble concentrating 0 0 0  Moving slowly or fidgety/restless 0 0 0  Suicidal thoughts 0 0 0   PHQ-9 Score $RemoveBef'4 5 4  'edoEYTlhSj$ Difficult doing work/chores Not difficult at all Not difficult at all Not difficult at all    Social History   Tobacco Use   Smoking status: Former    Packs/day: 1.00    Years: 35.00    Pack years: 35.00    Types: Cigarettes   Smokeless tobacco: Former    Quit date: 06/04/2018  Substance Use Topics   Alcohol use: Never   Drug use: Never    Review of Systems Per HPI unless specifically indicated above     Objective:    BP 140/84   Pulse 69   Ht $R'5\' 6"'ZU$  (1.676 m)   Wt 212 lb 9.6 oz (96.4 kg)   SpO2 98%   BMI 34.31 kg/m   Wt Readings from Last 3 Encounters:  09/21/21 212 lb 9.6 oz (96.4 kg)  08/24/21 213 lb 9.6 oz (96.9 kg)  08/17/21 214 lb (97.1 kg)    Physical Exam Vitals and nursing note reviewed.  Constitutional:      General: She is not in acute distress.    Appearance: Normal appearance. She is well-developed. She is not diaphoretic.     Comments: Well-appearing, comfortable, cooperative  HENT:     Head: Normocephalic and atraumatic.  Eyes:     General:        Right eye: No discharge.        Left eye: No discharge.     Conjunctiva/sclera: Conjunctivae normal.  Cardiovascular:  Rate and Rhythm: Normal rate.  Pulmonary:     Effort: Pulmonary effort is normal.  Skin:    General: Skin is warm and dry.     Findings: No erythema or rash.  Neurological:     Mental Status: She is alert and oriented to person, place, and time.  Psychiatric:        Mood and Affect: Mood normal.        Behavior: Behavior normal.        Thought Content: Thought content normal.     Comments: Well groomed, good eye contact, normal speech and thoughts    I have personally reviewed the radiology report from 08/17/21 Lumbar X-ray.  DG Lumbar Spine CompletePerformed 08/17/2021 Final result  Study Result CLINICAL DATA: Left lower back and lower extremity pain.  EXAM: LUMBAR SPINE - COMPLETE 4+ VIEW  COMPARISON: None.  FINDINGS: There is no evidence of  lumbar spine fracture. Alignment is normal. Intervertebral disc spaces are maintained. Mild anterior osteophyte formation is noted at L3-4.  IMPRESSION: Mild degenerative changes are noted at L3-4. No acute abnormalities noted.   Electronically Signed By: Marijo Conception M.D. On: 08/18/2021 09:17   Results for orders placed or performed in visit on 08/16/21  TSH  Result Value Ref Range   TSH 1.14 0.40 - 4.50 mIU/L  Lipid panel  Result Value Ref Range   Cholesterol 203 (H) <200 mg/dL   HDL 56 > OR = 50 mg/dL   Triglycerides 157 (H) <150 mg/dL   LDL Cholesterol (Calc) 120 (H) mg/dL (calc)   Total CHOL/HDL Ratio 3.6 <5.0 (calc)   Non-HDL Cholesterol (Calc) 147 (H) <130 mg/dL (calc)  COMPLETE METABOLIC PANEL WITH GFR  Result Value Ref Range   Glucose, Bld 84 65 - 139 mg/dL   BUN 14 7 - 25 mg/dL   Creat 0.78 0.50 - 1.03 mg/dL   eGFR 89 > OR = 60 mL/min/1.21m2   BUN/Creatinine Ratio NOT APPLICABLE 6 - 22 (calc)   Sodium 140 135 - 146 mmol/L   Potassium 4.9 3.5 - 5.3 mmol/L   Chloride 104 98 - 110 mmol/L   CO2 29 20 - 32 mmol/L   Calcium 9.5 8.6 - 10.4 mg/dL   Total Protein 6.7 6.1 - 8.1 g/dL   Albumin 4.1 3.6 - 5.1 g/dL   Globulin 2.6 1.9 - 3.7 g/dL (calc)   AG Ratio 1.6 1.0 - 2.5 (calc)   Total Bilirubin 0.3 0.2 - 1.2 mg/dL   Alkaline phosphatase (APISO) 89 37 - 153 U/L   AST 19 10 - 35 U/L   ALT 16 6 - 29 U/L  CBC with Differential/Platelet  Result Value Ref Range   WBC 5.8 3.8 - 10.8 Thousand/uL   RBC 4.20 3.80 - 5.10 Million/uL   Hemoglobin 13.0 11.7 - 15.5 g/dL   HCT 39.1 35.0 - 45.0 %   MCV 93.1 80.0 - 100.0 fL   MCH 31.0 27.0 - 33.0 pg   MCHC 33.2 32.0 - 36.0 g/dL   RDW 14.7 11.0 - 15.0 %   Platelets 272 140 - 400 Thousand/uL   MPV 10.6 7.5 - 12.5 fL   Neutro Abs 1,873 1,500 - 7,800 cells/uL   Lymphs Abs 3,318 850 - 3,900 cells/uL   Absolute Monocytes 499 200 - 950 cells/uL   Eosinophils Absolute 58 15 - 500 cells/uL   Basophils Absolute 52 0 - 200  cells/uL   Neutrophils Relative % 32.3 %   Total Lymphocyte 57.2 %  Monocytes Relative 8.6 %   Eosinophils Relative 1.0 %   Basophils Relative 0.9 %  Hemoglobin A1c  Result Value Ref Range   Hgb A1c MFr Bld 5.6 <5.7 % of total Hgb   Mean Plasma Glucose 114 mg/dL   eAG (mmol/L) 6.3 mmol/L      Assessment & Plan:   Problem List Items Addressed This Visit     Chronic left-sided low back pain with left-sided sciatica - Primary   Relevant Medications   tizanidine (ZANAFLEX) 2 MG capsule   gabapentin (NEURONTIN) 300 MG capsule   Other Relevant Orders   Ambulatory referral to Orthopedic Surgery    Subacute on chronic L LBP with associated L sciatica X 3 months now No known injury X-ray reviewed 08/2021 above L3-4 DDD History suspicious for spinal stenosis and other disc etiology with sciatica impingement Not responding to conservative therapy Failed low dose gabapentin, methocarbamol, flexeril, already tried prednisone taper, meloxicam  Plan: 1. Switch / Start muscle relaxant with Tizanidine 2-$RemoveBeforeDEI'4mg'tJOnpOZRNgtPcAxI$  TID PRN 2. Restart Gabapentin titration higher dose $RemoveBef'300mg'fKwRjABknB$  caps QHS and then up to $Rem'600mg'oAeB$  QHS may take 1 PRN during day 3. May use Tylenol PRN for breakthrough 4. Encouraged use of heating pad 1-2x daily for now then PRN 5. Referral to Orthopedics at [facility], may warrant MRI and other management, she is already doing physical therapy - consider spine vs physiatry through [place].   Orders Placed This Encounter  Procedures   Ambulatory referral to Orthopedic Surgery    Referral Priority:   Routine    Referral Type:   Surgical    Referral Reason:   Specialty Services Required    Requested Specialty:   Orthopedic Surgery    Number of Visits Requested:   1     Meds ordered this encounter  Medications   tizanidine (ZANAFLEX) 2 MG capsule    Sig: Take 1-2 capsules (2-4 mg total) by mouth 3 (three) times daily.    Dispense:  60 capsule    Refill:  2   gabapentin (NEURONTIN)  300 MG capsule    Sig: Take 1 capsule (300 mg total) by mouth at bedtime. May increase to 2 capsules for $RemoveBefo'600mg'oxSQRGZtLAy$  at bedtime as tolerated. Also may take 1 capsule during day as needed.    Dispense:  90 capsule    Refill:  2      Follow up plan: Return in about 6 weeks (around 11/02/2021), or if symptoms worsen or fail to improve, for low back pain as needed.   Nobie Putnam, [facility] 09/21/2021, 11:07 AM

## 2021-09-21 NOTE — Telephone Encounter (Signed)
Pt states Medicare will not pay for tizanidine (ZANAFLEX) 2 MG CAPSULES  But they will pay for the TABLETS.  Dr Raliegh Ip, will you please re send to  [place], [facility]

## 2021-09-24 DIAGNOSIS — M5431 Sciatica, right side: Secondary | ICD-10-CM | POA: Diagnosis not present

## 2021-09-27 DIAGNOSIS — M5431 Sciatica, right side: Secondary | ICD-10-CM | POA: Diagnosis not present

## 2021-10-01 DIAGNOSIS — M5431 Sciatica, right side: Secondary | ICD-10-CM | POA: Diagnosis not present

## 2021-10-05 DIAGNOSIS — M5431 Sciatica, right side: Secondary | ICD-10-CM | POA: Diagnosis not present

## 2021-10-07 DIAGNOSIS — M5431 Sciatica, right side: Secondary | ICD-10-CM | POA: Diagnosis not present

## 2021-10-11 ENCOUNTER — Other Ambulatory Visit (HOSPITAL_COMMUNITY): Payer: Self-pay | Admitting: Sports Medicine

## 2021-10-11 ENCOUNTER — Other Ambulatory Visit: Payer: Self-pay | Admitting: Sports Medicine

## 2021-10-11 DIAGNOSIS — M5442 Lumbago with sciatica, left side: Secondary | ICD-10-CM | POA: Diagnosis not present

## 2021-10-11 DIAGNOSIS — G8929 Other chronic pain: Secondary | ICD-10-CM | POA: Diagnosis not present

## 2021-10-11 DIAGNOSIS — M5136 Other intervertebral disc degeneration, lumbar region: Secondary | ICD-10-CM | POA: Diagnosis not present

## 2021-10-12 DIAGNOSIS — M5431 Sciatica, right side: Secondary | ICD-10-CM | POA: Diagnosis not present

## 2021-10-15 DIAGNOSIS — M5431 Sciatica, right side: Secondary | ICD-10-CM | POA: Diagnosis not present

## 2021-10-19 DIAGNOSIS — M5431 Sciatica, right side: Secondary | ICD-10-CM | POA: Diagnosis not present

## 2021-10-21 ENCOUNTER — Ambulatory Visit
Admission: RE | Admit: 2021-10-21 | Discharge: 2021-10-21 | Disposition: A | Discharge status: Home / Self Care | Payer: Medicare Other | Source: Ambulatory Visit | Attending: Sports Medicine | Admitting: Sports Medicine

## 2021-10-21 ENCOUNTER — Other Ambulatory Visit: Payer: Self-pay

## 2021-10-21 DIAGNOSIS — M5442 Lumbago with sciatica, left side: Secondary | ICD-10-CM | POA: Diagnosis not present

## 2021-10-21 DIAGNOSIS — M5136 Other intervertebral disc degeneration, lumbar region: Secondary | ICD-10-CM | POA: Insufficient documentation

## 2021-10-21 DIAGNOSIS — R200 Anesthesia of skin: Secondary | ICD-10-CM | POA: Diagnosis not present

## 2021-10-21 DIAGNOSIS — G8929 Other chronic pain: Secondary | ICD-10-CM | POA: Insufficient documentation

## 2021-10-21 DIAGNOSIS — M545 Low back pain, unspecified: Secondary | ICD-10-CM | POA: Diagnosis not present

## 2021-10-22 DIAGNOSIS — M5431 Sciatica, right side: Secondary | ICD-10-CM | POA: Diagnosis not present

## 2021-10-25 DIAGNOSIS — M5431 Sciatica, right side: Secondary | ICD-10-CM | POA: Diagnosis not present

## 2021-10-31 ENCOUNTER — Other Ambulatory Visit: Payer: Self-pay | Admitting: Family Medicine

## 2021-10-31 DIAGNOSIS — G8929 Other chronic pain: Secondary | ICD-10-CM

## 2021-11-01 NOTE — Telephone Encounter (Signed)
Requested medications are due for refill today.  yes  Requested medications are on the active medications list.  yes  Last refill. 09/21/2021  Future visit scheduled.   no  Notes to clinic.  Medication not delegated.

## 2021-11-03 DIAGNOSIS — M5431 Sciatica, right side: Secondary | ICD-10-CM | POA: Diagnosis not present

## 2021-11-05 DIAGNOSIS — M5416 Radiculopathy, lumbar region: Secondary | ICD-10-CM | POA: Diagnosis not present

## 2021-11-05 DIAGNOSIS — M5126 Other intervertebral disc displacement, lumbar region: Secondary | ICD-10-CM | POA: Diagnosis not present

## 2021-11-08 DIAGNOSIS — M5431 Sciatica, right side: Secondary | ICD-10-CM | POA: Diagnosis not present

## 2021-11-12 DIAGNOSIS — M5431 Sciatica, right side: Secondary | ICD-10-CM | POA: Diagnosis not present

## 2021-11-15 DIAGNOSIS — M5431 Sciatica, right side: Secondary | ICD-10-CM | POA: Diagnosis not present

## 2021-11-19 DIAGNOSIS — M5431 Sciatica, right side: Secondary | ICD-10-CM | POA: Diagnosis not present

## 2021-11-24 DIAGNOSIS — M5431 Sciatica, right side: Secondary | ICD-10-CM | POA: Diagnosis not present

## 2021-11-29 DIAGNOSIS — M5431 Sciatica, right side: Secondary | ICD-10-CM | POA: Diagnosis not present

## 2021-11-30 ENCOUNTER — Other Ambulatory Visit: Payer: Self-pay | Admitting: Family Medicine

## 2021-11-30 DIAGNOSIS — Z1231 Encounter for screening mammogram for malignant neoplasm of breast: Secondary | ICD-10-CM

## 2021-12-01 ENCOUNTER — Telehealth: Payer: Self-pay | Admitting: Obstetrics and Gynecology

## 2021-12-01 DIAGNOSIS — M5431 Sciatica, right side: Secondary | ICD-10-CM | POA: Diagnosis not present

## 2021-12-01 NOTE — Telephone Encounter (Signed)
Patient called. Patient advised.

## 2021-12-01 NOTE — Telephone Encounter (Signed)
Pt called asking when her last actual pap smear was. Please Advise.

## 2021-12-06 ENCOUNTER — Other Ambulatory Visit: Payer: Self-pay | Admitting: Family Medicine

## 2021-12-06 DIAGNOSIS — G8929 Other chronic pain: Secondary | ICD-10-CM

## 2021-12-06 DIAGNOSIS — M5442 Lumbago with sciatica, left side: Secondary | ICD-10-CM

## 2021-12-07 DIAGNOSIS — M5431 Sciatica, right side: Secondary | ICD-10-CM | POA: Diagnosis not present

## 2021-12-07 NOTE — Telephone Encounter (Signed)
Requested medications are due for refill today.  unsure  Requested medications are on the active medications list.  no  Last refill. 08/24/2021  Future visit scheduled.   no  Notes to clinic.  Medication was d/c'd on 09/21/2021.    Requested Prescriptions  Pending Prescriptions Disp Refills   meloxicam (MOBIC) 15 MG tablet [Pharmacy Med Name: MELOXICAM 15MG  TABLETS] 30 tablet 2    Sig: TAKE 1 TABLET(15 MG) BY MOUTH DAILY AS NEEDED FOR PAIN     Analgesics:  COX2 Inhibitors Passed - 12/06/2021  9:09 AM      Passed - HGB in normal range and within 360 days    Hemoglobin  Date Value Ref Range Status  08/17/2021 13.0 11.7 - 15.5 g/dL Final          Passed - Cr in normal range and within 360 days    Creat  Date Value Ref Range Status  08/17/2021 0.78 0.50 - 1.03 mg/dL Final          Passed - Patient is not pregnant      Passed - Valid encounter within last 12 months    Recent Outpatient Visits           2 months ago Chronic left-sided low back pain with left-sided sciatica   [place], DO   3 months ago Annual physical exam   [place], DO   3 months ago Chronic left-sided low back pain with left-sided sciatica   [facility] [place], Coralie Keens, NP   5 months ago Acute cystitis with hematuria   [place], DO   5 months ago [facility] [employer] preventive visit   [place], Devonne Doughty, DO

## 2021-12-08 DIAGNOSIS — M5416 Radiculopathy, lumbar region: Secondary | ICD-10-CM | POA: Diagnosis not present

## 2021-12-08 DIAGNOSIS — M5126 Other intervertebral disc displacement, lumbar region: Secondary | ICD-10-CM | POA: Diagnosis not present

## 2021-12-13 DIAGNOSIS — M5431 Sciatica, right side: Secondary | ICD-10-CM | POA: Diagnosis not present

## 2021-12-17 DIAGNOSIS — M5431 Sciatica, right side: Secondary | ICD-10-CM | POA: Diagnosis not present

## 2021-12-20 DIAGNOSIS — M5431 Sciatica, right side: Secondary | ICD-10-CM | POA: Diagnosis not present

## 2021-12-23 ENCOUNTER — Other Ambulatory Visit (HOSPITAL_COMMUNITY)
Admission: RE | Admit: 2021-12-23 | Discharge: 2021-12-23 | Disposition: A | Discharge status: Home / Self Care | Payer: Medicare Other | Source: Ambulatory Visit | Attending: Family Medicine | Admitting: Family Medicine

## 2021-12-23 ENCOUNTER — Other Ambulatory Visit: Payer: Self-pay | Admitting: Family Medicine

## 2021-12-23 ENCOUNTER — Other Ambulatory Visit: Payer: Self-pay

## 2021-12-23 ENCOUNTER — Ambulatory Visit (INDEPENDENT_AMBULATORY_CARE_PROVIDER_SITE_OTHER): Payer: Medicare Other | Admitting: Family Medicine

## 2021-12-23 ENCOUNTER — Ambulatory Visit (INDEPENDENT_AMBULATORY_CARE_PROVIDER_SITE_OTHER): Payer: Medicare Other | Admitting: Podiatry

## 2021-12-23 ENCOUNTER — Encounter: Payer: Self-pay | Admitting: Family Medicine

## 2021-12-23 VITALS — BP 124/75 | HR 79 | Ht 66.0 in | Wt 210.2 lb

## 2021-12-23 DIAGNOSIS — R7303 Prediabetes: Secondary | ICD-10-CM | POA: Diagnosis not present

## 2021-12-23 DIAGNOSIS — R350 Frequency of micturition: Secondary | ICD-10-CM | POA: Diagnosis not present

## 2021-12-23 DIAGNOSIS — Z79899 Other long term (current) drug therapy: Secondary | ICD-10-CM

## 2021-12-23 DIAGNOSIS — N898 Other specified noninflammatory disorders of vagina: Secondary | ICD-10-CM | POA: Diagnosis present

## 2021-12-23 DIAGNOSIS — Z0000 Encounter for general adult medical examination without abnormal findings: Secondary | ICD-10-CM

## 2021-12-23 DIAGNOSIS — B351 Tinea unguium: Secondary | ICD-10-CM

## 2021-12-23 DIAGNOSIS — I10 Essential (primary) hypertension: Secondary | ICD-10-CM

## 2021-12-23 DIAGNOSIS — E7800 Pure hypercholesterolemia, unspecified: Secondary | ICD-10-CM

## 2021-12-23 DIAGNOSIS — R319 Hematuria, unspecified: Secondary | ICD-10-CM | POA: Diagnosis not present

## 2021-12-23 LAB — POCT URINALYSIS DIPSTICK
Bilirubin, UA: NEGATIVE
Glucose, UA: NEGATIVE
Ketones, UA: NEGATIVE
Nitrite, UA: NEGATIVE
Protein, UA: NEGATIVE
Spec Grav, UA: 1.02 (ref 1.010–1.025)
Urobilinogen, UA: 0.2 E.U./dL
pH, UA: 5 (ref 5.0–8.0)

## 2021-12-23 NOTE — Progress Notes (Signed)
Subjective:  Patient ID: Cheryl Payne, female    DOB: [birth year],  MRN: [id number]  Chief Complaint  Patient presents with   Nail Problem    Nail causing discomfort     56 y.o. female presents with the above complaint.  Patient presents with complaint bilateral hallux thickened elongated dystrophic nail.  Mild pain on palpation.  Patient would like to discuss treatment options for this.  She has tried some over-the-counter medication which is helped.  She denies any other acute complaints.  She has not seen anyone as prior to seeing me.  She has not tried any oral medications.  She would like to discuss this fungus treatment   Review of Systems: Negative except as noted in the HPI. Denies N/V/F/Ch.  Past Medical History:  Diagnosis Date   Cancer ([place])    lung   Chronic left shoulder pain     Current Outpatient Medications:    amLODipine (NORVASC) 5 MG tablet, Take 1 tablet (5 mg total) by mouth daily., Disp: 90 tablet, Rfl: 3   aspirin EC 81 MG tablet, Take 81 mg by mouth daily. Swallow whole., Disp: , Rfl:    escitalopram (LEXAPRO) 10 MG tablet, TAKE 1 TABLET(10 MG) BY MOUTH DAILY. START ONCE FINISHED CITALOPRAM 10 MG DOSAGE TAPER OFF, Disp: 30 tablet, Rfl: 5   gabapentin (NEURONTIN) 300 MG capsule, Take 1 capsule (300 mg total) by mouth at bedtime. May increase to 2 capsules for 600mg  at bedtime as tolerated. Also may take 1 capsule during day as needed., Disp: 90 capsule, Rfl: 2   meloxicam (MOBIC) 15 MG tablet, TAKE 1 TABLET(15 MG) BY MOUTH DAILY AS NEEDED FOR PAIN, Disp: 30 tablet, Rfl: 2   metoprolol tartrate (LOPRESSOR) 25 MG tablet, Take 1 tablet (25 mg total) by mouth 2 (two) times daily., Disp: 180 tablet, Rfl: 3   rosuvastatin (CRESTOR) 10 MG tablet, Take 1 tablet (10 mg total) by mouth daily., Disp: 90 tablet, Rfl: 3  Social History   Tobacco Use  Smoking Status Former   Packs/day: 1.00   Years: 35.00   Pack years: 35.00   Types: Cigarettes  Smokeless Tobacco  Former   Quit date: 06/04/2018    No Known Allergies Objective:  There were no vitals filed for this visit. There is no height or weight on file to calculate BMI. Constitutional Well developed. Well nourished.  Vascular Dorsalis pedis pulses palpable bilaterally. Posterior tibial pulses palpable bilaterally. Capillary refill normal to all digits.  No cyanosis or clubbing noted. Pedal hair growth normal.  Neurologic Normal speech. Oriented to person, place, and time. Epicritic sensation to light touch grossly present bilaterally.  Dermatologic Nails thickened elongated dystrophic toenails x2 bilateral hallux.  Mild pain on palpation.  Within normal limits Skin within normal limits  Orthopedic: Normal joint ROM without pain or crepitus bilaterally. No visible deformities. No bony tenderness.   Radiographs: None Assessment:   1. Long-term use of high-risk medication   2. Nail fungus   3. Onychomycosis due to dermatophyte    Plan:  Patient was evaluated and treated and all questions answered.  Bilateral hallux onychomycosis -Educated the patient on the etiology of onychomycosis and various treatment options associated with improving the fungal load.  I explained to the patient that there is 3 treatment options available to treat the onychomycosis including topical, p.o., laser treatment.  Patient elected to undergo p.o. options with Lamisil/terbinafine therapy.  In order for me to start the medication therapy, I explained to the patient  the importance of evaluating the liver and obtaining the liver function test.  Once the liver function test comes back normal I will start him on 57-month course of Lamisil therapy.  Patient understood all risk and would like to proceed with Lamisil therapy.  I have asked the patient to immediately stop the Lamisil therapy if she has any reactions to it and call the office or go to the emergency room right away.  Patient states understanding   No  follow-ups on file.

## 2021-12-23 NOTE — Progress Notes (Signed)
Subjective:    Patient ID: Cheryl Payne, female    DOB: 05/09/1966, 56 y.o.   MRN: [id number]  Cheryl Payne is a 56 y.o. female presenting on 12/23/2021 for No chief complaint on file.   HPI  Vaginal Discharge Prior UTI 06/2021 She admits recurrence with vaginal discharge odor and irritation. No new sexual intercourse or other triggers Previous treatment for UTI with Keflex and Diflucan, resolved  PreDM Due for A1c today  Depression screen [facility] 2/9 12/23/2021 09/21/2021 08/24/2021  Decreased Interest 0 1 1  Down, Depressed, Hopeless 1 0 0  PHQ - 2 Score 1 1 1   Altered sleeping 1 2 2   Tired, decreased energy 1 1 1   Change in appetite 1 0 1  Feeling bad or failure about yourself  0 0 0  Trouble concentrating 0 0 0  Moving slowly or fidgety/restless 0 0 0  Suicidal thoughts 0 0 0  PHQ-9 Score 4 4 5   Difficult doing work/chores Not difficult at all Not difficult at all Not difficult at all    Social History   Tobacco Use   Smoking status: Former    Packs/day: 1.00    Years: 35.00    Pack years: 35.00    Types: Cigarettes   Smokeless tobacco: Former    Quit date: 06/04/2018  Substance Use Topics   Alcohol use: Never   Drug use: Never    Review of Systems Per HPI unless specifically indicated above     Objective:    BP 124/75    Pulse 79    Ht 5\' 6"  (1.676 m)    Wt 210 lb 3.2 oz (95.3 kg)    SpO2 96%    BMI 33.93 kg/m   Wt Readings from Last 3 Encounters:  12/23/21 210 lb 3.2 oz (95.3 kg)  09/21/21 212 lb 9.6 oz (96.4 kg)  08/24/21 213 lb 9.6 oz (96.9 kg)    Physical Exam Vitals and nursing note reviewed.  Constitutional:      General: She is not in acute distress.    Appearance: Normal appearance. She is well-developed. She is not diaphoretic.     Comments: Well-appearing, comfortable, cooperative  HENT:     Head: Normocephalic and atraumatic.  Eyes:     General:        Right eye: No discharge.        Left eye: No discharge.     Conjunctiva/sclera:  Conjunctivae normal.  Cardiovascular:     Rate and Rhythm: Normal rate.  Pulmonary:     Effort: Pulmonary effort is normal.  Skin:    General: Skin is warm and dry.     Findings: No erythema or rash.  Neurological:     Mental Status: She is alert and oriented to person, place, and time.  Psychiatric:        Mood and Affect: Mood normal.        Behavior: Behavior normal.        Thought Content: Thought content normal.     Comments: Well groomed, good eye contact, normal speech and thoughts   Results for orders placed or performed in visit on 12/23/21  POCT Urinalysis Dipstick  Result Value Ref Range   Color, UA Yellow    Clarity, UA Cloudy    Glucose, UA Negative Negative   Bilirubin, UA Negative    Ketones, UA Negative    Spec Grav, UA 1.020 1.010 - 1.025   Blood, UA Trace    pH, UA  5.0 5.0 - 8.0   Protein, UA Negative Negative   Urobilinogen, UA 0.2 0.2 or 1.0 E.U./dL   Nitrite, UA Negative    Leukocytes, UA Trace (A) Negative   Appearance     Odor    POCT glycosylated hemoglobin (Hb A1C)  Result Value Ref Range   Hemoglobin A1C 5.7 (A) 4.0 - 5.6 %      Assessment & Plan:   Problem List Items Addressed This Visit   None Visit Diagnoses     Vaginal discharge    -  Primary   Relevant Orders   Cervicovaginal ancillary only   Urinary frequency       Relevant Orders   POCT Urinalysis Dipstick (Completed)   Urine Culture   Vaginal odor       Relevant Orders   Cervicovaginal ancillary only   Pre-diabetes       Relevant Orders   POCT glycosylated hemoglobin (Hb A1C) (Completed)       Vaginitis Prior UTI, today without significant urinary symptoms Will pursue urinalysis, urine culture Self collect vaginal swab Treat accordingly pending results. Handout on BV / Yeast given  PreDM Due for POC A1c today will follow up result  No orders of the defined types were placed in this encounter.     Follow up plan: Return in about 8 months (around 08/23/2022)  for 8 months fasting lab only then 1 week later Annual Physical.   Nobie Putnam, DO [facility] 12/23/2021, 10:47 AM

## 2021-12-23 NOTE — Patient Instructions (Addendum)
Thank you for coming to the office today.   1. For Bacterial Vaginosis (BV)   IF we diagnose this then the treatment is - Metronidazole 500mg  twice daily for 7 days, do not drink alcohol on this med as it will cause nausea/vomiting. - This problem can be recurrent. Some people are more likely to get it than others, and it has to do with normal body chemistry and vaginal environment. One of the biggest risk factors for causing BV is unprotected sexual intercourse (without a condom), because semen can change the chemistry of your vaginal environment, causing the "good bacteria" reduce and the other bacteria to grow and cause your symptoms. Recommend to start using condoms with intercourse to see if this helps reduce your symptoms, do this especially for next 1 week while on treatment. Some other recommendations include taking a daily probiotic and yogurt can help reduce BV as well, this is not proven to work in all patients.  If your symptoms do not resolve with the treatment, we will need to re-evaluate you to make sure that you didn't develop a yeast infection or other concerns. If you do not tolerate the pills, we can switch your prescription to topical Metrogel, please notify us if you need this change.   Your symptoms sound consistent with a Vaginitis - irritation from likely BV or yeast.  Tested wet prep today looking for BV, Yeast, and Trichomonas - will call you with results. - If needed will send in Metronidazole 500mg  twice daily for 7 days (for either BV or Trich) no alcohol on this med. - If positive for Yeast - will send Diflucan 150mg  pill, take 1 and then on Day 3 take 2nd pill. - If both medicines sent, finish Metronidazole FIRST then take Diflucan.  DUE for FASTING BLOOD WORK (no food or drink after midnight before the lab appointment, only water or coffee without cream/sugar on the morning of)  SCHEDULE "Lab Only" visit in the morning at the clinic for lab draw in 8 MONTHS   -  Make sure Lab Only appointment is at about 1 week before your next appointment, so that results will be available  For Lab Results, once available within 2-3 days of blood draw, you can can log in to MyChart online to view your results and a brief explanation. Also, we can discuss results at next follow-up visit.    Please schedule a Follow-up Appointment to: Return in about 8 months (around 08/23/2022) for 8 months fasting lab only then 1 week later Annual Physical.  If you have any other questions or concerns, please feel free to call the office or send a message through [place]. You may also schedule an earlier appointment if necessary.  Additionally, you may be receiving a survey about your experience at our office within a few days to 1 week by e-mail or mail. We value your feedback.  Nobie Putnam, DO [place]

## 2021-12-24 DIAGNOSIS — M5431 Sciatica, right side: Secondary | ICD-10-CM | POA: Diagnosis not present

## 2021-12-24 LAB — HEPATIC FUNCTION PANEL
ALT: 13 IU/L (ref 0–32)
AST: 20 IU/L (ref 0–40)
Albumin: 4.4 g/dL (ref 3.8–4.9)
Alkaline Phosphatase: 122 IU/L — ABNORMAL HIGH (ref 44–121)
Bilirubin Total: 0.3 mg/dL (ref 0.0–1.2)
Bilirubin, Direct: <0.1 mg/dL (ref 0.00–0.40)
Total Protein: 7.2 g/dL (ref 6.0–8.5)

## 2021-12-24 LAB — POCT GLYCOSYLATED HEMOGLOBIN (HGB A1C): Hemoglobin A1C: 5.7 % — AB (ref 4.0–5.6)

## 2021-12-25 LAB — URINE CULTURE
MICRO NUMBER:: 12896903
SPECIMEN QUALITY:: ADEQUATE

## 2021-12-27 DIAGNOSIS — M5431 Sciatica, right side: Secondary | ICD-10-CM | POA: Diagnosis not present

## 2021-12-27 LAB — CERVICOVAGINAL ANCILLARY ONLY
Bacterial Vaginitis (gardnerella): NEGATIVE
Candida Glabrata: NEGATIVE
Candida Vaginitis: NEGATIVE
Chlamydia: NEGATIVE
Comment: NEGATIVE
Comment: NEGATIVE
Comment: NEGATIVE
Comment: NEGATIVE
Comment: NEGATIVE
Comment: NORMAL
Neisseria Gonorrhea: NEGATIVE
Trichomonas: NEGATIVE

## 2021-12-28 MED ORDER — TERBINAFINE HCL 250 MG PO TABS: 250.0000 mg | ORAL_TABLET | Freq: Every day | ORAL | 0 refills | Status: DC

## 2021-12-28 NOTE — Addendum Note (Signed)
Addended by: Boneta Lucks on: 12/28/2021 09:03 AM   Modules accepted: Orders

## 2021-12-29 DIAGNOSIS — M5431 Sciatica, right side: Secondary | ICD-10-CM | POA: Diagnosis not present

## 2021-12-30 ENCOUNTER — Ambulatory Visit
Admission: RE | Admit: 2021-12-30 | Discharge: 2021-12-30 | Disposition: A | Discharge status: Home / Self Care | Payer: Medicare Other | Source: Ambulatory Visit | Attending: Family Medicine | Admitting: Family Medicine

## 2021-12-30 DIAGNOSIS — Z1231 Encounter for screening mammogram for malignant neoplasm of breast: Secondary | ICD-10-CM | POA: Diagnosis not present

## 2022-01-10 DIAGNOSIS — C3491 Malignant neoplasm of unspecified part of right bronchus or lung: Secondary | ICD-10-CM | POA: Diagnosis not present

## 2022-01-10 DIAGNOSIS — Z85118 Personal history of other malignant neoplasm of bronchus and lung: Secondary | ICD-10-CM | POA: Diagnosis not present

## 2022-01-12 DIAGNOSIS — C3411 Malignant neoplasm of upper lobe, right bronchus or lung: Secondary | ICD-10-CM | POA: Diagnosis not present

## 2022-01-17 DIAGNOSIS — M5431 Sciatica, right side: Secondary | ICD-10-CM | POA: Diagnosis not present

## 2022-01-18 ENCOUNTER — Other Ambulatory Visit: Payer: Self-pay | Admitting: Family Medicine

## 2022-01-18 DIAGNOSIS — G8929 Other chronic pain: Secondary | ICD-10-CM

## 2022-01-18 NOTE — Telephone Encounter (Signed)
Requested Prescriptions  Pending Prescriptions Disp Refills   gabapentin (NEURONTIN) 300 MG capsule [Pharmacy Med Name: GABAPENTIN 300MG  CAPSULES] 90 capsule 2    Sig: TAKE 1 CAPSULE BY MOUTH AT BEDTIME MAY INCREASE TO 2 CAPSULES AT BEDTIME AS TOLERATED. ALSO MAY TAKE 1 CAPSULE DURNING DAY AS NEEDED     Neurology: Anticonvulsants - gabapentin Passed - 01/18/2022  9:34 AM      Passed - Cr in normal range and within 360 days    Creat  Date Value Ref Range Status  08/17/2021 0.78 0.50 - 1.03 mg/dL Final         Passed - Completed PHQ-2 or PHQ-9 in the last 360 days      Passed - Valid encounter within last 12 months    Recent Outpatient Visits          3 weeks ago Vaginal discharge   [place], DO   3 months ago Chronic left-sided low back pain with left-sided sciatica   [place], DO   4 months ago Annual physical exam   [place], DO   5 months ago Chronic left-sided low back pain with left-sided sciatica   [facility] [place], Coralie Keens, NP   6 months ago Acute cystitis with hematuria   [place], Devonne Doughty, DO

## 2022-01-19 DIAGNOSIS — M5431 Sciatica, right side: Secondary | ICD-10-CM | POA: Diagnosis not present

## 2022-01-24 DIAGNOSIS — M5431 Sciatica, right side: Secondary | ICD-10-CM | POA: Diagnosis not present

## 2022-01-26 DIAGNOSIS — M5431 Sciatica, right side: Secondary | ICD-10-CM | POA: Diagnosis not present

## 2022-01-27 ENCOUNTER — Other Ambulatory Visit: Payer: Self-pay | Admitting: *Deleted

## 2022-01-27 NOTE — Patient Instructions (Signed)
Visit Information  [name]  - as a part of your Medicaid benefit, you are eligible for care management and care coordination services at no cost or copay. I was unable to reach you by phone today but would be happy to help you with your health related needs. Please feel free to call me @ [phone].   A member of the Managed Medicaid care management team will reach out to you again over the next 14 days.   Lurena Joiner RN, BSN [place] RN Care Coordinator

## 2022-01-27 NOTE — Patient Outreach (Signed)
Care Coordination  01/27/2022  Paralee Pendergrass Aug 20, 1966 [id number]   Medicaid Managed Care   Unsuccessful Outreach Note  01/27/2022 Name: Versa Craton MRN: [id number] DOB: May 30, 1966  Referred by: [name], DO Reason for referral : High Risk Managed Medicaid (Unsuccessful RNCM initial telephone outreach)   An unsuccessful telephone outreach was attempted today. The patient was referred to the case management team for assistance with care management and care coordination.   Follow Up Plan: A HIPAA compliant phone message was left for the patient providing contact information and requesting a return call.    Medicaid Managed Care   Unsuccessful Outreach Note  01/27/2022 Name: [name] MRN: [id number] DOB: 1966/01/03  Referred by: [name], DO Reason for referral : High Risk Managed Medicaid (Unsuccessful RNCM initial telephone outreach)   An unsuccessful telephone outreach was attempted today. The patient was referred to the case management team for assistance with care management and care coordination.   Follow Up Plan: A HIPAA compliant phone message was left for the patient providing contact information and requesting a return call.   Lurena Joiner RN, BSN [place] RN Care Coordinator

## 2022-01-31 DIAGNOSIS — M5431 Sciatica, right side: Secondary | ICD-10-CM | POA: Diagnosis not present

## 2022-02-04 DIAGNOSIS — M5431 Sciatica, right side: Secondary | ICD-10-CM | POA: Diagnosis not present

## 2022-02-07 DIAGNOSIS — M5431 Sciatica, right side: Secondary | ICD-10-CM | POA: Diagnosis not present

## 2022-02-11 DIAGNOSIS — M5431 Sciatica, right side: Secondary | ICD-10-CM | POA: Diagnosis not present

## 2022-02-14 DIAGNOSIS — M5431 Sciatica, right side: Secondary | ICD-10-CM | POA: Diagnosis not present

## 2022-02-17 ENCOUNTER — Other Ambulatory Visit: Payer: Self-pay | Admitting: Family Medicine

## 2022-02-17 NOTE — Telephone Encounter (Signed)
Requested Prescriptions  ?Pending Prescriptions Disp Refills  ?? amLODipine (NORVASC) 5 MG tablet [Pharmacy Med Name: AMLODIPINE BESYLATE 5MG  TABLETS] 90 tablet 3  ?  Sig: TAKE 1 TABLET(5 MG) BY MOUTH DAILY  ?  ? Cardiovascular: Calcium Channel Blockers 2 Passed - 02/17/2022  6:29 AM  ?  ?  Passed - Last BP in normal range  ?  BP Readings from Last 1 Encounters:  ?12/23/21 124/75  ?   ?  ?  Passed - Last Heart Rate in normal range  ?  Pulse Readings from Last 1 Encounters:  ?12/23/21 79  ?   ?  ?  Passed - Valid encounter within last 6 months  ?  Recent Outpatient Visits   ?      ? 1 month ago Vaginal discharge  ? [place], DO  ? 4 months ago Chronic left-sided low back pain with left-sided sciatica  ? [place], DO  ? 5 months ago Annual physical exam  ? [place], DO  ? 6 months ago Chronic left-sided low back pain with left-sided sciatica  ? [facility] [place], [place], NP  ? 7 months ago Acute cystitis with hematuria  ? [place], DO  ?  ?  ? ?  ?  ?  ?? escitalopram (LEXAPRO) 10 MG tablet [Pharmacy Med Name: ESCITALOPRAM 10MG  TABLETS] 30 tablet 5  ?  Sig: TAKE 1 TABLET(10 MG) BY MOUTH DAILY. START ONCE FINISHED CITALOPRAM 10 MG DOSAGE TAPER OFF  ?  ? Psychiatry:  Antidepressants - SSRI Passed - 02/17/2022  6:29 AM  ?  ?  Passed - Completed PHQ-2 or PHQ-9 in the last 360 days  ?  ?  Passed - Valid encounter within last 6 months  ?  Recent Outpatient Visits   ?      ? 1 month ago Vaginal discharge  ? [place], DO  ? 4 months ago Chronic left-sided low back pain with left-sided sciatica  ? [place], DO  ? 5 months ago Annual physical exam  ? [place], DO  ? 6 months ago Chronic left-sided low back  pain with left-sided sciatica  ? [facility] [place], [place] W, NP  ? 7 months ago Acute cystitis with hematuria  ? [place], DO  ?  ?  ? ?  ?  ?  ? ?

## 2022-02-17 NOTE — Telephone Encounter (Signed)
Requested medication (s) are due for refill today: Yes ? ?Requested medication (s) are on the active medication list: Yes ? ?Last refill:  02/10/21 ? ?Future visit scheduled: No ? ?Notes to clinic:  Prescription expired. ? ? ? ?Requested Prescriptions  ?Pending Prescriptions Disp Refills  ? amLODipine (NORVASC) 5 MG tablet [Pharmacy Med Name: AMLODIPINE BESYLATE 5MG  TABLETS] 90 tablet 3  ?  Sig: TAKE 1 TABLET(5 MG) BY MOUTH DAILY  ?  ? Cardiovascular: Calcium Channel Blockers 2 Passed - 02/17/2022  6:29 AM  ?  ?  Passed - Last BP in normal range  ?  BP Readings from Last 1 Encounters:  ?12/23/21 124/75  ?  ?  ?  ?  Passed - Last Heart Rate in normal range  ?  Pulse Readings from Last 1 Encounters:  ?12/23/21 79  ?  ?  ?  ?  Passed - Valid encounter within last 6 months  ?  Recent Outpatient Visits   ? ?      ? 1 month ago Vaginal discharge  ? [place], DO  ? 4 months ago Chronic left-sided low back pain with left-sided sciatica  ? [place], DO  ? 5 months ago Annual physical exam  ? [place], DO  ? 6 months ago Chronic left-sided low back pain with left-sided sciatica  ? [facility] [place], [place] W, NP  ? 7 months ago Acute cystitis with hematuria  ? [place], DO  ? ?  ?  ? ?  ?  ?  ?Signed Prescriptions Disp Refills  ? escitalopram (LEXAPRO) 10 MG tablet 30 tablet 5  ?  Sig: TAKE 1 TABLET(10 MG) BY MOUTH DAILY. START ONCE FINISHED CITALOPRAM 10 MG DOSAGE TAPER OFF  ?  ? Psychiatry:  Antidepressants - SSRI Passed - 02/17/2022  6:29 AM  ?  ?  Passed - Completed PHQ-2 or PHQ-9 in the last 360 days  ?  ?  Passed - Valid encounter within last 6 months  ?  Recent Outpatient Visits   ? ?      ? 1 month ago Vaginal discharge  ? [place], DO  ? 4 months ago Chronic left-sided low back pain with  left-sided sciatica  ? [place], DO  ? 5 months ago Annual physical exam  ? [place], DO  ? 6 months ago Chronic left-sided low back pain with left-sided sciatica  ? [facility] [place], [place] W, NP  ? 7 months ago Acute cystitis with hematuria  ? [place], DO  ? ?  ?  ? ?  ?  ?  ? ?

## 2022-02-18 DIAGNOSIS — M5431 Sciatica, right side: Secondary | ICD-10-CM | POA: Diagnosis not present

## 2022-02-21 ENCOUNTER — Telehealth: Payer: Self-pay | Admitting: *Deleted

## 2022-02-21 NOTE — Patient Outreach (Signed)
Care Coordination ? ?02/21/2022 ? ?Cheryl Payne ?[birth year] ?[id number] ? ?RNCM returning call to patient. Ms. Nowaczyk left a message for this [facility] requesting to set up an appointment to discuss case management. RNCM had to leave a message on patient's voicemail. Message instructed patient to call the number on the back of her insurance card for [employer] and request to speak with her Case Manager, she could also reach out to her PCP and request a referral to Case Management. ? ?Lurena Joiner RN, BSN ?[place] ?RN Care Coordinator ? ?

## 2022-02-23 DIAGNOSIS — M5431 Sciatica, right side: Secondary | ICD-10-CM | POA: Diagnosis not present

## 2022-02-28 DIAGNOSIS — M5431 Sciatica, right side: Secondary | ICD-10-CM | POA: Diagnosis not present

## 2022-03-04 DIAGNOSIS — M5431 Sciatica, right side: Secondary | ICD-10-CM | POA: Diagnosis not present

## 2022-03-07 DIAGNOSIS — M5431 Sciatica, right side: Secondary | ICD-10-CM | POA: Diagnosis not present

## 2022-03-11 ENCOUNTER — Other Ambulatory Visit: Payer: Self-pay | Admitting: Family Medicine

## 2022-03-11 ENCOUNTER — Encounter: Payer: Self-pay | Admitting: Family Medicine

## 2022-03-11 ENCOUNTER — Ambulatory Visit (INDEPENDENT_AMBULATORY_CARE_PROVIDER_SITE_OTHER): Payer: Medicare Other | Admitting: Family Medicine

## 2022-03-11 VITALS — HR 79 | Ht 66.0 in | Wt 209.6 lb

## 2022-03-11 DIAGNOSIS — M5431 Sciatica, right side: Secondary | ICD-10-CM | POA: Diagnosis not present

## 2022-03-11 DIAGNOSIS — G8929 Other chronic pain: Secondary | ICD-10-CM

## 2022-03-11 DIAGNOSIS — J432 Centrilobular emphysema: Secondary | ICD-10-CM

## 2022-03-11 DIAGNOSIS — M25512 Pain in left shoulder: Secondary | ICD-10-CM

## 2022-03-11 DIAGNOSIS — C3491 Malignant neoplasm of unspecified part of right bronchus or lung: Secondary | ICD-10-CM

## 2022-03-11 MED ORDER — METHYLPREDNISOLONE ACETATE 40 MG/ML IJ SUSP
40.0000 mg | Freq: Once | INTRAMUSCULAR | Status: AC
  Administered 2022-03-11: 40 mg via INTRA_ARTICULAR

## 2022-03-11 MED ORDER — LIDOCAINE HCL (PF) 1 % IJ SOLN
4.0000 mL | Freq: Once | INTRAMUSCULAR | Status: AC
  Administered 2022-03-11: 4 mL

## 2022-03-11 NOTE — Patient Instructions (Addendum)
Thank you for coming to the office today. ? ?You received a Left Shoulder Joint steroid injection today. ?- Lidocaine numbing medicine may ease the pain initially for a few hours until it wears off ?- As discussed, you may experience a "steroid flare" this evening or within 24-48 hours, anytime medicine is injected into an inflamed joint it can cause the pain to get worse temporarily ?- Everyone responds differently to these injections, it depends on the patient and the severity of the joint problem, it may provide anywhere from days to weeks, to months of relief. Ideal response is >6 months relief ?- Try to take it easy for next 1-2 days, avoid over activity and strain on joint (limit lifting for shoulder) ?- Recommend the following: ?  - For swelling - rest, compression sleeve / ACE wrap, elevation, and ice packs as needed for first few days ?  - For pain in future may use heating pad or moist heat as needed ? ?Okay to work with Physical Therapy ? ?Please schedule a Follow-up Appointment to: Return if symptoms worsen or fail to improve. ? ?If you have any other questions or concerns, please feel free to call the office or send a message through [place]. You may also schedule an earlier appointment if necessary. ? ?Additionally, you may be receiving a survey about your experience at our office within a few days to 1 week by e-mail or mail. We value your feedback. ? ?Nobie Putnam, DO ?[place] ?

## 2022-03-11 NOTE — Progress Notes (Signed)
? ?Subjective:  ? ? Patient ID: Cheryl Payne, female    DOB: 03/13/1966, 56 y.o.   MRN: [id number] ? ?Cheryl Payne is a 56 y.o. female presenting on 03/11/2022 for Shoulder Pain ? ? ?HPI ? ?Left Shoulder Chronic Pain Bursitis ?Previously treated in 07/2020 and 2022, has improved in past 1 year with steroid injection with good results. ?She has tried other medications muscle relaxants. Limited improve ?Working with PT but not for shoulder yet. ?Request repeat injection now 1 year later ?Denies injury fall numbness tingling ? ? ? ?  12/23/2021  ? 10:17 AM 09/21/2021  ? 10:42 AM 08/24/2021  ?  8:03 AM  ?Depression screen PHQ 2/9  ?Decreased Interest 0 1 1  ?Down, Depressed, Hopeless 1 0 0  ?PHQ - 2 Score 1 1 1   ?Altered sleeping 1 2 2   ?Tired, decreased energy 1 1 1   ?Change in appetite 1 0 1  ?Feeling bad or failure about yourself  0 0 0  ?Trouble concentrating 0 0 0  ?Moving slowly or fidgety/restless 0 0 0  ?Suicidal thoughts 0 0 0  ?PHQ-9 Score 4 4 5   ?Difficult doing work/chores Not difficult at all Not difficult at all Not difficult at all  ? ? ?Social History  ? ?Tobacco Use  ? Smoking status: Former  ?  Packs/day: 1.00  ?  Years: 35.00  ?  Pack years: 35.00  ?  Types: Cigarettes  ? Smokeless tobacco: Former  ?  Quit date: 06/04/2018  ?Substance Use Topics  ? Alcohol use: Never  ? Drug use: Never  ? ? ?Review of Systems ?Per HPI unless specifically indicated above ? ?   ?Objective:  ?  ?Pulse 79   Ht 5\' 6"  (1.676 m)   Wt 209 lb 9.6 oz (95.1 kg)   SpO2 99%   BMI 33.83 kg/m?   ?Wt Readings from Last 3 Encounters:  ?03/11/22 209 lb 9.6 oz (95.1 kg)  ?12/23/21 210 lb 3.2 oz (95.3 kg)  ?09/21/21 212 lb 9.6 oz (96.4 kg)  ?  ?Physical Exam ?Vitals and nursing note reviewed.  ?Constitutional:   ?   General: She is not in acute distress. ?   Appearance: Normal appearance. She is well-developed. She is not diaphoretic.  ?   Comments: Well-appearing, comfortable, cooperative  ?HENT:  ?   Head: Normocephalic and  atraumatic.  ?Eyes:  ?   General:     ?   Right eye: No discharge.     ?   Left eye: No discharge.  ?   Conjunctiva/sclera: Conjunctivae normal.  ?Cardiovascular:  ?   Rate and Rhythm: Normal rate.  ?Pulmonary:  ?   Effort: Pulmonary effort is normal.  ?Musculoskeletal:  ?   Comments: Left Shoulder pain with abduction flexion range of motion above head, no rotator cuff weakness.  ?Skin: ?   General: Skin is warm and dry.  ?   Findings: No erythema or rash.  ?Neurological:  ?   Mental Status: She is alert and oriented to person, place, and time.  ?Psychiatric:     ?   Mood and Affect: Mood normal.     ?   Behavior: Behavior normal.     ?   Thought Content: Thought content normal.  ?   Comments: Well groomed, good eye contact, normal speech and thoughts  ? ? ?________________________________________________________ ?PROCEDURE NOTE ?Date: 03/11/22 ?Left Shoulder injection ?Discussed benefits and risks (including pain, bleeding, infection, steroid flare). ?Verbal consent given by patient. ?  Medication:  1 cc Depo-medrol 40mg  and 4 cc Lidocaine 1% without epi ?Time Out taken  ?Landmarks identified. Area cleansed with alcohol wipes. Using 21 gauge and 1, 1/2 inch needle, Left subacromial bursa space was injected (with above listed medication) via posterior approach cold spray used for superficial anesthetic. Sterile bandage placed. Patient tolerated procedure well without bleeding or paresthesias. No complications. ? ? ? ?Results for orders placed or performed in visit on 12/23/21  ?Hepatic Function Panel  ?Result Value Ref Range  ? Total Protein 7.2 6.0 - 8.5 g/dL  ? Albumin 4.4 3.8 - 4.9 g/dL  ? Bilirubin Total 0.3 0.0 - 1.2 mg/dL  ? Bilirubin, Direct <0.10 0.00 - 0.40 mg/dL  ? Alkaline Phosphatase 122 (H) 44 - 121 IU/L  ? AST 20 0 - 40 IU/L  ? ALT 13 0 - 32 IU/L  ? ?   ?Assessment & Plan:  ? ?Problem List Items Addressed This Visit   ? ? Chronic left shoulder pain - Primary  ?  ?Consistent with subacute on chronic  L-shoulder bursitis vs rotator cuff tendinopathy with some reduced active ROM but without significant evidence of muscle tear (no weakness).  ? ?Improved w injection prior 02/2021 ?Today repeat dose #2 ?  ?Plan: ?Left shoulder subacromial steroid injection performed today, see procedure note for details. ? ?1. Gabapentin PRN ?2. May take Tylenol Ex Str 1-2 q 6 hr PRN ?3. Topical Diclofenac PRN ?4. Relative rest but keep shoulder mobile, demonstrated ROM exercises, avoid heavy lifting ?5. May try heating pad PRN ? ?Pursue PHysical therapy add on Shoulder exercises already has setup. ? ?Meds ordered this encounter  ?Medications  ? lidocaine (PF) (XYLOCAINE) 1 % injection 4 mL  ? methylPREDNISolone acetate (DEPO-MEDROL) injection 40 mg  ? ? ? ? ?Follow up plan: ?Return if symptoms worsen or fail to improve. ? ? ?Nobie Putnam, DO ?[facility] ?[facility] ?03/11/2022, 3:53 PM ?

## 2022-03-14 ENCOUNTER — Ambulatory Visit: Payer: Medicare Other | Admitting: Family Medicine

## 2022-03-14 DIAGNOSIS — M5431 Sciatica, right side: Secondary | ICD-10-CM | POA: Diagnosis not present

## 2022-03-16 ENCOUNTER — Telehealth: Payer: Self-pay

## 2022-03-16 DIAGNOSIS — M5431 Sciatica, right side: Secondary | ICD-10-CM | POA: Diagnosis not present

## 2022-03-16 NOTE — Chronic Care Management (AMB) (Signed)
?  Care Management  ? ?Note ? ?03/16/2022 ?Name: [name] MRN: [id number] DOB: 09-29-1966 ? ?Cheryl Payne is a 56 y.o. year old female who is a primary care patient of [name], DO. I reached out to [employer] by phone today offer care coordination services.  ? ?Cheryl Payne was given information about care management services today including:  ?Care management services include personalized support from designated clinical staff supervised by her physician, including individualized plan of care and coordination with other care providers ?24/7 contact phone numbers for assistance for urgent and routine care needs. ?The patient may stop care management services at any time by phone call to the office staff. ? ?Patient agreed to services and verbal consent obtained.  ? ?Follow up plan: ?Telephone appointment with care management team member scheduled for:03/31/2022 ? ?Noreene Larsson, RMA ?Care Guide, Embedded Care Coordination ?[facility]  ?[place], [place] [address] ?Direct Dial: [phone] ?[email or web address] ?Website: [email or web address]  ? ?

## 2022-03-19 ENCOUNTER — Other Ambulatory Visit: Payer: Self-pay | Admitting: Podiatry

## 2022-03-21 ENCOUNTER — Other Ambulatory Visit: Payer: Self-pay | Admitting: Family Medicine

## 2022-03-21 DIAGNOSIS — I10 Essential (primary) hypertension: Secondary | ICD-10-CM

## 2022-03-22 NOTE — Telephone Encounter (Signed)
Requested Prescriptions  ?Pending Prescriptions Disp Refills  ?? metoprolol tartrate (LOPRESSOR) 25 MG tablet [Pharmacy Med Name: METOPROLOL TARTRATE 25MG  TABLETS] 180 tablet 0  ?  Sig: TAKE 1 TABLET(25 MG) BY MOUTH TWICE DAILY  ?  ? Cardiovascular:  Beta Blockers Passed - 03/21/2022  4:49 PM  ?  ?  Passed - Last BP in normal range  ?  BP Readings from Last 1 Encounters:  ?12/23/21 124/75  ?   ?  ?  Passed - Last Heart Rate in normal range  ?  Pulse Readings from Last 1 Encounters:  ?03/11/22 79  ?   ?  ?  Passed - Valid encounter within last 6 months  ?  Recent Outpatient Visits   ?      ? 1 week ago Chronic left shoulder pain  ? [place], DO  ? 2 months ago Vaginal discharge  ? [place], DO  ? 6 months ago Chronic left-sided low back pain with left-sided sciatica  ? [place], DO  ? 7 months ago Annual physical exam  ? [place], DO  ? 7 months ago Chronic left-sided low back pain with left-sided sciatica  ? [facility] [place], [place], NP  ?  ?  ? ?  ?  ?  ? ?

## 2022-03-23 DIAGNOSIS — M5431 Sciatica, right side: Secondary | ICD-10-CM | POA: Diagnosis not present

## 2022-03-25 DIAGNOSIS — M5431 Sciatica, right side: Secondary | ICD-10-CM | POA: Diagnosis not present

## 2022-03-28 DIAGNOSIS — M5431 Sciatica, right side: Secondary | ICD-10-CM | POA: Diagnosis not present

## 2022-03-31 ENCOUNTER — Ambulatory Visit: Payer: Self-pay

## 2022-03-31 ENCOUNTER — Telehealth: Payer: Medicare Other

## 2022-03-31 DIAGNOSIS — G8929 Other chronic pain: Secondary | ICD-10-CM

## 2022-03-31 DIAGNOSIS — F3341 Major depressive disorder, recurrent, in partial remission: Secondary | ICD-10-CM

## 2022-03-31 DIAGNOSIS — I10 Essential (primary) hypertension: Secondary | ICD-10-CM

## 2022-03-31 DIAGNOSIS — J432 Centrilobular emphysema: Secondary | ICD-10-CM

## 2022-03-31 DIAGNOSIS — E7800 Pure hypercholesterolemia, unspecified: Secondary | ICD-10-CM

## 2022-03-31 DIAGNOSIS — F411 Generalized anxiety disorder: Secondary | ICD-10-CM

## 2022-03-31 NOTE — Patient Instructions (Signed)
Visit Information ? ?Thank you for taking time to visit with me today. Please don't hesitate to contact me if I can be of assistance to you before our next scheduled telephone appointment. ? ?Following are the goals we discussed today:  ? ? ?Our next appointment is by telephone on 05-05-2022 at 0900 am  ? ?Please call the care guide team at [phone] if you need to cancel or reschedule your appointment.  ? ?If you are experiencing a Mental Health or [place] or need someone to talk to, please call the Suicide and Crisis Lifeline: 988 ?call the [place] National Suicide Prevention Lifeline: 928 439 7817 or TTY: 732-775-0743 TTY 863-062-1053) to talk to a trained counselor ?call 1-800-273-TALK (toll free, 24 hour hotline)  ? ?Following is a copy of your full plan of care:  ?Care Plan : RNCM: General Plan of Care for Chronic Disease Management and Care Coordination Needs  ?Updates made by Vanita Ingles, RN since 03/31/2022 12:00 AM  ?  ? ?Problem: RNCM: Development of Plan of Care for Chronic Disease Management (HTN, HLD, Depression, Anxiety, COPD,  Pain)   ?Priority: High  ?  ? ?Long-Range Goal: RNCM: Effective Management of Plan of Care for Chronic Disease Management (HTN, HLD, Depression, Anxiety, Pain, COPD   ?Start Date: 03/31/2022  ?Expected End Date: 04/01/2023  ?Priority: High  ?Note:   ?Current Barriers:  ?Knowledge Deficits related to plan of care for management of HTN, HLD, COPD, Chronic Pain, and Depression, anxiety  ?Care Coordination needs related to Mental Health Concerns   ?Chronic Disease Management support and education needs related to HTN, HLD, COPD, Chronic Pain, and Depression and anxiety ? ?RNCM Clinical Goal(s):  ?Patient will verbalize basic understanding of HTN, HLD, COPD, Anxiety, Depression, and Chronic pain disease process and self health management plan as evidenced by keeping appointments, following the plan of care and working with the CCM team to effectively manage health  and well being ?take all medications exactly as prescribed and will call provider for medication related questions as evidenced by compliance with medications and calling for refills before running out of medications    ?attend all scheduled medical appointments: with pcp and specialist as evidenced by keeping appointments and calling for schedule change needs        ?demonstrate improved and ongoing adherence to prescribed treatment plan for HTN, HLD, COPD, Anxiety, Depression, and Chronic pain  as evidenced by stable conditions, stable VS, stable lab work, no acute exacerbations of conditions ?work with pharmacist to address Medication procurement related to HTN, HLD, COPD, Anxiety, Depression, and Chronic pain  as evidenced by review of EMR and patient or pharmacist report    ?demonstrate ongoing self health care management ability for effective management of chronic conditions as evidenced by working with the CCM team through collaboration with Consulting civil engineer, provider, and care team.  ? ?Interventions: ?1:1 collaboration with primary care provider regarding development and update of comprehensive plan of care as evidenced by provider attestation and co-signature ?Inter-disciplinary care team collaboration (see longitudinal plan of care) ?Evaluation of current treatment plan related to  self management and patient's adherence to plan as established by provider ? ? ?COPD: (Status: New goal. Goal on Track (progressing): YES.) Long Term Goal  ?Reviewed medications with patient, including use of prescribed maintenance and rescue inhalers, and provided instruction on medication management and the importance of adherence ?Provided patient with basic written and verbal COPD education on self care/management/and exacerbation prevention ?Advised patient to track and  manage COPD triggers ?Provided written and verbal instructions on pursed lip breathing and utilized returned demonstration as teach back ?Provided  instruction about proper use of medications used for management of COPD including inhalers ?Advised patient to self assesses COPD action plan zone and make appointment with provider if in the yellow zone for 48 hours without improvement ?Advised patient to engage in light exercise as tolerated 3-5 days a week to aid in the the management of COPD ?Provided education about and advised patient to utilize infection prevention strategies to reduce risk of respiratory infection ?Discussed the importance of adequate rest and management of fatigue with COPD ? ?Depression and Anxiety  (Status: New goal. Goal on Track (progressing): YES.) Long Term Goal  ?Evaluation of current treatment plan related to Anxiety and Depression, Mental Health Concerns  self-management and patient's adherence to plan as established by provider. ?Discussed plans with patient for ongoing care management follow up and provided patient with direct contact information for care management team ?Advised patient to call the office for changes in mood, anxiety, depression, and changes in mental health; ?Provided education to patient re: on the benefits of the LCSW and availability, also the support of the CCM team for meeting health and wellness needs; ?Reviewed medications with patient and discussed compliance. The patient wants to have her medications all delivered at the same time. Will illicite the help of the pharm D for assistance with medications; ?Provided patient with depression and anxiety educational materials related to stress relief and being mindful of ways to reduce stress and anxiety; ?Discussed plans with patient for ongoing care management follow up and provided patient with direct contact information for care management team; ?Advised patient to discuss changes in mood, anxiety, depression, and mental health concerns  with provider; ?Screening for signs and symptoms of depression related to chronic disease state;  ?Assessed social  determinant of health barriers;  ? ?Hyperlipidemia:  (Status: New goal. Goal on Track (progressing): YES.) Long Term Goal  ?Lab Results  ?Component Value Date  ? CHOL 203 (H) 08/17/2021  ? HDL 56 08/17/2021  ? LDLCALC 120 (H) 08/17/2021  ? TRIG 157 (H) 08/17/2021  ? CHOLHDL 3.6 08/17/2021  ?  ? ?Medication review performed; medication list updated in electronic medical record.  ?Provider established cholesterol goals reviewed; ?Counseled on importance of regular laboratory monitoring as prescribed; ?Provided HLD educational materials; ?Reviewed role and benefits of statin for ASCVD risk reduction; ?Discussed strategies to manage statin-induced myalgias; ?Reviewed importance of limiting foods high in cholesterol; ?Reviewed exercise goals and target of 150 minutes per week; ?Screening for signs and symptoms of depression related to chronic disease state;  ?Assessed social determinant of health barriers;  ? ?Hypertension: (Status: New goal. Goal on Track (progressing): YES.) Long Term Goal  ?Last practice recorded BP readings:  ?BP Readings from Last 3 Encounters:  ?12/23/21 124/75  ?09/21/21 140/84  ?08/24/21 132/67  ?Most recent eGFR/CrCl:  ?Lab Results  ?Component Value Date  ? EGFR 89 08/17/2021  ?  No components found for: CRCL ? ?Evaluation of current treatment plan related to hypertension self management and patient's adherence to plan as established by provider;   ?Provided education to patient re: stroke prevention, s/s of heart attack and stroke; ?Reviewed prescribed diet heart healthy diet ?Reviewed medications with patient and discussed importance of compliance;  ?Counseled on the importance of exercise goals with target of 150 minutes per week ?Discussed plans with patient for ongoing care management follow up and provided patient with direct  contact information for care management team; ?Advised patient, providing education and rationale, to monitor blood pressure daily and record, calling PCP for  findings outside established parameters;  ?Advised patient to discuss HTN and other concerns with heart health with provider; ?Provided education on prescribed diet heart healthy;  ?Discussed complications of poorly contr

## 2022-03-31 NOTE — Chronic Care Management (AMB) (Signed)
Care Management    RN Visit Note  03/31/2022 Name: Cheryl Payne MRN: [id number] DOB: 1966/12/05  Subjective: Cheryl Payne is a 56 y.o. year old female who is a primary care patient of [name], DO. The care management team was consulted for assistance with disease management and care coordination needs.    Engaged with patient by telephone for follow up visit in response to provider referral for case management and/or care coordination services.   Consent to Services:   [name] was given information about Care Management services today including:  Care Management services includes personalized support from designated clinical staff supervised by her physician, including individualized plan of care and coordination with other care providers 24/7 contact phone numbers for assistance for urgent and routine care needs. The patient may stop case management services at any time by phone call to the office staff.  Patient agreed to services and consent obtained.   Assessment: Review of patient past medical history, allergies, medications, health status, including review of consultants reports, laboratory and other test data, was performed as part of comprehensive evaluation and provision of chronic care management services.   SDOH (Social Determinants of Health) assessments and interventions performed:  SDOH Interventions    Flowsheet Row Most Recent Value  SDOH Interventions   Food Insecurity Interventions Intervention Not Indicated  Financial Strain Interventions Intervention Not Indicated  Housing Interventions Intervention Not Indicated  Intimate Partner Violence Interventions Intervention Not Indicated  Physical Activity Interventions Intervention Not Indicated  Stress Interventions Intervention Not Indicated  Social Connections Interventions Intervention Not Indicated  Transportation Interventions Intervention Not Indicated        Care Plan  No Known  Allergies  Outpatient Encounter Medications as of 03/31/2022  Medication Sig   amLODipine (NORVASC) 5 MG tablet TAKE 1 TABLET(5 MG) BY MOUTH DAILY   aspirin EC 81 MG tablet Take 81 mg by mouth daily. Swallow whole.   escitalopram (LEXAPRO) 10 MG tablet TAKE 1 TABLET(10 MG) BY MOUTH DAILY. START ONCE FINISHED CITALOPRAM 10 MG DOSAGE TAPER OFF   gabapentin (NEURONTIN) 300 MG capsule TAKE 1 CAPSULE BY MOUTH AT BEDTIME MAY INCREASE TO 2 CAPSULES AT BEDTIME AS TOLERATED. ALSO MAY TAKE 1 CAPSULE DURNING DAY AS NEEDED   meloxicam (MOBIC) 15 MG tablet TAKE 1 TABLET(15 MG) BY MOUTH DAILY AS NEEDED FOR PAIN   metoprolol tartrate (LOPRESSOR) 25 MG tablet TAKE 1 TABLET(25 MG) BY MOUTH TWICE DAILY   rosuvastatin (CRESTOR) 10 MG tablet Take 1 tablet (10 mg total) by mouth daily.   terbinafine (LAMISIL) 250 MG tablet Take 1 tablet (250 mg total) by mouth daily.   No facility-administered encounter medications on file as of 03/31/2022.    Patient Active Problem List   Diagnosis Date Noted   Chronic left-sided low back pain with left-sided sciatica 09/21/2021   Pure hypercholesterolemia 07/13/2020   Major depressive disorder, recurrent, in partial remission (HCC) 07/13/2020   GAD (generalized anxiety disorder) 07/13/2020   Encounter for screening mammogram for malignant neoplasm of breast 07/13/2020   Muscle spasm of left shoulder 07/13/2020   Chronic left shoulder pain 07/13/2020   Centrilobular emphysema (HCC) 07/13/2020   Moderate aortic valve insufficiency 07/13/2020   Morbid obesity (HCC) 07/13/2020   Non-small cell carcinoma of lung (HCC) 01/02/2019   Essential hypertension 09/02/2014    Conditions to be addressed/monitored: HTN, HLD, COPD, Anxiety, Depression, and Pain  Care Plan : RNCM: General Plan of Care for Chronic Disease Management and Care Coordination Needs  Updates made by Marlowe Sax, RN since 03/31/2022 12:00 AM     Problem: RNCM: Development of Plan of Care for Chronic  Disease Management (HTN, HLD, Depression, Anxiety, COPD,  Pain)   Priority: High     Long-Range Goal: RNCM: Effective Management of Plan of Care for Chronic Disease Management (HTN, HLD, Depression, Anxiety, Pain, COPD   Start Date: 03/31/2022  Expected End Date: 04/01/2023  Priority: High  Note:   Current Barriers:  Knowledge Deficits related to plan of care for management of HTN, HLD, COPD, Chronic Pain, and Depression, anxiety  Care Coordination needs related to Mental Health Concerns   Chronic Disease Management support and education needs related to HTN, HLD, COPD, Chronic Pain, and Depression and anxiety  RNCM Clinical Goal(s):  Patient will verbalize basic understanding of HTN, HLD, COPD, Anxiety, Depression, and Chronic pain disease process and self health management plan as evidenced by keeping appointments, following the plan of care and working with the CCM team to effectively manage health and well being take all medications exactly as prescribed and will call provider for medication related questions as evidenced by compliance with medications and calling for refills before running out of medications    attend all scheduled medical appointments: with pcp and specialist as evidenced by keeping appointments and calling for schedule change needs        demonstrate improved and ongoing adherence to prescribed treatment plan for HTN, HLD, COPD, Anxiety, Depression, and Chronic pain  as evidenced by stable conditions, stable VS, stable lab work, no acute exacerbations of conditions work with pharmacist to address Medication procurement related to HTN, HLD, COPD, Anxiety, Depression, and Chronic pain  as evidenced by review of EMR and patient or pharmacist report    demonstrate ongoing self health care management ability for effective management of chronic conditions as evidenced by working with the CCM team through collaboration with Medical illustrator, provider, and care team.    Interventions: 1:1 collaboration with primary care provider regarding development and update of comprehensive plan of care as evidenced by provider attestation and co-signature Inter-disciplinary care team collaboration (see longitudinal plan of care) Evaluation of current treatment plan related to  self management and patient's adherence to plan as established by provider   COPD: (Status: New goal. Goal on Track (progressing): YES.) Long Term Goal  Reviewed medications with patient, including use of prescribed maintenance and rescue inhalers, and provided instruction on medication management and the importance of adherence Provided patient with basic written and verbal COPD education on self care/management/and exacerbation prevention Advised patient to track and manage COPD triggers Provided written and verbal instructions on pursed lip breathing and utilized returned demonstration as teach back Provided instruction about proper use of medications used for management of COPD including inhalers Advised patient to self assesses COPD action plan zone and make appointment with provider if in the yellow zone for 48 hours without improvement Advised patient to engage in light exercise as tolerated 3-5 days a week to aid in the the management of COPD Provided education about and advised patient to utilize infection prevention strategies to reduce risk of respiratory infection Discussed the importance of adequate rest and management of fatigue with COPD  Depression and Anxiety  (Status: New goal. Goal on Track (progressing): YES.) Long Term Goal  Evaluation of current treatment plan related to Anxiety and Depression, Mental Health Concerns  self-management and patient's adherence to plan as established by provider. Discussed plans with patient for ongoing care management  follow up and provided patient with direct contact information for care management team Advised patient to call the office for  changes in mood, anxiety, depression, and changes in mental health; Provided education to patient re: on the benefits of the LCSW and availability, also the support of the CCM team for meeting health and wellness needs; Reviewed medications with patient and discussed compliance. The patient wants to have her medications all delivered at the same time. Will illicite the help of the pharm D for assistance with medications; Provided patient with depression and anxiety educational materials related to stress relief and being mindful of ways to reduce stress and anxiety; Discussed plans with patient for ongoing care management follow up and provided patient with direct contact information for care management team; Advised patient to discuss changes in mood, anxiety, depression, and mental health concerns  with provider; Screening for signs and symptoms of depression related to chronic disease state;  Assessed social determinant of health barriers;   Hyperlipidemia:  (Status: New goal. Goal on Track (progressing): YES.) Long Term Goal  Lab Results  Component Value Date   CHOL 203 (H) 08/17/2021   HDL 56 08/17/2021   LDLCALC 120 (H) 08/17/2021   TRIG 157 (H) 08/17/2021   CHOLHDL 3.6 08/17/2021     Medication review performed; medication list updated in electronic medical record.  Provider established cholesterol goals reviewed; Counseled on importance of regular laboratory monitoring as prescribed; Provided HLD educational materials; Reviewed role and benefits of statin for ASCVD risk reduction; Discussed strategies to manage statin-induced myalgias; Reviewed importance of limiting foods high in cholesterol; Reviewed exercise goals and target of 150 minutes per week; Screening for signs and symptoms of depression related to chronic disease state;  Assessed social determinant of health barriers;   Hypertension: (Status: New goal. Goal on Track (progressing): YES.) Long Term Goal  Last practice  recorded BP readings:  BP Readings from Last 3 Encounters:  12/23/21 124/75  09/21/21 140/84  08/24/21 132/67  Most recent eGFR/CrCl:  Lab Results  Component Value Date   EGFR 89 08/17/2021    No components found for: CRCL  Evaluation of current treatment plan related to hypertension self management and patient's adherence to plan as established by provider;   Provided education to patient re: stroke prevention, s/s of heart attack and stroke; Reviewed prescribed diet heart healthy diet Reviewed medications with patient and discussed importance of compliance;  Counseled on the importance of exercise goals with target of 150 minutes per week Discussed plans with patient for ongoing care management follow up and provided patient with direct contact information for care management team; Advised patient, providing education and rationale, to monitor blood pressure daily and record, calling PCP for findings outside established parameters;  Advised patient to discuss HTN and other concerns with heart health with provider; Provided education on prescribed diet heart healthy;  Discussed complications of poorly controlled blood pressure such as heart disease, stroke, circulatory complications, vision complications, kidney impairment, sexual dysfunction;   Pain:  (Status: New goal. Goal on Track (progressing): YES.) Long Term Goal  Pain assessment performed Medications reviewed Reviewed provider established plan for pain management; Discussed importance of adherence to all scheduled medical appointments; Counseled on the importance of reporting any/all new or changed pain symptoms or management strategies to pain management provider; Advised patient to report to care team affect of pain on daily activities; Discussed use of relaxation techniques and/or diversional activities to assist with pain reduction (distraction, imagery, relaxation, massage, acupressure, TENS,  heat, and cold  application; Reviewed with patient prescribed pharmacological and nonpharmacological pain relief strategies; Advised patient to discuss call the provider for changes in pain, unresolved pain  with provider;  Patient Goals/Self-Care Activities: Take medications as prescribed   Attend all scheduled provider appointments Call pharmacy for medication refills 3-7 days in advance of running out of medications Attend church or other social activities Perform all self care activities independently  Perform IADL's (shopping, preparing meals, housekeeping, managing finances) independently Call provider office for new concerns or questions  Work with the social worker to address care coordination needs and will continue to work with the clinical team to address health care and disease management related needs call the Suicide and Crisis Lifeline: 988 call the [place] National Suicide Prevention Lifeline: 779-830-8610 or TTY: 514-016-7452 TTY (914)847-2328) to talk to a trained counselor call 1-800-273-TALK (toll free, 24 hour hotline) if experiencing a Mental Health or Behavioral Health Crisis  check blood pressure weekly choose a place to take my blood pressure (home, clinic or office, retail store) write blood pressure results in a log or diary learn about high blood pressure keep a blood pressure log take blood pressure log to all doctor appointments call doctor for signs and symptoms of high blood pressure develop an action plan for high blood pressure keep all doctor appointments take medications for blood pressure exactly as prescribed report new symptoms to your doctor eat more whole grains, fruits and vegetables, lean meats and healthy fats - call for medicine refill 2 or 3 days before it runs out - take all medications exactly as prescribed - call doctor with any symptoms you believe are related to your medicine - call doctor when you experience any new symptoms - go to all doctor  appointments as scheduled - adhere to prescribed diet: heart Healthy       Plan: Telephone follow up appointment with care management team member scheduled for:  05-05-2022 at 0900 am  Alto Denver RN, MSN, CCM Community Care Coordinator [facility]  [facility] [place] Mobile: [phone]

## 2022-04-01 ENCOUNTER — Telehealth: Payer: Self-pay

## 2022-04-01 NOTE — Chronic Care Management (AMB) (Signed)
?  Chronic Care Management  ? ?Note ? ?04/01/2022 ?Name: Cheryl Payne MRN: [id number] DOB: Nov 26, 1966 ? ?Cheryl Payne is a 56 y.o. year old female who is a primary care patient of [place], Devonne Doughty, DO. [name] is currently enrolled in care management services. An additional referral for Pharm D  was placed.  ? ?Follow up plan: ?Unsuccessful telephone outreach attempt made. A HIPAA compliant phone message was left for the patient providing contact information and requesting a return call.  ?The care management team will reach out to the patient again over the next 7 days.  ?If patient returns call to provider office, please advise to call [place]  at [phone] ? ?Noreene Larsson, RMA ?Care Guide, Embedded Care Coordination ?[facility]  Care Management  ?[place], [place] [address] ?Direct Dial: [phone] ?[email or web address] ?Website: [email or web address]  ? ?

## 2022-04-08 DIAGNOSIS — M5431 Sciatica, right side: Secondary | ICD-10-CM | POA: Diagnosis not present

## 2022-04-13 NOTE — Chronic Care Management (AMB) (Signed)
?  Chronic Care Management  ? ?Note ? ?04/13/2022 ?Name: Aleicia Kenagy MRN: [id number] DOB: Oct 20, 1966 ? ?Reverie Vaquera is a 56 y.o. year old female who is a primary care patient of [place], Devonne Doughty, DO. Aditri Louischarles is currently enrolled in care management services. An additional referral for Pharm D  was placed.  ? ?Follow up plan: ?Unsuccessful telephone outreach attempt made. A HIPAA compliant phone message was left for the patient providing contact information and requesting a return call.  ?The care management team will reach out to the patient again over the next 7 days.  ?If patient returns call to provider office, please advise to call [place]  at [phone] ? ?Noreene Larsson, RMA ?Care Guide, Embedded Care Coordination ?[facility]  Care Management  ?[place], [place] [address] ?Direct Dial: [phone] ?[email or web address] ?Website: [email or web address]  ? ?

## 2022-04-13 NOTE — Chronic Care Management (AMB) (Signed)
?  Chronic Care Management  ? ?Note ? ?04/13/2022 ?Name: Saramarie Stinger MRN: [id number] DOB: 11-15-1966 ? ?Jerusalen Mateja is a 57 y.o. year old female who is a primary care patient of [place], Devonne Doughty, DO. Arlene Brickel is currently enrolled in care management services. An additional referral for Pharm D  was placed.  ? ?Follow up plan: ?Telephone appointment with care management team member scheduled for:04/20/2022 ? ?Noreene Larsson, RMA ?Care Guide, Embedded Care Coordination ?[facility]  Care Management  ?[place], [place] [address] ?Direct Dial: [phone] ?[email or web address] ?Website: [email or web address]  ? ?

## 2022-04-18 ENCOUNTER — Other Ambulatory Visit: Payer: Self-pay | Admitting: Family Medicine

## 2022-04-18 DIAGNOSIS — G8929 Other chronic pain: Secondary | ICD-10-CM

## 2022-04-19 NOTE — Telephone Encounter (Signed)
Requested Prescriptions  ?Pending Prescriptions Disp Refills  ?? gabapentin (NEURONTIN) 300 MG capsule [Pharmacy Med Name: GABAPENTIN 300MG  CAPSULES] 90 capsule 2  ?  Sig: TAKE 1 CAPSULE BY MOUTH AT BEDTIME. MAY INCREASE TO 2 CAPSULES AT BEDTIME AS TOLERATED. ALSO. MAY TAKE 1 CAPSULE DURNING DAY AS NEEDED  ?  ? Neurology: Anticonvulsants - gabapentin Passed - 04/18/2022 11:33 AM  ?  ?  Passed - Cr in normal range and within 360 days  ?  Creat  ?Date Value Ref Range Status  ?08/17/2021 0.78 0.50 - 1.03 mg/dL Final  ?   ?  ?  Passed - Completed PHQ-2 or PHQ-9 in the last 360 days  ?  ?  Passed - Valid encounter within last 12 months  ?  Recent Outpatient Visits   ?      ? 1 month ago Chronic left shoulder pain  ? [place], DO  ? 3 months ago Vaginal discharge  ? [place], DO  ? 7 months ago Chronic left-sided low back pain with left-sided sciatica  ? [place], DO  ? 7 months ago Annual physical exam  ? [place], DO  ? 8 months ago Chronic left-sided low back pain with left-sided sciatica  ? [facility] [place], Coralie Keens, NP  ?  ?  ? ?  ?  ?  ? ?

## 2022-04-20 ENCOUNTER — Telehealth: Payer: Self-pay | Admitting: Pharmacist

## 2022-04-20 ENCOUNTER — Telehealth: Payer: Medicare Other

## 2022-04-20 NOTE — Telephone Encounter (Signed)
?  Chronic Care Management  ? ?Outreach Note ? ?04/20/2022 ?Name: Cheryl Payne MRN: [id number] DOB: 1966-04-17 ? ?Referred by: [name], DO ?Reason for referral : No chief complaint on file. ? ?Was unable to reach patient via telephone today and have left HIPAA compliant voicemail asking patient to return my call. ? ? ?Follow Up Plan: Will collaborate with Care Guide to outreach to schedule follow up with me ? ?Wallace Cullens, PharmD, BCACP ?Clinical Pharmacist ?[facility] Management ?[phone] ? ?

## 2022-04-21 ENCOUNTER — Telehealth: Payer: Self-pay

## 2022-04-21 NOTE — Chronic Care Management (AMB) (Signed)
  Care Coordination Note  04/21/2022 Name: Cheryl Payne MRN: [id number] DOB: 22-Jul-1966  Cheryl Payne is a 56 y.o. year old female who is a primary care patient of [name], DO and is actively engaged with the care management team. I reached out to Cheryl Payne by phone today to assist with re-scheduling a follow up visit with the Pharmacist  Follow up plan: Unsuccessful telephone outreach attempt made. A HIPAA compliant phone message was left for the patient providing contact information and requesting a return call.  The care management team will reach out to the patient again over the next 7 days.  If patient returns call to provider office, please advise to call [facility]  at [place], [place], [place], [place] [address] Direct Dial: [phone] [email or web address] Website: [email or web address]

## 2022-04-26 ENCOUNTER — Ambulatory Visit (INDEPENDENT_AMBULATORY_CARE_PROVIDER_SITE_OTHER): Payer: Medicaid Other | Admitting: Podiatry

## 2022-04-26 DIAGNOSIS — B351 Tinea unguium: Secondary | ICD-10-CM

## 2022-04-26 DIAGNOSIS — Z79899 Other long term (current) drug therapy: Secondary | ICD-10-CM | POA: Diagnosis not present

## 2022-04-26 NOTE — Progress Notes (Signed)
Subjective:  Patient ID: Cheryl Payne, female    DOB: 11/01/1966,  MRN: [id number]  Chief Complaint  Patient presents with   Nail Problem    Nail fungus follow up     56 y.o. female presents with the above complaint.  Patient presents with a follow-up of bilateral hallux onychomycosis.  Patient has completed 3 months of Lamisil.  She states it has improved some is not completely eradicated.  She wanted get it evaluated she has not had any problems taking the medication.  She denies any other acute complaints.   Review of Systems: Negative except as noted in the HPI. Denies N/V/F/Ch.  Past Medical History:  Diagnosis Date   Cancer ([place])    lung   Chronic left shoulder pain     Current Outpatient Medications:    amLODipine (NORVASC) 5 MG tablet, TAKE 1 TABLET(5 MG) BY MOUTH DAILY, Disp: 90 tablet, Rfl: 3   aspirin EC 81 MG tablet, Take 81 mg by mouth daily. Swallow whole., Disp: , Rfl:    escitalopram (LEXAPRO) 10 MG tablet, TAKE 1 TABLET(10 MG) BY MOUTH DAILY. START ONCE FINISHED CITALOPRAM 10 MG DOSAGE TAPER OFF, Disp: 30 tablet, Rfl: 5   gabapentin (NEURONTIN) 300 MG capsule, TAKE 1 CAPSULE BY MOUTH AT BEDTIME. MAY INCREASE TO 2 CAPSULES AT BEDTIME AS TOLERATED. ALSO. MAY TAKE 1 CAPSULE DURNING DAY AS NEEDED, Disp: 90 capsule, Rfl: 2   meloxicam (MOBIC) 15 MG tablet, TAKE 1 TABLET(15 MG) BY MOUTH DAILY AS NEEDED FOR PAIN, Disp: 30 tablet, Rfl: 2   metoprolol tartrate (LOPRESSOR) 25 MG tablet, TAKE 1 TABLET(25 MG) BY MOUTH TWICE DAILY, Disp: 180 tablet, Rfl: 0   rosuvastatin (CRESTOR) 10 MG tablet, Take 1 tablet (10 mg total) by mouth daily., Disp: 90 tablet, Rfl: 3   terbinafine (LAMISIL) 250 MG tablet, Take 1 tablet (250 mg total) by mouth daily., Disp: 90 tablet, Rfl: 0  Social History   Tobacco Use  Smoking Status Former   Packs/day: 1.00   Years: 35.00   Pack years: 35.00   Types: Cigarettes  Smokeless Tobacco Former   Quit date: 06/04/2018    No Known  Allergies Objective:  There were no vitals filed for this visit. There is no height or weight on file to calculate BMI. Constitutional Well developed. Well nourished.  Vascular Dorsalis pedis pulses palpable bilaterally. Posterior tibial pulses palpable bilaterally. Capillary refill normal to all digits.  No cyanosis or clubbing noted. Pedal hair growth normal.  Neurologic Normal speech. Oriented to person, place, and time. Epicritic sensation to light touch grossly present bilaterally.  Dermatologic Nails thickened elongated dystrophic toenails x2 bilateral hallux.  Mild pain on palpation which is improving. Skin within normal limits  Orthopedic: Normal joint ROM without pain or crepitus bilaterally. No visible deformities. No bony tenderness.   Radiographs: None Assessment:   1. Long-term use of high-risk medication   2. Nail fungus   3. Onychomycosis due to dermatophyte    Plan:  Patient was evaluated and treated and all questions answered.  Bilateral hallux onychomycosis~improving and second round -Educated the patient on the etiology of onychomycosis and various treatment options associated with improving the fungal load.  I explained to the patient that there is 3 treatment options available to treat the onychomycosis including topical, p.o., laser treatment.  Patient elected to undergo second round of p.o. options with Lamisil/terbinafine therapy.  In order for me to start the medication therapy, I explained to the patient the importance of evaluating  the liver and obtaining another the liver function test.  Once the liver function test comes back normal I will start her on second on 88-month course of Lamisil therapy.  Patient understood all risk and would like to proceed with Lamisil therapy.  I have asked the patient to immediately stop the Lamisil therapy if she has any reactions to it and call the office or go to the emergency room right away.  Patient states  understanding   No follow-ups on file.

## 2022-04-27 LAB — HEPATIC FUNCTION PANEL
ALT: 12 IU/L (ref 0–32)
AST: 16 IU/L (ref 0–40)
Albumin: 4.5 g/dL (ref 3.8–4.9)
Alkaline Phosphatase: 105 IU/L (ref 44–121)
Bilirubin Total: 0.3 mg/dL (ref 0.0–1.2)
Bilirubin, Direct: 0.12 mg/dL (ref 0.00–0.40)
Total Protein: 6.6 g/dL (ref 6.0–8.5)

## 2022-04-27 MED ORDER — TERBINAFINE HCL 250 MG PO TABS: 250.0000 mg | ORAL_TABLET | Freq: Every day | ORAL | 0 refills | Status: DC

## 2022-04-27 NOTE — Addendum Note (Signed)
Addended by: Boneta Lucks on: 04/27/2022 08:00 AM   Modules accepted: Orders

## 2022-05-05 ENCOUNTER — Telehealth: Payer: Medicare Other

## 2022-05-05 ENCOUNTER — Telehealth: Payer: Self-pay

## 2022-05-05 NOTE — Telephone Encounter (Signed)
  Care Management   Follow Up Note   05/05/2022 Name: Cheryl Payne MRN: [id number] DOB: [birth year]   Referred by: [name], DO Reason for referral : Care Coordination (RNCM: Follow up for Chronic Disease Management and Care coordination Needs)   An unsuccessful telephone outreach was attempted today. The patient was referred to the case management team for assistance with care management and care coordination.   Follow Up Plan: A HIPPA compliant phone message was left for the patient providing contact information and requesting a return call.   Noreene Larsson RN, MSN, [place] Mobile: [phone]

## 2022-05-06 NOTE — Chronic Care Management (AMB) (Signed)
  Care Coordination Note  05/06/2022 Name: Cheryl Payne MRN: [id number] DOB: 1965-12-30  Cheryl Payne is a 56 y.o. year old female who is a primary care patient of Cheryl Hauser, DO and is actively engaged with the care management team. I reached out to Cheryl Payne by phone today to assist with re-scheduling a follow up visit with the Pharmacist  Follow up plan: Telephone appointment with care management team member scheduled for:05/09/2022  Cheryl Payne, [place], [place], [place] [address] Direct Dial: [phone] [email or web address] Website: [email or web address]

## 2022-05-09 ENCOUNTER — Telehealth: Payer: Medicare Other

## 2022-05-09 ENCOUNTER — Telehealth: Payer: Self-pay | Admitting: Pharmacist

## 2022-05-09 NOTE — Telephone Encounter (Signed)
  Chronic Care Management   Outreach Note  05/09/2022 Name: Cheryl Payne MRN: [id number] DOB: March 31, 1966  Referred by: [name], DO Reason for referral : No chief complaint on file.  Was unable to reach patient via telephone today and have left HIPAA compliant voicemail asking patient to return my call. Outreach attempt #2.   Follow Up Plan: Will collaborate with Care Guide to outreach to schedule follow up with me  Wallace Cullens, PharmD, [facility] [phone]

## 2022-05-10 DIAGNOSIS — H2513 Age-related nuclear cataract, bilateral: Secondary | ICD-10-CM | POA: Diagnosis not present

## 2022-05-12 DIAGNOSIS — H2513 Age-related nuclear cataract, bilateral: Secondary | ICD-10-CM | POA: Diagnosis not present

## 2022-05-19 ENCOUNTER — Telehealth: Payer: Self-pay

## 2022-05-19 NOTE — Chronic Care Management (AMB) (Unsigned)
  Care Coordination Note  05/19/2022 Name: Cheryl Payne MRN: [id number] DOB: [birth year]  Cheryl Payne is a 56 y.o. year old female who is a primary care patient of [name], DO and is actively engaged with the care management team. I reached out to [name] by phone today to assist with re-scheduling a follow up visit with the RN Case Manager and Pharmacist  Follow up plan: Unsuccessful telephone outreach attempt made. A HIPAA compliant phone message was left for the patient providing contact information and requesting a return call.  The care management team will reach out to the patient again over the next 7 days.  If patient returns call to provider office, please advise to call [facility]  [facility] [name], [name], [name], [name] [address] Direct Dial: [phone] [name] Website: [email or web address]

## 2022-05-24 NOTE — Chronic Care Management (AMB) (Signed)
  Care Coordination Note  05/24/2022 Name: [name] MRN: [id number] DOB: 18-Jun-1966  Cheryl Payne is a 56 y.o. year old female who is a primary care patient of [name], DO and is actively engaged with the care management team. I reached out to [name] by phone today to assist with re-scheduling a follow up visit with the RN Case Manager and Pharmacist  Follow up plan: Telephone appointment with care management team member scheduled for: [facility] 06/02/2022  Patient declines further follow up and engagement by the Pharm D. Appropriate care team members and provider have been notified via electronic communication.   Noreene Larsson, [place], [place], [place] [address] Direct Dial: [phone] [email or web address]: [email or web address]

## 2022-06-02 ENCOUNTER — Telehealth: Payer: Medicare Other

## 2022-06-02 ENCOUNTER — Ambulatory Visit: Payer: Self-pay

## 2022-06-02 DIAGNOSIS — G8929 Other chronic pain: Secondary | ICD-10-CM

## 2022-06-02 DIAGNOSIS — F3341 Major depressive disorder, recurrent, in partial remission: Secondary | ICD-10-CM

## 2022-06-02 DIAGNOSIS — J432 Centrilobular emphysema: Secondary | ICD-10-CM

## 2022-06-02 DIAGNOSIS — F411 Generalized anxiety disorder: Secondary | ICD-10-CM

## 2022-06-02 DIAGNOSIS — I10 Essential (primary) hypertension: Secondary | ICD-10-CM

## 2022-06-02 DIAGNOSIS — E7800 Pure hypercholesterolemia, unspecified: Secondary | ICD-10-CM

## 2022-06-02 NOTE — Chronic Care Management (AMB) (Deleted)
Chronic Care Management   CCM RN Visit Note  06/02/2022 Name: Cheryl Payne MRN: [id number] DOB: 22-Jun-1966  Subjective: Cheryl Payne is a 56 y.o. year old female who is a primary care patient of [name], DO. The care management team was consulted for assistance with disease management and care coordination needs.    Engaged with patient by telephone for follow up visit in response to provider referral for case management and/or care coordination services.   Consent to Services:  The patient was given information about Chronic Care Management services, agreed to services, and gave verbal consent prior to initiation of services.  Please see initial visit note for detailed documentation.   Patient agreed to services and verbal consent obtained.   Assessment: Review of patient past medical history, allergies, medications, health status, including review of consultants reports, laboratory and other test data, was performed as part of comprehensive evaluation and provision of chronic care management services.   SDOH (Social Determinants of Health) assessments and interventions performed:    CCM Care Plan  No Known Allergies  Outpatient Encounter Medications as of 06/02/2022  Medication Sig   amLODipine (NORVASC) 5 MG tablet TAKE 1 TABLET(5 MG) BY MOUTH DAILY   aspirin EC 81 MG tablet Take 81 mg by mouth daily. Swallow whole.   escitalopram (LEXAPRO) 10 MG tablet TAKE 1 TABLET(10 MG) BY MOUTH DAILY. START ONCE FINISHED CITALOPRAM 10 MG DOSAGE TAPER OFF   gabapentin (NEURONTIN) 300 MG capsule TAKE 1 CAPSULE BY MOUTH AT BEDTIME. MAY INCREASE TO 2 CAPSULES AT BEDTIME AS TOLERATED. ALSO. MAY TAKE 1 CAPSULE DURNING DAY AS NEEDED   meloxicam (MOBIC) 15 MG tablet TAKE 1 TABLET(15 MG) BY MOUTH DAILY AS NEEDED FOR PAIN   metoprolol tartrate (LOPRESSOR) 25 MG tablet TAKE 1 TABLET(25 MG) BY MOUTH TWICE DAILY   rosuvastatin (CRESTOR) 10 MG tablet Take 1 tablet (10 mg total) by  mouth daily.   terbinafine (LAMISIL) 250 MG tablet Take 1 tablet (250 mg total) by mouth daily.   terbinafine (LAMISIL) 250 MG tablet Take 1 tablet (250 mg total) by mouth daily.   No facility-administered encounter medications on file as of 06/02/2022.    Patient Active Problem List   Diagnosis Date Noted   Chronic left-sided low back pain with left-sided sciatica 09/21/2021   Pure hypercholesterolemia 07/13/2020   Major depressive disorder, recurrent, in partial remission ([facility]) 07/13/2020   GAD (generalized anxiety disorder) 07/13/2020   Encounter for screening mammogram for malignant neoplasm of breast 07/13/2020   Muscle spasm of left shoulder 07/13/2020   Chronic left shoulder pain 07/13/2020   Centrilobular emphysema ([place]) 07/13/2020   Moderate aortic valve insufficiency 07/13/2020   Morbid obesity ([facility]) 07/13/2020   Non-small cell carcinoma of lung ([place]) 01/02/2019   Essential hypertension 09/02/2014    Conditions to be addressed/monitored:HTN, HLD, COPD, Anxiety, Depression, and chronic pain  Care Plan : RNCM: General Plan of Care for Chronic Disease Management and Care Coordination Needs  Updates made by Vanita Ingles, RN since 06/02/2022 12:00 AM     Problem: RNCM: Development of Plan of Care for Chronic Disease Management (HTN, HLD, Depression, Anxiety, COPD,  Pain)   Priority: High     Long-Range Goal: RNCM: Effective Management of Plan of Care for Chronic Disease Management (HTN, HLD, Depression, Anxiety, Pain, COPD   Start Date: 03/31/2022  Expected End Date: 04/01/2023  Priority: High  Note:   Current Barriers:  Knowledge Deficits related to plan of care for management of  HTN, HLD, COPD, Chronic Pain, and Depression, anxiety  Care Coordination needs related to Mental Health Concerns   Chronic Disease Management support and education needs related to HTN, HLD, COPD, Chronic Pain, and Depression and anxiety  RNCM Clinical Goal(s):  Patient will verbalize basic  understanding of HTN, HLD, COPD, Anxiety, Depression, and Chronic pain disease process and self health management plan as evidenced by keeping appointments, following the plan of care and working with the CCM team to effectively manage health and well being take all medications exactly as prescribed and will call provider for medication related questions as evidenced by compliance with medications and calling for refills before running out of medications    attend all scheduled medical appointments: with pcp and specialist as evidenced by keeping appointments and calling for schedule change needs        demonstrate improved and ongoing adherence to prescribed treatment plan for HTN, HLD, COPD, Anxiety, Depression, and Chronic pain  as evidenced by stable conditions, stable VS, stable lab work, no acute exacerbations of conditions work with pharmacist to address Medication procurement related to HTN, HLD, COPD, Anxiety, Depression, and Chronic pain  as evidenced by review of EMR and patient or pharmacist report    demonstrate ongoing self health care management ability for effective management of chronic conditions as evidenced by working with the CCM team through collaboration with Consulting civil engineer, provider, and care team.   Interventions: 1:1 collaboration with primary care provider regarding development and update of comprehensive plan of care as evidenced by provider attestation and co-signature Inter-disciplinary care team collaboration (see longitudinal plan of care) Evaluation of current treatment plan related to  self management and patient's adherence to plan as established by provider   COPD: (Status: Goal on Track (progressing): YES.) Long Term Goal  Reviewed medications with patient, including use of prescribed maintenance and rescue inhalers, and provided instruction on medication management and the importance of adherence. 06-02-2022: The patient states her COPD is stable right now. She is  monitoring for changes.  Provided patient with basic written and verbal COPD education on self care/management/and exacerbation prevention Advised patient to track and manage COPD triggers. 06-02-2022: Review of factors that cause exacerbation of COPD. The patient is mindful of weather changes and other things that may cause her to have issues with her breathing.   Provided written and verbal instructions on pursed lip breathing and utilized returned demonstration as teach back Provided instruction about proper use of medications used for management of COPD including inhalers. 06-02-2022: Is compliant with medications and takes as prescribed.  Advised patient to self assesses COPD action plan zone and make appointment with provider if in the yellow zone for 48 hours without improvement Advised patient to engage in light exercise as tolerated 3-5 days a week to aid in the the management of COPD Provided education about and advised patient to utilize infection prevention strategies to reduce risk of respiratory infection Discussed the importance of adequate rest and management of fatigue with COPD  Depression and Anxiety  (Status: Goal on Track (progressing): YES.) Long Term Goal  Evaluation of current treatment plan related to Anxiety and Depression, Mental Health Concerns  self-management and patient's adherence to plan as established by provider. 06-02-2022: The patient states that she is doing okay. She says she is unsure if the medication is working as well as it once was. The patient states that she likes to listen to worship music and this is very helpful. The patient denies any  acute findings.  Discussed plans with patient for ongoing care management follow up and provided patient with direct contact information for care management team Advised patient to call the office for changes in mood, anxiety, depression, and changes in mental health; Provided education to patient re: on the benefits of the  LCSW and availability, also the support of the CCM team for meeting health and wellness needs. 06-02-2022: Reviewed available resources and the patient is aware of resources of LCSW and also outside resources like [place]. The patient is declining any new resources at this time but knows they are available; Reviewed medications with patient and discussed compliance. The patient wants to have her medications all delivered at the same time. Will illicite the help of the pharm D for assistance with medications. 06-02-2022: The patient is taking medications as directed; Provided patient with depression and anxiety educational materials related to stress relief and being mindful of ways to reduce stress and anxiety; Discussed plans with patient for ongoing care management follow up and provided patient with direct contact information for care management team; Advised patient to discuss changes in mood, anxiety, depression, and mental health concerns  with provider; Screening for signs and symptoms of depression related to chronic disease state;  Assessed social determinant of health barriers;   Hyperlipidemia:  (Status: Goal on Track (progressing): YES.) Long Term Goal  Lab Results  Component Value Date   CHOL 203 (H) 08/17/2021   HDL 56 08/17/2021   LDLCALC 120 (H) 08/17/2021   TRIG 157 (H) 08/17/2021   CHOLHDL 3.6 08/17/2021     Medication review performed; medication list updated in electronic medical record. 06-02-2022: The patient takes Crestor 10 mg QD and is compliant with medications Provider established cholesterol goals reviewed; Counseled on importance of regular laboratory monitoring as prescribed; Provided HLD educational materials; Reviewed role and benefits of statin for ASCVD risk reduction; Discussed strategies to manage statin-induced myalgias; Reviewed importance of limiting foods high in cholesterol. 06-02-2022: Reviewed with the patient heart healthy diet and eating  healthy Reviewed exercise goals and target of 150 minutes per week; Screening for signs and symptoms of depression related to chronic disease state;  Assessed social determinant of health barriers;   Hypertension: (Status: Goal on Track (progressing): YES.) Long Term Goal  Last practice recorded BP readings:  BP Readings from Last 3 Encounters:  12/23/21 124/75  09/21/21 140/84  08/24/21 132/67  Most recent eGFR/CrCl:  Lab Results  Component Value Date   EGFR 89 08/17/2021    No components found for: CRCL  Evaluation of current treatment plan related to hypertension self management and patient's adherence to plan as established by provider. 06-02-2022: The patient states that she feels like things are going well for her with her HTN and heart health. She states her blood pressures have been stable;   Provided education to patient re: stroke prevention, s/s of heart attack and stroke; Reviewed prescribed diet heart healthy diet. 06-02-2022: The patient is compliant with a heart healthy diet.  Reviewed medications with patient and discussed importance of compliance. 06-02-2022: The patient is compliant with medications;  Counseled on the importance of exercise goals with target of 150 minutes per week. 06-02-2022: The patient was going to the gym but has not been in about 2 months. She has had some things go on with her boyfriend and him starting dialysis. Education and support provided.  Discussed plans with patient for ongoing care management follow up and provided patient with direct contact  information for care management team; Advised patient, providing education and rationale, to monitor blood pressure daily and record, calling PCP for findings outside established parameters;  Advised patient to discuss HTN and other concerns with heart health with provider; Provided education on prescribed diet heart healthy;  Discussed complications of poorly controlled blood pressure such as heart  disease, stroke, circulatory complications, vision complications, kidney impairment, sexual dysfunction;   Pain:  (Status: Goal on Track (progressing): YES.) Long Term Goal  Pain assessment performed. 06-02-2022: The patient states that her pain level today is a 4 in her left shoulder. The patient states that it mostly hurts her more at night when she is laying down going to bed. The patient states it is better than what it was and she is hopeful it will get better.  Medications reviewed. 06-02-2022: The patient is compliant with medications.  Reviewed provider established plan for pain management. 06-02-2022: The patient has been seen and evaluated for shoulder pain. Knows to call the provider for changes in level of pain, intensity of pain or unresolved pain Discussed importance of adherence to all scheduled medical appointments; Counseled on the importance of reporting any/all new or changed pain symptoms or management strategies to pain management provider; Advised patient to report to care team affect of pain on daily activities; Discussed use of relaxation techniques and/or diversional activities to assist with pain reduction (distraction, imagery, relaxation, massage, acupressure, TENS, heat, and cold application; Reviewed with patient prescribed pharmacological and nonpharmacological pain relief strategies; Advised patient to discuss call the provider for changes in pain, unresolved pain  with provider;  Patient Goals/Self-Care Activities: Take medications as prescribed   Attend all scheduled provider appointments Call pharmacy for medication refills 3-7 days in advance of running out of medications Attend church or other social activities Perform all self care activities independently  Perform IADL's (shopping, preparing meals, housekeeping, managing finances) independently Call provider office for new concerns or questions  Work with the social worker to address care coordination needs  and will continue to work with the clinical team to address health care and disease management related needs call the Suicide and Crisis Lifeline: 988 call the [place] National Suicide Prevention Lifeline: 4160041997 or TTY: 224 262 4288 TTY (757) 586-9895) to talk to a trained counselor call 1-800-273-TALK (toll free, 24 hour hotline) if experiencing a Mental Health or [place]  check blood pressure weekly choose a place to take my blood pressure (home, clinic or office, retail store) write blood pressure results in a log or diary learn about high blood pressure keep a blood pressure log take blood pressure log to all doctor appointments call doctor for signs and symptoms of high blood pressure develop an action plan for high blood pressure keep all doctor appointments take medications for blood pressure exactly as prescribed report new symptoms to your doctor eat more whole grains, fruits and vegetables, lean meats and healthy fats - call for medicine refill 2 or 3 days before it runs out - take all medications exactly as prescribed - call doctor with any symptoms you believe are related to your medicine - call doctor when you experience any new symptoms - go to all doctor appointments as scheduled - adhere to prescribed diet: heart Healthy       Plan:Telephone follow up appointment with care management team member scheduled for:  07-14-2022 at [place] am  Noreene Larsson RN, MSN, [place] Mobile: [phone]

## 2022-06-02 NOTE — Chronic Care Management (AMB) (Signed)
Care Management    RN Visit Note  06/02/2022 Name: Cheryl Payne MRN: [id number] DOB: Mar 25, 1966  Subjective: Cheryl Payne is a 56 y.o. year old female who is a primary care patient of Cheryl Hauser, DO. The care management team was consulted for assistance with disease management and care coordination needs.    Engaged with patient by telephone for follow up visit in response to provider referral for case management and/or care coordination services.   Consent to Services:   Cheryl Payne was given information about Care Management services today including:  Care Management services includes personalized support from designated clinical staff supervised by her physician, including individualized plan of care and coordination with other care providers 24/7 contact phone numbers for assistance for urgent and routine care needs. The patient may stop case management services at any time by phone call to the office staff.  Patient agreed to services and consent obtained.   Assessment: Review of patient past medical history, allergies, medications, health status, including review of consultants reports, laboratory and other test data, was performed as part of comprehensive evaluation and provision of chronic care management services.   SDOH (Social Determinants of Health) assessments and interventions performed:    Care Plan  No Known Allergies  Outpatient Encounter Medications as of 06/02/2022  Medication Sig   amLODipine (NORVASC) 5 MG tablet TAKE 1 TABLET(5 MG) BY MOUTH DAILY   aspirin EC 81 MG tablet Take 81 mg by mouth daily. Swallow whole.   escitalopram (LEXAPRO) 10 MG tablet TAKE 1 TABLET(10 MG) BY MOUTH DAILY. START ONCE FINISHED CITALOPRAM 10 MG DOSAGE TAPER OFF   gabapentin (NEURONTIN) 300 MG capsule TAKE 1 CAPSULE BY MOUTH AT BEDTIME. MAY INCREASE TO 2 CAPSULES AT BEDTIME AS TOLERATED. ALSO. MAY TAKE 1 CAPSULE DURNING DAY AS NEEDED   meloxicam (MOBIC) 15 MG tablet  TAKE 1 TABLET(15 MG) BY MOUTH DAILY AS NEEDED FOR PAIN   metoprolol tartrate (LOPRESSOR) 25 MG tablet TAKE 1 TABLET(25 MG) BY MOUTH TWICE DAILY   rosuvastatin (CRESTOR) 10 MG tablet Take 1 tablet (10 mg total) by mouth daily.   terbinafine (LAMISIL) 250 MG tablet Take 1 tablet (250 mg total) by mouth daily.   terbinafine (LAMISIL) 250 MG tablet Take 1 tablet (250 mg total) by mouth daily.   No facility-administered encounter medications on file as of 06/02/2022.    Patient Active Problem List   Diagnosis Date Noted   Chronic left-sided low back pain with left-sided sciatica 09/21/2021   Pure hypercholesterolemia 07/13/2020   Major depressive disorder, recurrent, in partial remission ([place]) 07/13/2020   GAD (generalized anxiety disorder) 07/13/2020   Encounter for screening mammogram for malignant neoplasm of breast 07/13/2020   Muscle spasm of left shoulder 07/13/2020   Chronic left shoulder pain 07/13/2020   Centrilobular emphysema ([place]) 07/13/2020   Moderate aortic valve insufficiency 07/13/2020   Morbid obesity ([place]) 07/13/2020   Non-small cell carcinoma of lung ([place]) 01/02/2019   Essential hypertension 09/02/2014    Conditions to be addressed/monitored: HTN, HLD, COPD, Anxiety, Depression, and chronic pain  Care Plan : RNCM: General Plan of Care for Chronic Disease Management and Care Coordination Needs  Updates made by Vanita Ingles, RN since 06/02/2022 12:00 AM     Problem: RNCM: Development of Plan of Care for Chronic Disease Management (HTN, HLD, Depression, Anxiety, COPD,  Pain)   Priority: High     Long-Range Goal: RNCM: Effective Management of Plan of Care for Chronic Disease Management (HTN, HLD,  Depression, Anxiety, Pain, COPD   Start Date: 03/31/2022  Expected End Date: 04/01/2023  Priority: High  Note:   Current Barriers:  Knowledge Deficits related to plan of care for management of HTN, HLD, COPD, Chronic Pain, and Depression, anxiety  Care Coordination needs  related to Mental Health Concerns   Chronic Disease Management support and education needs related to HTN, HLD, COPD, Chronic Pain, and Depression and anxiety  RNCM Clinical Goal(s):  Patient will verbalize basic understanding of HTN, HLD, COPD, Anxiety, Depression, and Chronic pain disease process and self health management plan as evidenced by keeping appointments, following the plan of care and working with the CCM team to effectively manage health and well being take all medications exactly as prescribed and will call provider for medication related questions as evidenced by compliance with medications and calling for refills before running out of medications    attend all scheduled medical appointments: with pcp and specialist as evidenced by keeping appointments and calling for schedule change needs        demonstrate improved and ongoing adherence to prescribed treatment plan for HTN, HLD, COPD, Anxiety, Depression, and Chronic pain  as evidenced by stable conditions, stable VS, stable lab work, no acute exacerbations of conditions work with pharmacist to address Medication procurement related to HTN, HLD, COPD, Anxiety, Depression, and Chronic pain  as evidenced by review of EMR and patient or pharmacist report    demonstrate ongoing self health care management ability for effective management of chronic conditions as evidenced by working with the CCM team through collaboration with Consulting civil engineer, provider, and care team.   Interventions: 1:1 collaboration with primary care provider regarding development and update of comprehensive plan of care as evidenced by provider attestation and co-signature Inter-disciplinary care team collaboration (see longitudinal plan of care) Evaluation of current treatment plan related to  self management and patient's adherence to plan as established by provider   COPD: (Status: Goal on Track (progressing): YES.) Long Term Goal  Reviewed medications with  patient, including use of prescribed maintenance and rescue inhalers, and provided instruction on medication management and the importance of adherence. 06-02-2022: The patient states her COPD is stable right now. She is monitoring for changes.  Provided patient with basic written and verbal COPD education on self care/management/and exacerbation prevention Advised patient to track and manage COPD triggers. 06-02-2022: Review of factors that cause exacerbation of COPD. The patient is mindful of weather changes and other things that may cause her to have issues with her breathing.   Provided written and verbal instructions on pursed lip breathing and utilized returned demonstration as teach back Provided instruction about proper use of medications used for management of COPD including inhalers. 06-02-2022: Is compliant with medications and takes as prescribed.  Advised patient to self assesses COPD action plan zone and make appointment with provider if in the yellow zone for 48 hours without improvement Advised patient to engage in light exercise as tolerated 3-5 days a week to aid in the the management of COPD Provided education about and advised patient to utilize infection prevention strategies to reduce risk of respiratory infection Discussed the importance of adequate rest and management of fatigue with COPD  Depression and Anxiety  (Status: Goal on Track (progressing): YES.) Long Term Goal  Evaluation of current treatment plan related to Anxiety and Depression, Mental Health Concerns  self-management and patient's adherence to plan as established by provider. 06-02-2022: The patient states that she is doing okay. She says  she is unsure if the medication is working as well as it once was. The patient states that she likes to listen to worship music and this is very helpful. The patient denies any acute findings.  Discussed plans with patient for ongoing care management follow up and provided patient with  direct contact information for care management team Advised patient to call the office for changes in mood, anxiety, depression, and changes in mental health; Provided education to patient re: on the benefits of the LCSW and availability, also the support of the CCM team for meeting health and wellness needs. 06-02-2022: Reviewed available resources and the patient is aware of resources of LCSW and also outside resources like [place]. The patient is declining any new resources at this time but knows they are available; Reviewed medications with patient and discussed compliance. The patient wants to have her medications all delivered at the same time. Will illicite the help of the pharm D for assistance with medications. 06-02-2022: The patient is taking medications as directed; Provided patient with depression and anxiety educational materials related to stress relief and being mindful of ways to reduce stress and anxiety; Discussed plans with patient for ongoing care management follow up and provided patient with direct contact information for care management team; Advised patient to discuss changes in mood, anxiety, depression, and mental health concerns  with provider; Screening for signs and symptoms of depression related to chronic disease state;  Assessed social determinant of health barriers;   Hyperlipidemia:  (Status: Goal on Track (progressing): YES.) Long Term Goal  Lab Results  Component Value Date   CHOL 203 (H) 08/17/2021   HDL 56 08/17/2021   LDLCALC 120 (H) 08/17/2021   TRIG 157 (H) 08/17/2021   CHOLHDL 3.6 08/17/2021     Medication review performed; medication list updated in electronic medical record. 06-02-2022: The patient takes Crestor 10 mg QD and is compliant with medications Provider established cholesterol goals reviewed; Counseled on importance of regular laboratory monitoring as prescribed; Provided HLD educational materials; Reviewed role and benefits of  statin for ASCVD risk reduction; Discussed strategies to manage statin-induced myalgias; Reviewed importance of limiting foods high in cholesterol. 06-02-2022: Reviewed with the patient heart healthy diet and eating healthy Reviewed exercise goals and target of 150 minutes per week; Screening for signs and symptoms of depression related to chronic disease state;  Assessed social determinant of health barriers;   Hypertension: (Status: Goal on Track (progressing): YES.) Long Term Goal  Last practice recorded BP readings:  BP Readings from Last 3 Encounters:  12/23/21 124/75  09/21/21 140/84  08/24/21 132/67  Most recent eGFR/CrCl:  Lab Results  Component Value Date   EGFR 89 08/17/2021    No components found for: CRCL  Evaluation of current treatment plan related to hypertension self management and patient's adherence to plan as established by provider. 06-02-2022: The patient states that she feels like things are going well for her with her HTN and heart health. She states her blood pressures have been stable;   Provided education to patient re: stroke prevention, s/s of heart attack and stroke; Reviewed prescribed diet heart healthy diet. 06-02-2022: The patient is compliant with a heart healthy diet.  Reviewed medications with patient and discussed importance of compliance. 06-02-2022: The patient is compliant with medications;  Counseled on the importance of exercise goals with target of 150 minutes per week. 06-02-2022: The patient was going to the gym but has not been in about 2 months. She  has had some things go on with her boyfriend and him starting dialysis. Education and support provided.  Discussed plans with patient for ongoing care management follow up and provided patient with direct contact information for care management team; Advised patient, providing education and rationale, to monitor blood pressure daily and record, calling PCP for findings outside established parameters;   Advised patient to discuss HTN and other concerns with heart health with provider; Provided education on prescribed diet heart healthy;  Discussed complications of poorly controlled blood pressure such as heart disease, stroke, circulatory complications, vision complications, kidney impairment, sexual dysfunction;   Pain:  (Status: Goal on Track (progressing): YES.) Long Term Goal  Pain assessment performed. 06-02-2022: The patient states that her pain level today is a 4 in her left shoulder. The patient states that it mostly hurts her more at night when she is laying down going to bed. The patient states it is better than what it was and she is hopeful it will get better.  Medications reviewed. 06-02-2022: The patient is compliant with medications.  Reviewed provider established plan for pain management. 06-02-2022: The patient has been seen and evaluated for shoulder pain. Knows to call the provider for changes in level of pain, intensity of pain or unresolved pain Discussed importance of adherence to all scheduled medical appointments; Counseled on the importance of reporting any/all new or changed pain symptoms or management strategies to pain management provider; Advised patient to report to care team affect of pain on daily activities; Discussed use of relaxation techniques and/or diversional activities to assist with pain reduction (distraction, imagery, relaxation, massage, acupressure, TENS, heat, and cold application; Reviewed with patient prescribed pharmacological and nonpharmacological pain relief strategies; Advised patient to discuss call the provider for changes in pain, unresolved pain  with provider;  Patient Goals/Self-Care Activities: Take medications as prescribed   Attend all scheduled provider appointments Call pharmacy for medication refills 3-7 days in advance of running out of medications Attend church or other social activities Perform all self care activities  independently  Perform IADL's (shopping, preparing meals, housekeeping, managing finances) independently Call provider office for new concerns or questions  Work with the social worker to address care coordination needs and will continue to work with the clinical team to address health care and disease management related needs call the Suicide and Crisis Lifeline: 988 call the [place] National Suicide Prevention Lifeline: (380)160-7505 or TTY: 845-180-2061 TTY 820-878-4887) to talk to a trained counselor call 1-800-273-TALK (toll free, 24 hour hotline) if experiencing a Mental Health or [place]  check blood pressure weekly choose a place to take my blood pressure (home, clinic or office, retail store) write blood pressure results in a log or diary learn about high blood pressure keep a blood pressure log take blood pressure log to all doctor appointments call doctor for signs and symptoms of high blood pressure develop an action plan for high blood pressure keep all doctor appointments take medications for blood pressure exactly as prescribed report new symptoms to your doctor eat more whole grains, fruits and vegetables, lean meats and healthy fats - call for medicine refill 2 or 3 days before it runs out - take all medications exactly as prescribed - call doctor with any symptoms you believe are related to your medicine - call doctor when you experience any new symptoms - go to all doctor appointments as scheduled - adhere to prescribed diet: heart Healthy       Plan: Telephone follow up  appointment with care management team member scheduled for:  07-14-2022 at [place] am  Noreene Larsson RN, MSN, [place] Mobile: [phone]

## 2022-06-02 NOTE — Patient Instructions (Signed)
Visit Information  Thank you for taking time to visit with me today. Please don't hesitate to contact me if I can be of assistance to you before our next scheduled telephone appointment.  Following are the goals we discussed today:  COPD: (Status: Goal on Track (progressing): YES.) Long Term Goal  Reviewed medications with patient, including use of prescribed maintenance and rescue inhalers, and provided instruction on medication management and the importance of adherence. 06-02-2022: The patient states her COPD is stable right now. She is monitoring for changes.  Provided patient with basic written and verbal COPD education on self care/management/and exacerbation prevention Advised patient to track and manage COPD triggers. 06-02-2022: Review of factors that cause exacerbation of COPD. The patient is mindful of weather changes and other things that may cause her to have issues with her breathing.   Provided written and verbal instructions on pursed lip breathing and utilized returned demonstration as teach back Provided instruction about proper use of medications used for management of COPD including inhalers. 06-02-2022: Is compliant with medications and takes as prescribed.  Advised patient to self assesses COPD action plan zone and make appointment with provider if in the yellow zone for 48 hours without improvement Advised patient to engage in light exercise as tolerated 3-5 days a week to aid in the the management of COPD Provided education about and advised patient to utilize infection prevention strategies to reduce risk of respiratory infection Discussed the importance of adequate rest and management of fatigue with COPD   Depression and Anxiety  (Status: Goal on Track (progressing): YES.) Long Term Goal  Evaluation of current treatment plan related to Anxiety and Depression, Mental Health Concerns  self-management and patient's adherence to plan as established by provider. 06-02-2022: The  patient states that she is doing okay. She says she is unsure if the medication is working as well as it once was. The patient states that she likes to listen to worship music and this is very helpful. The patient denies any acute findings.  Discussed plans with patient for ongoing care management follow up and provided patient with direct contact information for care management team Advised patient to call the office for changes in mood, anxiety, depression, and changes in mental health; Provided education to patient re: on the benefits of the LCSW and availability, also the support of the CCM team for meeting health and wellness needs. 06-02-2022: Reviewed available resources and the patient is aware of resources of LCSW and also outside resources like [place]. The patient is declining any new resources at this time but knows they are available; Reviewed medications with patient and discussed compliance. The patient wants to have her medications all delivered at the same time. Will illicite the help of the pharm D for assistance with medications. 06-02-2022: The patient is taking medications as directed; Provided patient with depression and anxiety educational materials related to stress relief and being mindful of ways to reduce stress and anxiety; Discussed plans with patient for ongoing care management follow up and provided patient with direct contact information for care management team; Advised patient to discuss changes in mood, anxiety, depression, and mental health concerns  with provider; Screening for signs and symptoms of depression related to chronic disease state;  Assessed social determinant of health barriers;    Hyperlipidemia:  (Status: Goal on Track (progressing): YES.) Long Term Goal       Lab Results  Component Value Date    CHOL 203 (H) 08/17/2021  HDL 56 08/17/2021    LDLCALC 120 (H) 08/17/2021    TRIG 157 (H) 08/17/2021    CHOLHDL 3.6 08/17/2021       Medication review performed; medication list updated in electronic medical record. 06-02-2022: The patient takes Crestor 10 mg QD and is compliant with medications Provider established cholesterol goals reviewed; Counseled on importance of regular laboratory monitoring as prescribed; Provided HLD educational materials; Reviewed role and benefits of statin for ASCVD risk reduction; Discussed strategies to manage statin-induced myalgias; Reviewed importance of limiting foods high in cholesterol. 06-02-2022: Reviewed with the patient heart healthy diet and eating healthy Reviewed exercise goals and target of 150 minutes per week; Screening for signs and symptoms of depression related to chronic disease state;  Assessed social determinant of health barriers;    Hypertension: (Status: Goal on Track (progressing): YES.) Long Term Goal  Last practice recorded BP readings:     BP Readings from Last 3 Encounters:  12/23/21 124/75  09/21/21 140/84  08/24/21 132/67  Most recent eGFR/CrCl:       Lab Results  Component Value Date    EGFR 89 08/17/2021    No components found for: CRCL   Evaluation of current treatment plan related to hypertension self management and patient's adherence to plan as established by provider. 06-02-2022: The patient states that she feels like things are going well for her with her HTN and heart health. She states her blood pressures have been stable;   Provided education to patient re: stroke prevention, s/s of heart attack and stroke; Reviewed prescribed diet heart healthy diet. 06-02-2022: The patient is compliant with a heart healthy diet.  Reviewed medications with patient and discussed importance of compliance. 06-02-2022: The patient is compliant with medications;  Counseled on the importance of exercise goals with target of 150 minutes per week. 06-02-2022: The patient was going to the gym but has not been in about 2 months. She has had some things go on with her  boyfriend and him starting dialysis. Education and support provided.  Discussed plans with patient for ongoing care management follow up and provided patient with direct contact information for care management team; Advised patient, providing education and rationale, to monitor blood pressure daily and record, calling PCP for findings outside established parameters;  Advised patient to discuss HTN and other concerns with heart health with provider; Provided education on prescribed diet heart healthy;  Discussed complications of poorly controlled blood pressure such as heart disease, stroke, circulatory complications, vision complications, kidney impairment, sexual dysfunction;    Pain:  (Status: Goal on Track (progressing): YES.) Long Term Goal  Pain assessment performed. 06-02-2022: The patient states that her pain level today is a 4 in her left shoulder. The patient states that it mostly hurts her more at night when she is laying down going to bed. The patient states it is better than what it was and she is hopeful it will get better.  Medications reviewed. 06-02-2022: The patient is compliant with medications.  Reviewed provider established plan for pain management. 06-02-2022: The patient has been seen and evaluated for shoulder pain. Knows to call the provider for changes in level of pain, intensity of pain or unresolved pain Discussed importance of adherence to all scheduled medical appointments; Counseled on the importance of reporting any/all new or changed pain symptoms or management strategies to pain management provider; Advised patient to report to care team affect of pain on daily activities; Discussed use of relaxation techniques and/or diversional activities to  assist with pain reduction (distraction, imagery, relaxation, massage, acupressure, TENS, heat, and cold application; Reviewed with patient prescribed pharmacological and nonpharmacological pain relief strategies; Advised patient  to discuss call the provider for changes in pain, unresolved pain  with provider;  Our next appointment is by telephone on 07-14-2022 at 0945 am  Please call the care guide team at [phone] if you need to cancel or reschedule your appointment.   If you are experiencing a Mental Health or [place] or need someone to talk to, please call the Suicide and Crisis Lifeline: 988 call the [place] National Suicide Prevention Lifeline: 435-143-8826 or TTY: 806 796 3362 TTY 916-346-9346) to talk to a trained counselor call 1-800-273-TALK (toll free, 24 hour hotline)   Patient verbalizes understanding of instructions and care plan provided today and agrees to view in [place]. Active MyChart status and patient understanding of how to access instructions and care plan via MyChart confirmed with patient.     Telephone follow up appointment with care management team member scheduled for: 07-14-2022 at [place] am   Noreene Larsson RN, MSN, [place] Mobile: [phone]

## 2022-06-17 ENCOUNTER — Other Ambulatory Visit: Payer: Self-pay | Admitting: Family Medicine

## 2022-06-17 DIAGNOSIS — G8929 Other chronic pain: Secondary | ICD-10-CM

## 2022-06-17 NOTE — Telephone Encounter (Signed)
Requested Prescriptions  Pending Prescriptions Disp Refills  . meloxicam (MOBIC) 15 MG tablet [Pharmacy Med Name: MELOXICAM $RemoveBeforeD'15MG'[id number]$  TABLETS] 30 tablet 2    Sig: TAKE 1 TABLET(15 MG) BY MOUTH DAILY AS NEEDED FOR PAIN     Analgesics:  COX2 Inhibitors Failed - 06/17/2022 10:16 AM      Failed - Manual Review: Labs are only required if the patient has taken medication for more than 8 weeks.      Passed - HGB in normal range and within 360 days    Hemoglobin  Date Value Ref Range Status  08/17/2021 13.0 11.7 - 15.5 g/dL Final         Passed - Cr in normal range and within 360 days    Creat  Date Value Ref Range Status  08/17/2021 0.78 0.50 - 1.03 mg/dL Final         Passed - HCT in normal range and within 360 days    HCT  Date Value Ref Range Status  08/17/2021 39.1 35.0 - 45.0 % Final         Passed - AST in normal range and within 360 days    AST  Date Value Ref Range Status  04/26/2022 16 0 - 40 IU/L Final         Passed - ALT in normal range and within 360 days    ALT  Date Value Ref Range Status  04/26/2022 12 0 - 32 IU/L Final         Passed - eGFR is 30 or above and within 360 days    GFR, Est African American  Date Value Ref Range Status  08/13/2020 99 > OR = 60 mL/min/1.40m2 Final   GFR, Est Non African American  Date Value Ref Range Status  08/13/2020 86 > OR = 60 mL/min/1.69m2 Final   eGFR  Date Value Ref Range Status  08/17/2021 89 > OR = 60 mL/min/1.11m2 Final    Comment:    The eGFR is based on the CKD-EPI 2021 equation. To calculate  the new eGFR from a previous Creatinine or Cystatin C result, go to https://www.kidney.org/professionals/ kdoqi/gfr%5Fcalculator          Passed - Patient is not pregnant      Passed - Valid encounter within last 12 months    Recent Outpatient Visits          3 months ago Chronic left shoulder pain   [place], DO   5 months ago Vaginal discharge   [place], DO   8 months ago Chronic left-sided low back pain with left-sided sciatica   [place], DO   9 months ago Annual physical exam   [place], DO   10 months ago Chronic left-sided low back pain with left-sided sciatica   [facility] [place], Coralie Keens, NP

## 2022-07-02 ENCOUNTER — Other Ambulatory Visit: Payer: Self-pay | Admitting: Family Medicine

## 2022-07-02 DIAGNOSIS — E7800 Pure hypercholesterolemia, unspecified: Secondary | ICD-10-CM

## 2022-07-04 NOTE — Telephone Encounter (Signed)
Requested Prescriptions  Pending Prescriptions Disp Refills  . rosuvastatin (CRESTOR) 10 MG tablet [Pharmacy Med Name: ROSUVASTATIN 10MG  TABLETS] 90 tablet 0    Sig: TAKE 1 TABLET(10 MG) BY MOUTH DAILY     Cardiovascular:  Antilipid - Statins 2 Failed - 07/02/2022  6:31 AM      Failed - Lipid Panel in normal range within the last 12 months    Cholesterol  Date Value Ref Range Status  08/17/2021 203 (H) <200 mg/dL Final   LDL Cholesterol (Calc)  Date Value Ref Range Status  08/17/2021 120 (H) mg/dL (calc) Final    Comment:    Reference range: <100 . Desirable range <100 mg/dL for primary prevention;   <70 mg/dL for patients with CHD or diabetic patients  with > or = 2 CHD risk factors. Marland Kitchen LDL-C is now calculated using the Martin-Hopkins  calculation, which is a validated novel method providing  better accuracy than the Friedewald equation in the  estimation of LDL-C.  Cresenciano Genre et al. Annamaria Helling. 4562;563(89): 2061-2068  (http://education.[employer].com/faq/FAQ164)    HDL  Date Value Ref Range Status  08/17/2021 56 > OR = 50 mg/dL Final   Triglycerides  Date Value Ref Range Status  08/17/2021 157 (H) <150 mg/dL Final         Passed - Cr in normal range and within 360 days    Creat  Date Value Ref Range Status  08/17/2021 0.78 0.50 - 1.03 mg/dL Final         Passed - Patient is not pregnant      Passed - Valid encounter within last 12 months    Recent Outpatient Visits          3 months ago Chronic left shoulder pain   [place], DO   6 months ago Vaginal discharge   [place], DO   9 months ago Chronic left-sided low back pain with left-sided sciatica   [place], DO   10 months ago Annual physical exam   [place], DO   10 months ago Chronic left-sided low back pain with left-sided sciatica    [facility] [place], Coralie Keens, NP

## 2022-07-12 DIAGNOSIS — C3411 Malignant neoplasm of upper lobe, right bronchus or lung: Secondary | ICD-10-CM | POA: Diagnosis not present

## 2022-07-14 ENCOUNTER — Ambulatory Visit: Payer: Self-pay

## 2022-07-14 ENCOUNTER — Other Ambulatory Visit: Payer: Self-pay | Admitting: Family Medicine

## 2022-07-14 ENCOUNTER — Telehealth: Payer: Medicare Other

## 2022-07-14 DIAGNOSIS — M7552 Bursitis of left shoulder: Secondary | ICD-10-CM

## 2022-07-14 DIAGNOSIS — C3411 Malignant neoplasm of upper lobe, right bronchus or lung: Secondary | ICD-10-CM | POA: Diagnosis not present

## 2022-07-14 DIAGNOSIS — G8929 Other chronic pain: Secondary | ICD-10-CM

## 2022-07-14 NOTE — Patient Instructions (Signed)
Visit Information  Thank you for taking time to visit with me today. Please don't hesitate to contact me if I can be of assistance to you.   Following are the goals we discussed today:   Goals Addressed             This Visit's Progress    RNCM: Management of chronic pain       Care Coordination Interventions: 07-14-2022: Patient rates her pain level at a 4 today on a scale of 0-10. The patient states it is in her left shoulder and hurts worse at night Reviewed provider established plan for pain management. 07-14-2022: The patient has not seen a specialist concerning her left shoulder pain but has went to [facility] for back pain that is now resolved. Will collaborate with the pcp for assistance if the patient needs a new referral for left shoulder pain. Education and support given Discussed importance of adherence to all scheduled medical appointments. The patient has an appointment this afternoon with her oncologist in [place]. She states she had blood work recently. Denies any acute needs at this time. Counseled on the importance of reporting any/all new or changed pain symptoms or management strategies to pain management provider Advised patient to report to care team affect of pain on daily activities Discussed use of relaxation techniques and/or diversional activities to assist with pain reduction (distraction, imagery, relaxation, massage, acupressure, TENS, heat, and cold application. 07-14-2022: Education on heat or ice therapy to the shoulder to see if that would help with her pain and discomfort. The patient stats mainly at night she is having issues. Education on applying heat or cold to the area for 20 minutes on and then 20 minutes off cycle. The patient verbalized understanding. Reviewed with patient prescribed pharmacological and nonpharmacological pain relief strategies Advised patient to discuss unresolved pain, changes in level or intensity of pain with provider Screening for signs  and symptoms of depression related to chronic disease state  Assessed social determinant of health barriers AWV has not been done yet for 2023. Email sent to the scheduler to get AWV scheduled. Reviewed with the patient.   Heat Therapy Heat therapy can help ease sore, stiff, injured, and tight muscles and joints. Heat relaxes your muscles. This may help ease your pain and muscle spasms. What are the risks? If you have any of the following conditions, do not use heat therapy unless your doctor says it is okay. These conditions include: New bruises. Open wounds. Any of these in the area being treated: Healing wounds. Infected skin. Scarred skin. Problems with how blood moves through your body (circulation). Loss of feeling (numbness) in the part of your body that is being treated. Unusual swelling of the part of the body that is being treated. Blood clots. Diabetes. Heart disease. Cancer. Not being able to communicate pain. This may include young children and people who have problems with their brain function (dementia). How to use heat therapy There are different kinds of heat therapy. These include: Moist heat pack. Hot water bottle. Electric heating pad. Heated gel pack. Heated wrap. Warm water bath. Your doctor will tell you how to use heat therapy. In general, you should: Place a towel between your skin and the heat source. Leave the heat on for 20-30 minutes. Your skin may turn pink. Take off the heat if your skin turns bright red. This is very important. If you cannot feel pain, heat, or cold, you have a greater risk of getting burned. Your  doctor may also tell you to take a warm water bath. To do this: Put a non-slip pad in the bathtub to prevent a fall. Fill the bathtub with warm water. Check the water temperature. Soak in the water for 15-20 minutes, or as told by your doctor. Be careful when you stand up after the bath. You may feel dizzy. Pat yourself dry after the  bath. Do not rub your skin to dry it. General recommendations for heat therapy Be careful not to burn your skin when using heat therapy. High heat or using heat for a long time can cause burns. Do not sleep while using heat therapy. Only use heat therapy while you are awake. Check your skin during heat therapy. Do not use heat therapy if you have a new injury, especially if you have swelling on the injured area. Do not use heat therapy on areas of your skin that are already irritated, such as with a rash or sunburn. Do not use heat therapy if your skin turns bright red. Contact a doctor if: You have blisters, redness, swelling, or loss of feeling in the area where you use heat therapy. You have new pain. You have pain that gets worse. Summary Heat therapy is the use of heat to help ease sore, stiff, injured, and tight muscles and joints. There are different types of heat therapy. Your doctor will tell you which one to use. Only use heat therapy while you are awake. Watch your skin to make sure you do not get burned while using heat therapy. This information is not intended to replace advice given to you by your health care provider. Make sure you discuss any questions you have with your health care provider. Document Revised: 09/23/2020 Document Reviewed: 09/23/2020 Elsevier Patient Education  [place] for Routine Care of Injuries Many injuries can be cared for with rest, ice, compression, and elevation (RICE therapy). This includes: Resting the injured body part. Putting ice on the injury. Putting pressure (compression) on the injury. Raising the injured part (elevation). Using RICE therapy can help to lessen pain and swelling. Supplies needed: Ice. Plastic bag. Towel. Elastic bandage. Pillow or pillows to raise your injured body part. How to care for your injury with RICE therapy Rest Try to rest the injured part of your body. You can go back to your  normal activities when your doctor says it is okay to do them and when you can do them without pain. If you rest the injury too much, it may not heal as well. Some injuries heal better with early movement instead of resting for too long. Ask your doctor if you should do exercises to help your injury get better. Ice  If told, put ice on the injured area. To do this: Put ice in a plastic bag. Place a towel between your skin and the bag. Leave the ice on for 20 minutes, 2-3 times a day. Take off the ice if your skin turns bright red. This is very important. If you cannot feel pain, heat, or cold, you have a greater risk of damage to the area. Do not put ice on your bare skin. Use ice for as many days as your doctor tells you to use it. Compression Put pressure on the injured area. This can be done with an elastic bandage. If this type of bandage has been put on your injury: Follow instructions on the package the bandage came in about how to use it.  Do not wrap the bandage too tightly. Wrap the bandage more loosely if part of your body beyond the bandage is blue, swollen, cold, painful, or loses feeling. Take off the bandage and put it on again every 3-4 hours or as told by your doctor. See your doctor if the bandage seems to make your problems worse.  Elevation Raise the injured area above the level of your heart while you are sitting or lying down. Follow these instructions at home: If your symptoms get worse or last a long time, make a follow-up appointment with your doctor. You may need to have imaging tests, such as X-rays or an MRI. If you have imaging tests, ask how to get your results when they are ready. Return to your normal activities when your doctor says that it is safe. Keep all follow-up visits. Contact a doctor if: You keep having pain and swelling. Your symptoms get worse. Get help right away if: You have sudden, very bad pain at your injury or lower than your injury. You  have redness or more swelling around your injury. You have tingling or numbness at your injury or lower than your injury, and it does not go away when you take off the bandage. Summary Many injuries can be cared for using rest, ice, compression, and elevation (RICE therapy). You can go back to your normal activities when your doctor says it is okay and when you can do them without pain. Put ice on the injured area as told by your doctor. Get help if your symptoms get worse or if you keep having pain and swelling. This information is not intended to replace advice given to you by your health care provider. Make sure you discuss any questions you have with your health care provider. Document Revised: 09/10/2020 Document Reviewed: 09/10/2020 Elsevier Patient Education  [place] next appointment is by telephone on 10-06-2022 at 0930 am  Please call the care guide team at [phone] if you need to cancel or reschedule your appointment.   If you are experiencing a Mental Health or [place] or need someone to talk to, please call the Suicide and Crisis Lifeline: 988 call the [place] National Suicide Prevention Lifeline: 773-492-8522 or TTY: 857-829-2662 TTY 903-584-8785) to talk to a trained counselor call 1-800-273-TALK (toll free, 24 hour hotline)  Patient verbalizes understanding of instructions and care plan provided today and agrees to view in [place]. Active MyChart status and patient understanding of how to access instructions and care plan via MyChart confirmed with patient.     Telephone follow up appointment with care management team member scheduled for: 10-06-2022 at [place] am  [place], MSN, [facility] Mobile: [phone]

## 2022-07-14 NOTE — Patient Outreach (Signed)
Care Coordination   Follow Up Visit Note   07/14/2022 Name: Cheryl Payne MRN: [id number] DOB: July 24, 1966  Cheryl Payne is a 56 y.o. year old female who sees [name], DO for primary care. I spoke with  Cheryl Payne by phone today  What matters to the patients health and wellness today?  Pain in her left shoulder    Goals Addressed             This Visit's Progress    RNCM: Management of chronic pain       Care Coordination Interventions: 07-14-2022: Patient rates her pain level at a 4 today on a scale of 0-10. The patient states it is in her left shoulder and hurts worse at night Reviewed provider established plan for pain management. 07-14-2022: The patient has not seen a specialist concerning her left shoulder pain but has went to [facility] for back pain that is now resolved. Will collaborate with the pcp for assistance if the patient needs a new referral for left shoulder pain. Education and support given Discussed importance of adherence to all scheduled medical appointments. The patient has an appointment this afternoon with her oncologist in [place]. She states she had blood work recently. Denies any acute needs at this time. Counseled on the importance of reporting any/all new or changed pain symptoms or management strategies to pain management provider Advised patient to report to care team affect of pain on daily activities Discussed use of relaxation techniques and/or diversional activities to assist with pain reduction (distraction, imagery, relaxation, massage, acupressure, TENS, heat, and cold application. 07-14-2022: Education on heat or ice therapy to the shoulder to see if that would help with her pain and discomfort. The patient stats mainly at night she is having issues. Education on applying heat or cold to the area for 20 minutes on and then 20 minutes off cycle. The patient verbalized understanding. Reviewed with patient prescribed pharmacological and  nonpharmacological pain relief strategies Advised patient to discuss unresolved pain, changes in level or intensity of pain with provider Screening for signs and symptoms of depression related to chronic disease state  Assessed social determinant of health barriers AWV has not been done yet for 2023. Email sent to the scheduler to get AWV scheduled. Reviewed with the patient.   Heat Therapy Heat therapy can help ease sore, stiff, injured, and tight muscles and joints. Heat relaxes your muscles. This may help ease your pain and muscle spasms. What are the risks? If you have any of the following conditions, do not use heat therapy unless your doctor says it is okay. These conditions include: New bruises. Open wounds. Any of these in the area being treated: Healing wounds. Infected skin. Scarred skin. Problems with how blood moves through your body (circulation). Loss of feeling (numbness) in the part of your body that is being treated. Unusual swelling of the part of the body that is being treated. Blood clots. Diabetes. Heart disease. Cancer. Not being able to communicate pain. This may include young children and people who have problems with their brain function (dementia). How to use heat therapy There are different kinds of heat therapy. These include: Moist heat pack. Hot water bottle. Electric heating pad. Heated gel pack. Heated wrap. Warm water bath. Your doctor will tell you how to use heat therapy. In general, you should: Place a towel between your skin and the heat source. Leave the heat on for 20-30 minutes. Your skin may turn pink. Take off the heat  if your skin turns bright red. This is very important. If you cannot feel pain, heat, or cold, you have a greater risk of getting burned. Your doctor may also tell you to take a warm water bath. To do this: Put a non-slip pad in the bathtub to prevent a fall. Fill the bathtub with warm water. Check the water  temperature. Soak in the water for 15-20 minutes, or as told by your doctor. Be careful when you stand up after the bath. You may feel dizzy. Pat yourself dry after the bath. Do not rub your skin to dry it. General recommendations for heat therapy Be careful not to burn your skin when using heat therapy. High heat or using heat for a long time can cause Cheryl. Do not sleep while using heat therapy. Only use heat therapy while you are awake. Check your skin during heat therapy. Do not use heat therapy if you have a new injury, especially if you have swelling on the injured area. Do not use heat therapy on areas of your skin that are already irritated, such as with a rash or sunburn. Do not use heat therapy if your skin turns bright red. Contact a doctor if: You have blisters, redness, swelling, or loss of feeling in the area where you use heat therapy. You have new pain. You have pain that gets worse. Summary Heat therapy is the use of heat to help ease sore, stiff, injured, and tight muscles and joints. There are different types of heat therapy. Your doctor will tell you which one to use. Only use heat therapy while you are awake. Watch your skin to make sure you do not get burned while using heat therapy. This information is not intended to replace advice given to you by your health care provider. Make sure you discuss any questions you have with your health care provider. Document Revised: 09/23/2020 Document Reviewed: 09/23/2020 Elsevier Patient Education  [place] for Routine Care of Injuries Many injuries can be cared for with rest, ice, compression, and elevation (RICE therapy). This includes: Resting the injured body part. Putting ice on the injury. Putting pressure (compression) on the injury. Raising the injured part (elevation). Using RICE therapy can help to lessen pain and swelling. Supplies needed: Ice. Plastic bag. Towel. Elastic  bandage. Pillow or pillows to raise your injured body part. How to care for your injury with RICE therapy Rest Try to rest the injured part of your body. You can go back to your normal activities when your doctor says it is okay to do them and when you can do them without pain. If you rest the injury too much, it may not heal as well. Some injuries heal better with early movement instead of resting for too long. Ask your doctor if you should do exercises to help your injury get better. Ice  If told, put ice on the injured area. To do this: Put ice in a plastic bag. Place a towel between your skin and the bag. Leave the ice on for 20 minutes, 2-3 times a day. Take off the ice if your skin turns bright red. This is very important. If you cannot feel pain, heat, or cold, you have a greater risk of damage to the area. Do not put ice on your bare skin. Use ice for as many days as your doctor tells you to use it. Compression Put pressure on the injured area. This can be done with an  elastic bandage. If this type of bandage has been put on your injury: Follow instructions on the package the bandage came in about how to use it. Do not wrap the bandage too tightly. Wrap the bandage more loosely if part of your body beyond the bandage is blue, swollen, cold, painful, or loses feeling. Take off the bandage and put it on again every 3-4 hours or as told by your doctor. See your doctor if the bandage seems to make your problems worse.  Elevation Raise the injured area above the level of your heart while you are sitting or lying down. Follow these instructions at home: If your symptoms get worse or last a long time, make a follow-up appointment with your doctor. You may need to have imaging tests, such as X-rays or an MRI. If you have imaging tests, ask how to get your results when they are ready. Return to your normal activities when your doctor says that it is safe. Keep all follow-up  visits. Contact a doctor if: You keep having pain and swelling. Your symptoms get worse. Get help right away if: You have sudden, very bad pain at your injury or lower than your injury. You have redness or more swelling around your injury. You have tingling or numbness at your injury or lower than your injury, and it does not go away when you take off the bandage. Summary Many injuries can be cared for using rest, ice, compression, and elevation (RICE therapy). You can go back to your normal activities when your doctor says it is okay and when you can do them without pain. Put ice on the injured area as told by your doctor. Get help if your symptoms get worse or if you keep having pain and swelling. This information is not intended to replace advice given to you by your health care provider. Make sure you discuss any questions you have with your health care provider. Document Revised: 09/10/2020 Document Reviewed: 09/10/2020 Elsevier Patient Education  [place] assessments and interventions completed:  Yes  SDOH Interventions Today    Flowsheet Row Most Recent Value  SDOH Interventions   Food Insecurity Interventions Intervention Not Indicated  Housing Interventions Intervention Not Indicated  Transportation Interventions Intervention Not Indicated        Care Coordination Interventions Activated:  Yes  Care Coordination Interventions:  Yes, provided   Follow up plan: Follow up call scheduled for 10-06-2022 at 0930 am    Encounter Outcome:  Pt. Visit Completed   Noreene Larsson RN, MSN, [place] [facility] Mobile: [phone]

## 2022-07-15 ENCOUNTER — Ambulatory Visit (INDEPENDENT_AMBULATORY_CARE_PROVIDER_SITE_OTHER): Payer: Medicare Other

## 2022-07-15 VITALS — Wt 209.0 lb

## 2022-07-15 DIAGNOSIS — Z0000 Encounter for general adult medical examination without abnormal findings: Secondary | ICD-10-CM

## 2022-07-15 NOTE — Progress Notes (Signed)
Virtual Visit via Telephone Note  I connected with  Cheryl Payne on 07/15/22 at  9:00 AM EDT by telephone and verified that I am speaking with the correct person using two identifiers.  Location: Patient: home Provider: [facility] Persons participating in the virtual visit: [place]   I discussed the limitations, risks, security and privacy concerns of performing an evaluation and management service by telephone and the availability of in person appointments. The patient expressed understanding and agreed to proceed.  Interactive audio and video telecommunications were attempted between this nurse and patient, however failed, due to patient having technical difficulties OR patient did not have access to video capability.  We continued and completed visit with audio only.  Some vital signs may be absent or patient reported.   Dionisio David, LPN  Subjective:   Cheryl Payne is a 56 y.o. female who presents for Medicare Annual (Subsequent) preventive examination.  Review of Systems           Objective:    There were no vitals filed for this visit. There is no height or weight on file to calculate BMI.     07/15/2022    9:09 AM 06/14/2021   10:58 AM  Advanced Directives  Does Patient Have a Medical Advance Directive? No No  Would patient like information on creating a medical advance directive? No - Patient declined No - Patient declined    Current Medications (verified) Outpatient Encounter Medications as of 07/15/2022  Medication Sig   amLODipine (NORVASC) 5 MG tablet TAKE 1 TABLET(5 MG) BY MOUTH DAILY   aspirin EC 81 MG tablet Take 81 mg by mouth daily. Swallow whole.   escitalopram (LEXAPRO) 10 MG tablet TAKE 1 TABLET(10 MG) BY MOUTH DAILY. START ONCE FINISHED CITALOPRAM 10 MG DOSAGE TAPER OFF   gabapentin (NEURONTIN) 300 MG capsule TAKE 1 CAPSULE BY MOUTH AT BEDTIME. MAY INCREASE TO 2 CAPSULES AT BEDTIME AS TOLERATED. ALSO. MAY TAKE 1 CAPSULE DURNING  DAY AS NEEDED   methocarbamol (ROBAXIN) 750 MG tablet    metoprolol tartrate (LOPRESSOR) 25 MG tablet TAKE 1 TABLET(25 MG) BY MOUTH TWICE DAILY   rosuvastatin (CRESTOR) 10 MG tablet TAKE 1 TABLET(10 MG) BY MOUTH DAILY   terbinafine (LAMISIL) 250 MG tablet Take 1 tablet (250 mg total) by mouth daily.   [DISCONTINUED] meloxicam (MOBIC) 15 MG tablet    meloxicam (MOBIC) 15 MG tablet TAKE 1 TABLET(15 MG) BY MOUTH DAILY AS NEEDED FOR PAIN (Patient not taking: Reported on 07/15/2022)   [DISCONTINUED] terbinafine (LAMISIL) 250 MG tablet Take 1 tablet (250 mg total) by mouth daily.   No facility-administered encounter medications on file as of 07/15/2022.    Allergies (verified) Patient has no known allergies.   History: Past Medical History:  Diagnosis Date   Cancer ([place])    lung   Chronic left shoulder pain    Past Surgical History:  Procedure Laterality Date   LUNG SURGERY     Family History  Problem Relation Age of Onset   Stomach cancer Mother 45   Breast cancer Mother    Lung cancer Father 76   Social History   Socioeconomic History   Marital status: Single    Spouse name: Not on file   Number of children: Not on file   Years of education: Not on file   Highest education level: Not on file  Occupational History   Not on file  Tobacco Use   Smoking status: Former    Packs/day: 1.00  Years: 35.00    Total pack years: 35.00    Types: Cigarettes   Smokeless tobacco: Former    Quit date: 06/04/2018  Substance and Sexual Activity   Alcohol use: Never   Drug use: Never   Sexual activity: Not on file  Other Topics Concern   Not on file  Social History Narrative   Not on file   Social Determinants of Health   Financial Resource Strain: Low Risk  (07/15/2022)   Overall Financial Resource Strain (CARDIA)    Difficulty of Paying Living Expenses: Not hard at all  Food Insecurity: No Food Insecurity (07/15/2022)   Hunger Vital Sign    Worried About Running Out of Food in  the Last Year: Never true    Ran Out of Food in the Last Year: Never true  Transportation Needs: No Transportation Needs (07/15/2022)   PRAPARE - Hydrologist (Medical): No    Lack of Transportation (Non-Medical): No  Physical Activity: Sufficiently Active (07/15/2022)   Exercise Vital Sign    Days of Exercise per Week: 3 days    Minutes of Exercise per Session: 60 min  Stress: No Stress Concern Present (07/15/2022)   [place]    Feeling of Stress : Not at all  Social Connections: Moderately Isolated (07/15/2022)   Social Connection and Isolation Panel [NHANES]    Frequency of Communication with Friends and Family: More than three times a week    Frequency of Social Gatherings with Friends and Family: Once a week    Attends Religious Services: More than 4 times per year    Active Member of [employer] or Organizations: No    Attends Music therapist: Never    Marital Status: Never married    Tobacco Counseling Counseling given: Not Answered   Clinical Intake:  Pre-visit preparation completed: Yes  Pain : No/denies pain     Nutritional Risks: None Diabetes: No  How often do you need to have someone help you when you read instructions, pamphlets, or other written materials from your doctor or pharmacy?: 1 - Never  Diabetic?no     Information entered by :: Kirke Shaggy, LPN   Activities of Daily Living    07/15/2022    9:10 AM  In your present state of health, do you have any difficulty performing the following activities:  Hearing? 0  Vision? 0  Difficulty concentrating or making decisions? 0  Walking or climbing stairs? 0  Dressing or bathing? 0  Doing errands, shopping? 0  Preparing Food and eating ? N  Using the Toilet? N  In the past six months, have you accidently leaked urine? N  Do you have problems with loss of bowel control? N  Managing your  Medications? N  Managing your Finances? N  Housekeeping or managing your Housekeeping? N    Patient Care Team: [name], DO as PCP - General (Family Medicine) Vanita Ingles, RN as Registered Nurse (General Practice)  Indicate any recent Medical Services you may have received from other than [facility] providers in the past year (date may be approximate).     Assessment:   This is a routine wellness examination for [place].  Hearing/Vision screen Hearing Screening - Comments:: No aids Vision Screening - Comments:: Wears glasses- [facility]  Dietary issues and exercise activities discussed: Current Exercise Habits: Home exercise routine, Type of exercise: walking, Time (Minutes): 60, Frequency (Times/Week): 3, Weekly  Exercise (Minutes/Week): 180, Intensity: Mild   Goals Addressed             This Visit's Progress    DIET - EAT MORE FRUITS AND VEGETABLES         Depression Screen    07/15/2022    9:07 AM 03/31/2022   10:06 AM 12/23/2021   10:17 AM 09/21/2021   10:42 AM 08/24/2021    8:03 AM 08/17/2021    8:29 AM 06/28/2021    3:10 PM  PHQ 2/9 Scores  PHQ - 2 Score 1 1 1 1 1  0 1  PHQ- 9 Score 2  4 4 5 4 7     Fall Risk    07/15/2022    9:09 AM 12/23/2021   10:17 AM 09/21/2021   10:42 AM 08/24/2021    8:01 AM 06/28/2021    3:09 PM  Fall Risk   Falls in the past year? 0 0 0 0 0  Number falls in past yr: 0 0 0 0 0  Injury with Fall? 0 0 0 0 0  Risk for fall due to : No Fall Risks No Fall Risks     Follow up Falls evaluation completed Falls evaluation completed Falls evaluation completed Falls evaluation completed Falls evaluation completed    [place]:  Any stairs in or around the home? No  If so, are there any without handrails? No  Home free of loose throw rugs in walkways, pet beds, electrical cords, etc? Yes  Adequate lighting in your home to reduce risk of falls? Yes   ASSISTIVE DEVICES UTILIZED TO  PREVENT FALLS:  Life alert? No  Use of a cane, walker or w/c? No  Grab bars in the bathroom? Yes  Shower chair or bench in shower? No  Elevated toilet seat or a handicapped toilet? No    Cognitive Function:        07/15/2022    9:11 AM  6CIT Screen  What Year? 0 points  What month? 0 points  What time? 0 points  Count back from 20 0 points  Months in reverse 0 points  Repeat phrase 0 points  Total Score 0 points    Immunizations Immunization History  Administered Date(s) Administered   Influenza,inj,Quad PF,6+ Mos 08/24/2021   Moderna Sars-Covid-2 Vaccination 03/09/2021   PFIZER(Purple Top)SARS-COV-2 Vaccination 02/21/2020, 03/13/2020, 09/21/2020    TDAP status: Due, Education has been provided regarding the importance of this vaccine. Advised may receive this vaccine at local pharmacy or Health Dept. Aware to provide a copy of the vaccination record if obtained from local pharmacy or Health Dept. Verbalized acceptance and understanding.  Flu Vaccine status: Up to date  Pneumococcal vaccine status: Declined,  Education has been provided regarding the importance of this vaccine but patient still declined. Advised may receive this vaccine at local pharmacy or Health Dept. Aware to provide a copy of the vaccination record if obtained from local pharmacy or Health Dept. Verbalized acceptance and understanding.   Covid-19 vaccine status: Completed vaccines  Qualifies for Shingles Vaccine? No   Zostavax completed No   Shingrix Completed?: No.    Education has been provided regarding the importance of this vaccine. Patient has been advised to call insurance company to determine out of pocket expense if they have not yet received this vaccine. Advised may also receive vaccine at local pharmacy or Health Dept. Verbalized acceptance and understanding.  Screening Tests Health Maintenance  Topic Date Due   TETANUS/TDAP  Never done   Zoster Vaccines- Shingrix (1 of 2) Never done    COVID-19 Vaccine (5 - Pfizer risk series) 05/04/2021   INFLUENZA VACCINE  07/05/2022   HIV Screening  02/11/2024 (Originally 05/21/1981)   PAP SMEAR-Modifier  07/30/2023   Fecal DNA (Cologuard)  08/07/2023   MAMMOGRAM  12/31/2023   Hepatitis C Screening  Completed   HPV VACCINES  Aged Out    Health Maintenance  Health Maintenance Due  Topic Date Due   TETANUS/TDAP  Never done   Zoster Vaccines- Shingrix (1 of 2) Never done   COVID-19 Vaccine (5 - Pfizer risk series) 05/04/2021   INFLUENZA VACCINE  07/05/2022    Colorectal cancer screening: Type of screening: Cologuard. Completed 08/06/20. Repeat every 3 years  Mammogram status: Completed 12/30/21. Repeat every year   Lung Cancer Screening: (Low Dose CT Chest recommended if Age 68-80 years, 30 pack-year currently smoking OR have quit w/in 15years.) does not qualify.    Additional Screening:  Hepatitis C Screening: does qualify; Completed 08/13/20  Vision Screening: Recommended annual ophthalmology exams for early detection of glaucoma and other disorders of the eye. Is the patient up to date with their annual eye exam?  Yes  Who is the provider or what is the name of the office in which the patient attends annual eye exams? [facility] If pt is not established with a provider, would they like to be referred to a provider to establish care? No .   Dental Screening: Recommended annual dental exams for proper oral hygiene  Community Resource Referral / Chronic Care Management: CRR required this visit?  No   CCM required this visit?  No      Plan:     I have personally reviewed and noted the following in the patient's chart:   Medical and social history Use of alcohol, tobacco or illicit drugs  Current medications and supplements including opioid prescriptions.  Functional ability and status Nutritional status Physical activity Advanced directives List of other physicians Hospitalizations, surgeries, and ER  visits in previous 12 months Vitals Screenings to include cognitive, depression, and falls Referrals and appointments  In addition, I have reviewed and discussed with patient certain preventive protocols, quality metrics, and best practice recommendations. A written personalized care plan for preventive services as well as general preventive health recommendations were provided to patient.     Dionisio David, LPN   [id number]   Nurse Notes: none

## 2022-07-15 NOTE — Patient Instructions (Signed)
Cheryl Payne , Thank you for taking time to come for your Medicare Wellness Visit. I appreciate your ongoing commitment to your health goals. Please review the following plan we discussed and let me know if I can assist you in the future.   Screening recommendations/referrals: Colonoscopy: Cologuard 08/06/20 Mammogram: 12/30/21 Bone Density: too young Recommended yearly ophthalmology/optometry visit for glaucoma screening and checkup Recommended yearly dental visit for hygiene and checkup  Vaccinations: Influenza vaccine: 08/24/21 Pneumococcal vaccine: n/d Tdap vaccine: n/d Shingles vaccine: n/d  Covid-19: 02/21/20, 03/13/20, 09/21/20, 03/09/21  Advanced directives: no  Conditions/risks identified: none  Next appointment: Follow up in one year for your annual wellness visit. 07/21/23@ 8:45 am by phone  Preventive Care 40-64 Years, Female Preventive care refers to lifestyle choices and visits with your health care provider that can promote health and wellness. What does preventive care include? A yearly physical exam. This is also called an annual well check. Dental exams once or twice a year. Routine eye exams. Ask your health care provider how often you should have your eyes checked. Personal lifestyle choices, including: Daily care of your teeth and gums. Regular physical activity. Eating a healthy diet. Avoiding tobacco and drug use. Limiting alcohol use. Practicing safe sex. Taking low-dose aspirin daily starting at age 41. Taking vitamin and mineral supplements as recommended by your health care provider. What happens during an annual well check? The services and screenings done by your health care provider during your annual well check will depend on your age, overall health, lifestyle risk factors, and family history of disease. Counseling  Your health care provider may ask you questions about your: Alcohol use. Tobacco use. Drug use. Emotional well-being. Home and  relationship well-being. Sexual activity. Eating habits. Work and work Statistician. Method of birth control. Menstrual cycle. Pregnancy history. Screening  You may have the following tests or measurements: Height, weight, and BMI. Blood pressure. Lipid and cholesterol levels. These may be checked every 5 years, or more frequently if you are over 80 years old. Skin check. Lung cancer screening. You may have this screening every year starting at age 19 if you have a 30-pack-year history of smoking and currently smoke or have quit within the past 15 years. Fecal occult blood test (FOBT) of the stool. You may have this test every year starting at age 45. Flexible sigmoidoscopy or colonoscopy. You may have a sigmoidoscopy every 5 years or a colonoscopy every 10 years starting at age 15. Hepatitis C blood test. Hepatitis B blood test. Sexually transmitted disease (STD) testing. Diabetes screening. This is done by checking your blood sugar (glucose) after you have not eaten for a while (fasting). You may have this done every 1-3 years. Mammogram. This may be done every 1-2 years. Talk to your health care provider about when you should start having regular mammograms. This may depend on whether you have a family history of breast cancer. BRCA-related cancer screening. This may be done if you have a family history of breast, ovarian, tubal, or peritoneal cancers. Pelvic exam and Pap test. This may be done every 3 years starting at age 75. Starting at age 47, this may be done every 5 years if you have a Pap test in combination with an HPV test. Bone density scan. This is done to screen for osteoporosis. You may have this scan if you are at high risk for osteoporosis. Discuss your test results, treatment options, and if necessary, the need for more tests with your health care  provider. Vaccines  Your health care provider may recommend certain vaccines, such as: Influenza vaccine. This is recommended  every year. Tetanus, diphtheria, and acellular pertussis (Tdap, Td) vaccine. You may need a Td booster every 10 years. Zoster vaccine. You may need this after age 54. Pneumococcal 13-valent conjugate (PCV13) vaccine. You may need this if you have certain conditions and were not previously vaccinated. Pneumococcal polysaccharide (PPSV23) vaccine. You may need one or two doses if you smoke cigarettes or if you have certain conditions. Talk to your health care provider about which screenings and vaccines you need and how often you need them. This information is not intended to replace advice given to you by your health care provider. Make sure you discuss any questions you have with your health care provider. Document Released: 12/18/2015 Document Revised: 08/10/2016 Document Reviewed: 09/22/2015 Elsevier Interactive Patient Education  2017 [place] Prevention in the Home Falls can cause injuries. They can happen to people of all ages. There are many things you can do to make your home safe and to help prevent falls. What can I do on the outside of my home? Regularly fix the edges of walkways and driveways and fix any cracks. Remove anything that might make you trip as you walk through a door, such as a raised step or threshold. Trim any bushes or trees on the path to your home. Use bright outdoor lighting. Clear any walking paths of anything that might make someone trip, such as rocks or tools. Regularly check to see if handrails are loose or broken. Make sure that both sides of any steps have handrails. Any raised decks and porches should have guardrails on the edges. Have any leaves, snow, or ice cleared regularly. Use sand or salt on walking paths during winter. Clean up any spills in your garage right away. This includes oil or grease spills. What can I do in the bathroom? Use night lights. Install grab bars by the toilet and in the tub and shower. Do not use towel bars as  grab bars. Use non-skid mats or decals in the tub or shower. If you need to sit down in the shower, use a plastic, non-slip stool. Keep the floor dry. Clean up any water that spills on the floor as soon as it happens. Remove soap buildup in the tub or shower regularly. Attach bath mats securely with double-sided non-slip rug tape. Do not have throw rugs and other things on the floor that can make you trip. What can I do in the bedroom? Use night lights. Make sure that you have a light by your bed that is easy to reach. Do not use any sheets or blankets that are too big for your bed. They should not hang down onto the floor. Have a firm chair that has side arms. You can use this for support while you get dressed. Do not have throw rugs and other things on the floor that can make you trip. What can I do in the kitchen? Clean up any spills right away. Avoid walking on wet floors. Keep items that you use a lot in easy-to-reach places. If you need to reach something above you, use a strong step stool that has a grab bar. Keep electrical cords out of the way. Do not use floor polish or wax that makes floors slippery. If you must use wax, use non-skid floor wax. Do not have throw rugs and other things on the floor that can make  you trip. What can I do with my stairs? Do not leave any items on the stairs. Make sure that there are handrails on both sides of the stairs and use them. Fix handrails that are broken or loose. Make sure that handrails are as long as the stairways. Check any carpeting to make sure that it is firmly attached to the stairs. Fix any carpet that is loose or worn. Avoid having throw rugs at the top or bottom of the stairs. If you do have throw rugs, attach them to the floor with carpet tape. Make sure that you have a light switch at the top of the stairs and the bottom of the stairs. If you do not have them, ask someone to add them for you. What else can I do to help prevent  falls? Wear shoes that: Do not have high heels. Have rubber bottoms. Are comfortable and fit you well. Are closed at the toe. Do not wear sandals. If you use a stepladder: Make sure that it is fully opened. Do not climb a closed stepladder. Make sure that both sides of the stepladder are locked into place. Ask someone to hold it for you, if possible. Clearly mark and make sure that you can see: Any grab bars or handrails. First and last steps. Where the edge of each step is. Use tools that help you move around (mobility aids) if they are needed. These include: Canes. Walkers. Scooters. Crutches. Turn on the lights when you go into a dark area. Replace any light bulbs as soon as they burn out. Set up your furniture so you have a clear path. Avoid moving your furniture around. If any of your floors are uneven, fix them. If there are any pets around you, be aware of where they are. Review your medicines with your doctor. Some medicines can make you feel dizzy. This can increase your chance of falling. Ask your doctor what other things that you can do to help prevent falls. This information is not intended to replace advice given to you by your health care provider. Make sure you discuss any questions you have with your health care provider. Document Released: 09/17/2009 Document Revised: 04/28/2016 Document Reviewed: 12/26/2014 Elsevier Interactive Patient Education  2017 [employer].

## 2022-07-17 ENCOUNTER — Other Ambulatory Visit: Payer: Self-pay | Admitting: Podiatry

## 2022-08-02 ENCOUNTER — Ambulatory Visit (INDEPENDENT_AMBULATORY_CARE_PROVIDER_SITE_OTHER): Payer: Medicare Other | Admitting: Podiatry

## 2022-08-02 ENCOUNTER — Ambulatory Visit: Payer: Self-pay

## 2022-08-02 ENCOUNTER — Encounter: Payer: Self-pay | Admitting: Podiatry

## 2022-08-02 DIAGNOSIS — B351 Tinea unguium: Secondary | ICD-10-CM

## 2022-08-02 DIAGNOSIS — Z79899 Other long term (current) drug therapy: Secondary | ICD-10-CM | POA: Diagnosis not present

## 2022-08-02 NOTE — Progress Notes (Signed)
  Subjective:  Patient ID: Cheryl Payne, female    DOB: 22-Mar-1966,  MRN: [id number]  Chief Complaint  Patient presents with   Nail Problem    Nail fungus follow up     56 y.o. female presents with the above complaint.  Patient presents with a follow-up of bilateral hallux onychomycosis.  She states he has finished 6 months total of Lamisil.  She is doing a lot better now for the fungus noted.  Denies any other acute complaints.   Review of Systems: Negative except as noted in the HPI. Denies N/V/F/Ch.  Past Medical History:  Diagnosis Date   Cancer ([place])    lung   Chronic left shoulder pain     Current Outpatient Medications:    amLODipine (NORVASC) 5 MG tablet, TAKE 1 TABLET(5 MG) BY MOUTH DAILY, Disp: 90 tablet, Rfl: 3   aspirin EC 81 MG tablet, Take 81 mg by mouth daily. Swallow whole., Disp: , Rfl:    escitalopram (LEXAPRO) 10 MG tablet, TAKE 1 TABLET(10 MG) BY MOUTH DAILY. START ONCE FINISHED CITALOPRAM 10 MG DOSAGE TAPER OFF, Disp: 30 tablet, Rfl: 5   gabapentin (NEURONTIN) 300 MG capsule, TAKE 1 CAPSULE BY MOUTH AT BEDTIME. MAY INCREASE TO 2 CAPSULES AT BEDTIME AS TOLERATED. ALSO. MAY TAKE 1 CAPSULE DURNING DAY AS NEEDED, Disp: 90 capsule, Rfl: 2   meloxicam (MOBIC) 15 MG tablet, TAKE 1 TABLET(15 MG) BY MOUTH DAILY AS NEEDED FOR PAIN (Patient not taking: Reported on 07/15/2022), Disp: 30 tablet, Rfl: 2   methocarbamol (ROBAXIN) 750 MG tablet, , Disp: , Rfl:    metoprolol tartrate (LOPRESSOR) 25 MG tablet, TAKE 1 TABLET(25 MG) BY MOUTH TWICE DAILY, Disp: 180 tablet, Rfl: 0   rosuvastatin (CRESTOR) 10 MG tablet, TAKE 1 TABLET(10 MG) BY MOUTH DAILY, Disp: 90 tablet, Rfl: 0  Social History   Tobacco Use  Smoking Status Former   Packs/day: 1.00   Years: 35.00   Total pack years: 35.00   Types: Cigarettes  Smokeless Tobacco Former   Quit date: 06/04/2018    No Known Allergies Objective:  There were no vitals filed for this visit. There is no height or weight on file to  calculate BMI. Constitutional Well developed. Well nourished.  Vascular Dorsalis pedis pulses palpable bilaterally. Posterior tibial pulses palpable bilaterally. Capillary refill normal to all digits.  No cyanosis or clubbing noted. Pedal hair growth normal.  Neurologic Normal speech. Oriented to person, place, and time. Epicritic sensation to light touch grossly present bilaterally.  Dermatologic No further fungal nail noted to bilateral toenails x10.  No signs of thickening noted Skin within normal limits  Orthopedic: Normal joint ROM without pain or crepitus bilaterally. No visible deformities. No bony tenderness.   Radiographs: None Assessment:   1. Long-term use of high-risk medication   2. Nail fungus   3. Onychomycosis due to dermatophyte     Plan:  Patient was evaluated and treated and all questions answered.  Bilateral hallux onychomycosis~improving and second round -[place] resolved.  I discuss prevention techniques and keeping an eye on the nail fungus.  If it looks like is regressing I advised her to come back and see me right away patient may need a pulsed dosing of Lamisil.  I discussed with patient she states understanding.   No follow-ups on file.

## 2022-08-02 NOTE — Patient Instructions (Signed)
Visit Information  Thank you for taking time to visit with me today. Please don't hesitate to contact me if I can be of assistance to you.   Following are the goals we discussed today:   Goals Addressed             This Visit's Progress    RNCM: Need help with paperwork       Care Coordination Interventions: Patient called to talk with RNCM about paperwork she needed signed from the pcp. Education and support given Advised patient to wait for Rachell to call the patient back and let her know if the paperwork she had sent to the office for the pcp to sign has come to the office fax.  Provided education to patient re: getting Dr. Parks Ranger to sign the paperwork and would get it faxed back to the patient at her apartment complex for expressed needs.  Collaborated with office staff regarding paperwork faxed to the [facility] attention that needed to be reviewed by Dr. Parks Ranger and signed. The paperwork has been received and Rachell will give to Dr. Parks Ranger to signing and return fax to the apartment complex Discussed plans with patient for ongoing care management follow up and provided patient with direct contact information for care management team Advised patient to discuss new concerns and changes with provider             Please call the care guide team at [phone] if you need to cancel or reschedule your appointment.   If you are experiencing a Mental Health or [place] or need someone to talk to, please call the Suicide and Crisis Lifeline: 988 call the [place] National Suicide Prevention Lifeline: 715-777-3963 or TTY: 607-231-6695 TTY 952-709-3811) to talk to a trained counselor call 1-800-273-TALK (toll free, 24 hour hotline)  Patient verbalizes understanding of instructions and care plan provided today and agrees to view in [place]. Active MyChart status and patient understanding of how to access instructions and care plan via MyChart confirmed with  patient.     Telephone follow up appointment with care management team member scheduled for:09-2022  Noreene Larsson RN, MSN, [facility] Mobile: [phone]

## 2022-08-02 NOTE — Patient Outreach (Signed)
  Care Coordination   Follow Up Visit Note   08/02/2022 Name: Cheryl Payne MRN: [id number] DOB: 27-Nov-1966  Cheryl Payne is a 56 y.o. year old female who sees [name], DO for primary care. I spoke with  [name] by phone today.  What matters to the patients health and wellness today?  Needed help with paperwork her apartment complex needs so she can have a sprayer installed to spray her fruits and vegetables. Needs to be signed by the pcp    Goals Addressed             This Visit's Progress    RNCM: Need help with paperwork       Care Coordination Interventions: Patient called to talk with RNCM about paperwork she needed signed from the pcp. Education and support given Advised patient to wait for Rachell to call the patient back and let her know if the paperwork she had sent to the office for the pcp to sign has come to the office fax.  Provided education to patient re: getting Dr. Parks Ranger to sign the paperwork and would get it faxed back to the patient at her apartment complex for expressed needs.  Collaborated with office staff regarding paperwork faxed to the [facility] attention that needed to be reviewed by Dr. Parks Ranger and signed. The paperwork has been received and Rachell will give to Dr. Parks Ranger to signing and return fax to the apartment complex Discussed plans with patient for ongoing care management follow up and provided patient with direct contact information for care management team Advised patient to discuss new concerns and changes with provider           SDOH assessments and interventions completed:  No     Care Coordination Interventions Activated:  Yes  Care Coordination Interventions:  Yes, provided   Follow up plan: Follow up call scheduled for 09-2022    Encounter Outcome:  Pt. Visit Completed   Noreene Larsson RN, MSN, [place] [facility] Mobile: [phone]

## 2022-08-03 ENCOUNTER — Other Ambulatory Visit: Payer: Self-pay | Admitting: Family Medicine

## 2022-08-03 DIAGNOSIS — F3341 Major depressive disorder, recurrent, in partial remission: Secondary | ICD-10-CM

## 2022-08-03 DIAGNOSIS — F411 Generalized anxiety disorder: Secondary | ICD-10-CM

## 2022-08-03 NOTE — Telephone Encounter (Signed)
Requested medication (s) are due for refill today:   Not sure.   It's being weaned off  Requested medication (s) are on the active medication list:   Yes  Future visit scheduled:   No   Last ordered: 02/17/2022 #30, 5 refills  Returned because this is being weaned off per protocol for provider review   Requested Prescriptions  Pending Prescriptions Disp Refills   escitalopram (LEXAPRO) 10 MG tablet [Pharmacy Med Name: ESCITALOPRAM 10MG  TABLETS] 30 tablet 5    Sig: TAKE 1 TABLET(10 MG) BY MOUTH DAILY. START 1 TIME FINISHED CITALOPRAM 10 MG DOSAGE TAPER OFF     Psychiatry:  Antidepressants - SSRI Passed - 08/03/2022  6:26 AM      Passed - Completed PHQ-2 or PHQ-9 in the last 360 days      Passed - Valid encounter within last 6 months    Recent Outpatient Visits           4 months ago Chronic left shoulder pain   [place], DO   7 months ago Vaginal discharge   [place], DO   10 months ago Chronic left-sided low back pain with left-sided sciatica   [place], DO   11 months ago Annual physical exam   [place], DO   11 months ago Chronic left-sided low back pain with left-sided sciatica   [facility] [place], Coralie Keens, NP

## 2022-08-16 ENCOUNTER — Encounter: Payer: Self-pay | Admitting: Radiology

## 2022-08-16 ENCOUNTER — Inpatient Hospital Stay
Admission: EM | Admit: 2022-08-16 | Discharge: 2022-08-18 | DRG: 378 | Disposition: A | Discharge status: Home / Self Care | Payer: Medicare Other | Attending: Hospitalist | Admitting: Hospitalist

## 2022-08-16 ENCOUNTER — Emergency Department: Payer: Medicare Other

## 2022-08-16 ENCOUNTER — Other Ambulatory Visit: Payer: Self-pay

## 2022-08-16 DIAGNOSIS — Z7982 Long term (current) use of aspirin: Secondary | ICD-10-CM | POA: Diagnosis not present

## 2022-08-16 DIAGNOSIS — Z79899 Other long term (current) drug therapy: Secondary | ICD-10-CM

## 2022-08-16 DIAGNOSIS — E669 Obesity, unspecified: Secondary | ICD-10-CM | POA: Diagnosis present

## 2022-08-16 DIAGNOSIS — Z801 Family history of malignant neoplasm of trachea, bronchus and lung: Secondary | ICD-10-CM

## 2022-08-16 DIAGNOSIS — T39395A Adverse effect of other nonsteroidal anti-inflammatory drugs [NSAID], initial encounter: Secondary | ICD-10-CM | POA: Diagnosis present

## 2022-08-16 DIAGNOSIS — C3490 Malignant neoplasm of unspecified part of unspecified bronchus or lung: Secondary | ICD-10-CM | POA: Diagnosis present

## 2022-08-16 DIAGNOSIS — F3341 Major depressive disorder, recurrent, in partial remission: Secondary | ICD-10-CM | POA: Diagnosis present

## 2022-08-16 DIAGNOSIS — I959 Hypotension, unspecified: Secondary | ICD-10-CM | POA: Diagnosis present

## 2022-08-16 DIAGNOSIS — K920 Hematemesis: Secondary | ICD-10-CM | POA: Diagnosis not present

## 2022-08-16 DIAGNOSIS — I774 Celiac artery compression syndrome: Secondary | ICD-10-CM | POA: Diagnosis not present

## 2022-08-16 DIAGNOSIS — D72829 Elevated white blood cell count, unspecified: Secondary | ICD-10-CM | POA: Diagnosis present

## 2022-08-16 DIAGNOSIS — Z800 Family history of malignant neoplasm of digestive organs: Secondary | ICD-10-CM | POA: Diagnosis not present

## 2022-08-16 DIAGNOSIS — K3189 Other diseases of stomach and duodenum: Secondary | ICD-10-CM | POA: Diagnosis present

## 2022-08-16 DIAGNOSIS — D62 Acute posthemorrhagic anemia: Secondary | ICD-10-CM | POA: Diagnosis not present

## 2022-08-16 DIAGNOSIS — R54 Age-related physical debility: Secondary | ICD-10-CM | POA: Diagnosis present

## 2022-08-16 DIAGNOSIS — F411 Generalized anxiety disorder: Secondary | ICD-10-CM | POA: Diagnosis present

## 2022-08-16 DIAGNOSIS — R1110 Vomiting, unspecified: Secondary | ICD-10-CM | POA: Diagnosis not present

## 2022-08-16 DIAGNOSIS — J9811 Atelectasis: Secondary | ICD-10-CM | POA: Diagnosis not present

## 2022-08-16 DIAGNOSIS — E860 Dehydration: Secondary | ICD-10-CM | POA: Diagnosis not present

## 2022-08-16 DIAGNOSIS — K2970 Gastritis, unspecified, without bleeding: Secondary | ICD-10-CM | POA: Diagnosis not present

## 2022-08-16 DIAGNOSIS — Z6838 Body mass index (BMI) 38.0-38.9, adult: Secondary | ICD-10-CM

## 2022-08-16 DIAGNOSIS — G8929 Other chronic pain: Secondary | ICD-10-CM | POA: Diagnosis not present

## 2022-08-16 DIAGNOSIS — Z803 Family history of malignant neoplasm of breast: Secondary | ICD-10-CM

## 2022-08-16 DIAGNOSIS — E7800 Pure hypercholesterolemia, unspecified: Secondary | ICD-10-CM | POA: Diagnosis present

## 2022-08-16 DIAGNOSIS — K254 Chronic or unspecified gastric ulcer with hemorrhage: Principal | ICD-10-CM | POA: Diagnosis present

## 2022-08-16 DIAGNOSIS — K259 Gastric ulcer, unspecified as acute or chronic, without hemorrhage or perforation: Secondary | ICD-10-CM | POA: Diagnosis not present

## 2022-08-16 DIAGNOSIS — M25512 Pain in left shoulder: Secondary | ICD-10-CM | POA: Diagnosis not present

## 2022-08-16 DIAGNOSIS — R61 Generalized hyperhidrosis: Secondary | ICD-10-CM | POA: Diagnosis not present

## 2022-08-16 DIAGNOSIS — K219 Gastro-esophageal reflux disease without esophagitis: Secondary | ICD-10-CM | POA: Diagnosis not present

## 2022-08-16 DIAGNOSIS — I10 Essential (primary) hypertension: Secondary | ICD-10-CM | POA: Diagnosis present

## 2022-08-16 DIAGNOSIS — I700 Atherosclerosis of aorta: Secondary | ICD-10-CM | POA: Diagnosis not present

## 2022-08-16 DIAGNOSIS — K922 Gastrointestinal hemorrhage, unspecified: Secondary | ICD-10-CM | POA: Diagnosis not present

## 2022-08-16 DIAGNOSIS — N179 Acute kidney failure, unspecified: Secondary | ICD-10-CM | POA: Diagnosis present

## 2022-08-16 DIAGNOSIS — I701 Atherosclerosis of renal artery: Secondary | ICD-10-CM | POA: Diagnosis not present

## 2022-08-16 DIAGNOSIS — Z87891 Personal history of nicotine dependence: Secondary | ICD-10-CM | POA: Diagnosis not present

## 2022-08-16 DIAGNOSIS — K2990 Gastroduodenitis, unspecified, without bleeding: Secondary | ICD-10-CM | POA: Diagnosis not present

## 2022-08-16 DIAGNOSIS — K2289 Other specified disease of esophagus: Secondary | ICD-10-CM | POA: Diagnosis not present

## 2022-08-16 DIAGNOSIS — Z8719 Personal history of other diseases of the digestive system: Secondary | ICD-10-CM | POA: Diagnosis not present

## 2022-08-16 LAB — CBC WITH DIFFERENTIAL/PLATELET
Abs Immature Granulocytes: 0.22 10*3/uL — ABNORMAL HIGH (ref 0.00–0.07)
Basophils Absolute: 0.1 10*3/uL (ref 0.0–0.1)
Basophils Relative: 0 %
Eosinophils Absolute: 0 10*3/uL (ref 0.0–0.5)
Eosinophils Relative: 0 %
HCT: 28.9 % — ABNORMAL LOW (ref 36.0–46.0)
Hemoglobin: 9.3 g/dL — ABNORMAL LOW (ref 12.0–15.0)
Immature Granulocytes: 1 %
Lymphocytes Relative: 26 %
Lymphs Abs: 4 10*3/uL (ref 0.7–4.0)
MCH: 30.7 pg (ref 26.0–34.0)
MCHC: 32.2 g/dL (ref 30.0–36.0)
MCV: 95.4 fL (ref 80.0–100.0)
Monocytes Absolute: 1.4 10*3/uL — ABNORMAL HIGH (ref 0.1–1.0)
Monocytes Relative: 9 %
Neutro Abs: 9.9 10*3/uL — ABNORMAL HIGH (ref 1.7–7.7)
Neutrophils Relative %: 64 %
Platelets: 224 10*3/uL (ref 150–400)
RBC: 3.03 MIL/uL — ABNORMAL LOW (ref 3.87–5.11)
RDW: 16.9 % — ABNORMAL HIGH (ref 11.5–15.5)
WBC: 15.5 10*3/uL — ABNORMAL HIGH (ref 4.0–10.5)
nRBC: 1.9 % — ABNORMAL HIGH (ref 0.0–0.2)

## 2022-08-16 LAB — COMPREHENSIVE METABOLIC PANEL
ALT: 11 U/L (ref 0–44)
AST: 16 U/L (ref 15–41)
Albumin: 3.5 g/dL (ref 3.5–5.0)
Alkaline Phosphatase: 61 U/L (ref 38–126)
Anion gap: 8 (ref 5–15)
BUN: 69 mg/dL — ABNORMAL HIGH (ref 6–20)
CO2: 24 mmol/L (ref 22–32)
Calcium: 8.7 mg/dL — ABNORMAL LOW (ref 8.9–10.3)
Chloride: 107 mmol/L (ref 98–111)
Creatinine, Ser: 1.02 mg/dL — ABNORMAL HIGH (ref 0.44–1.00)
GFR, Estimated: >60 mL/min (ref >60)
Glucose, Bld: 157 mg/dL — ABNORMAL HIGH (ref 70–99)
Potassium: 3.9 mmol/L (ref 3.5–5.1)
Sodium: 139 mmol/L (ref 135–145)
Total Bilirubin: 0.6 mg/dL (ref 0.3–1.2)
Total Protein: 6.4 g/dL — ABNORMAL LOW (ref 6.5–8.1)

## 2022-08-16 LAB — TROPONIN I (HIGH SENSITIVITY)
Troponin I (High Sensitivity): 29 ng/L — ABNORMAL HIGH (ref <18)
Troponin I (High Sensitivity): 39 ng/L — ABNORMAL HIGH (ref <18)

## 2022-08-16 LAB — LIPASE, BLOOD: Lipase: 24 U/L (ref 11–51)

## 2022-08-16 LAB — LACTIC ACID, PLASMA
Lactic Acid, Venous: 3.4 mmol/L (ref 0.5–1.9)
Lactic Acid, Venous: 2.1 mmol/L (ref 0.5–1.9)

## 2022-08-16 LAB — ABO/RH: ABO/RH(D): B POS

## 2022-08-16 LAB — PREPARE RBC (CROSSMATCH)

## 2022-08-16 MED ORDER — PANTOPRAZOLE SODIUM 40 MG IV SOLR
40.0000 mg | Freq: Once | INTRAVENOUS | Status: AC
  Administered 2022-08-16: 40 mg via INTRAVENOUS
  Filled 2022-08-16: qty 10

## 2022-08-16 MED ORDER — PIPERACILLIN-TAZOBACTAM 3.375 G IVPB
3.3750 g | Freq: Once | INTRAVENOUS | Status: AC
  Administered 2022-08-16: 3.375 g via INTRAVENOUS
  Filled 2022-08-16: qty 50

## 2022-08-16 MED ORDER — ROSUVASTATIN CALCIUM 10 MG PO TABS
10.0000 mg | ORAL_TABLET | Freq: Every day | ORAL | Status: DC
  Administered 2022-08-17: 10 mg via ORAL
  Filled 2022-08-16: qty 1

## 2022-08-16 MED ORDER — ONDANSETRON HCL 4 MG/2ML IJ SOLN
4.0000 mg | Freq: Once | INTRAMUSCULAR | Status: AC
  Administered 2022-08-16: 4 mg via INTRAVENOUS
  Filled 2022-08-16: qty 2

## 2022-08-16 MED ORDER — SODIUM CHLORIDE 0.9 % IV SOLN: 10.0000 mL/h | Freq: Once | INTRAVENOUS | Status: DC

## 2022-08-16 MED ORDER — GABAPENTIN 300 MG PO CAPS
600.0000 mg | ORAL_CAPSULE | Freq: Every day | ORAL | Status: DC
  Administered 2022-08-16 – 2022-08-17 (×2): 600 mg via ORAL
  Filled 2022-08-16 (×2): qty 2

## 2022-08-16 MED ORDER — ONDANSETRON HCL 4 MG/2ML IJ SOLN: 4.0000 mg | Freq: Four times a day (QID) | INTRAMUSCULAR | Status: DC | PRN

## 2022-08-16 MED ORDER — SENNOSIDES-DOCUSATE SODIUM 8.6-50 MG PO TABS: 1.0000 | ORAL_TABLET | Freq: Every evening | ORAL | Status: DC | PRN

## 2022-08-16 MED ORDER — SODIUM CHLORIDE 0.9 % IV BOLUS
1000.0000 mL | Freq: Once | INTRAVENOUS | Status: AC
  Administered 2022-08-16: 1000 mL via INTRAVENOUS

## 2022-08-16 MED ORDER — ONDANSETRON HCL 4 MG PO TABS: 4.0000 mg | ORAL_TABLET | Freq: Four times a day (QID) | ORAL | Status: DC | PRN

## 2022-08-16 MED ORDER — PANTOPRAZOLE SODIUM 40 MG IV SOLR: 40.0000 mg | Freq: Two times a day (BID) | INTRAVENOUS | Status: DC

## 2022-08-16 MED ORDER — GABAPENTIN 300 MG PO CAPS
300.0000 mg | ORAL_CAPSULE | Freq: Every day | ORAL | Status: DC | PRN
  Administered 2022-08-18 (×2): 300 mg via ORAL
  Filled 2022-08-16 (×2): qty 1

## 2022-08-16 MED ORDER — ACETAMINOPHEN 325 MG PO TABS
650.0000 mg | ORAL_TABLET | Freq: Four times a day (QID) | ORAL | Status: DC | PRN
  Administered 2022-08-16 – 2022-08-17 (×3): 650 mg via ORAL
  Filled 2022-08-16 (×3): qty 2

## 2022-08-16 MED ORDER — PANTOPRAZOLE INFUSION (NEW) - SIMPLE MED
8.0000 mg/h | INTRAVENOUS | Status: DC
  Administered 2022-08-16 – 2022-08-18 (×5): 8 mg/h via INTRAVENOUS
  Filled 2022-08-16 (×7): qty 100

## 2022-08-16 MED ORDER — ESCITALOPRAM OXALATE 10 MG PO TABS
10.0000 mg | ORAL_TABLET | Freq: Every day | ORAL | Status: DC
  Administered 2022-08-16 – 2022-08-17 (×2): 10 mg via ORAL
  Filled 2022-08-16 (×2): qty 1

## 2022-08-16 MED ORDER — IOHEXOL 350 MG/ML SOLN
100.0000 mL | Freq: Once | INTRAVENOUS | Status: AC | PRN
  Administered 2022-08-16: 100 mL via INTRAVENOUS

## 2022-08-16 MED ORDER — ACETAMINOPHEN 650 MG RE SUPP: 650.0000 mg | Freq: Four times a day (QID) | RECTAL | Status: DC | PRN

## 2022-08-16 MED ORDER — HYDRALAZINE HCL 20 MG/ML IJ SOLN: 5.0000 mg | Freq: Three times a day (TID) | INTRAMUSCULAR | Status: DC | PRN

## 2022-08-16 NOTE — ED Provider Notes (Signed)
[facility] Provider Note    Event Date/Time   First MD Initiated Contact with Patient 08/16/22 1015     (approximate)   History   Hematemesis   HPI  Cheryl Payne is a 56 y.o. female with history of obesity, generalized anxiety, hyperlipidemia, here with severe abdominal pain, nausea, vomiting.  The patient states that starting yesterday, she developed severe, epigastric, abdominal pain with associated right red blood emesis as well as dark, melanotic stools.  She has had ongoing, severe pain since then.  She has felt lightheaded.  She has been essentially vomiting black/coffee-ground emesis all night.  Denies known history of ulcers.  She states that she does not really take aspirin, NSAIDs, or other medications.  Denies any regular alcohol use.  She is on gabapentin.  No anticoagulant use.     Physical Exam   Triage Vital Signs: ED Triage Vitals  Enc Vitals Group     BP 08/16/22 1011 (!) 89/61     Pulse Rate 08/16/22 1011 (!) 106     Resp 08/16/22 1011 17     Temp 08/16/22 1011 98.1 F (36.7 C)     Temp Source 08/16/22 1011 Oral     SpO2 08/16/22 1011 97 %     Weight 08/16/22 1012 210 lb (95.3 kg)     Height 08/16/22 1012 5\' 2"  (1.575 m)     Head Circumference --      Peak Flow --      Pain Score 08/16/22 1012 9     Pain Loc --      Pain Edu? --      Excl. in [place]? --     Most recent vital signs: Vitals:   08/16/22 1525 08/16/22 1601  BP: 102/61 93/62  Pulse: 86 83  Resp: 18 18  Temp: 98.2 F (36.8 C) 98.3 F (36.8 C)  SpO2: 96% 98%     General: Awake, no distress.  CV:  Good peripheral perfusion.  Tachycardic. Resp:  Normal effort.  Abd:  No distention.  Moderate epigastric tenderness.  No rebound or guarding.  No rigidity.  No peritoneal signs. Other:  Slight pallor noted.   ED Results / Procedures / Treatments   Labs (all labs ordered are listed, but only abnormal results are displayed) Labs Reviewed  CBC WITH  DIFFERENTIAL/PLATELET - Abnormal; Notable for the following components:      Result Value   WBC 15.5 (*)    RBC 3.03 (*)    Hemoglobin 9.3 (*)    HCT 28.9 (*)    RDW 16.9 (*)    nRBC 1.9 (*)    Neutro Abs 9.9 (*)    Monocytes Absolute 1.4 (*)    Abs Immature Granulocytes 0.22 (*)    All other components within normal limits  COMPREHENSIVE METABOLIC PANEL - Abnormal; Notable for the following components:   Glucose, Bld 157 (*)    BUN 69 (*)    Creatinine, Ser 1.02 (*)    Calcium 8.7 (*)    Total Protein 6.4 (*)    All other components within normal limits  LACTIC ACID, PLASMA - Abnormal; Notable for the following components:   Lactic Acid, Venous 3.4 (*)    All other components within normal limits  LACTIC ACID, PLASMA - Abnormal; Notable for the following components:   Lactic Acid, Venous 2.1 (*)    All other components within normal limits  TROPONIN I (HIGH SENSITIVITY) - Abnormal; Notable for the following  components:   Troponin I (High Sensitivity) 29 (*)    All other components within normal limits  TROPONIN I (HIGH SENSITIVITY) - Abnormal; Notable for the following components:   Troponin I (High Sensitivity) 39 (*)    All other components within normal limits  LIPASE, BLOOD  URINALYSIS, ROUTINE W REFLEX MICROSCOPIC  HIV ANTIBODY (ROUTINE TESTING W REFLEX)  BASIC METABOLIC PANEL  CBC  POC URINE PREG, ED  TYPE AND SCREEN  PREPARE RBC (CROSSMATCH)  ABO/RH     EKG Normal sinus rhythm, trickle rate 93.  PR 171, QRS 77, QTc 4 6.  No acute ST elevations or depressions.  No EKG evidence of acute ischemia or infarct.   RADIOLOGY Chest x-ray: Bibasilar atelectasis/scarring CT angio bleed: No acute bleeding or abnormality, moderate stenosis of proximal celiac artery   I also independently reviewed and agree with radiologist interpretations.   PROCEDURES:  Critical Care performed: Yes, see critical care procedure note(s)  .Critical Care  Performed by: Duffy Bruce, MD Authorized by: Duffy Bruce, MD   Critical care provider statement:    Critical care time (minutes):  30   Critical care was necessary to treat or prevent imminent or life-threatening deterioration of the following conditions:  Cardiac failure, circulatory failure and respiratory failure   Critical care was time spent personally by me on the following activities:  Development of treatment plan with patient or surrogate, discussions with consultants, evaluation of patient's response to treatment, examination of patient, ordering and review of laboratory studies, ordering and review of radiographic studies, ordering and performing treatments and interventions, pulse oximetry, re-evaluation of patient's condition and review of old charts     MEDICATIONS ORDERED IN ED: Medications  pantoprozole (PROTONIX) 80 mg /NS 100 mL infusion (8 mg/hr Intravenous New Bag/Given 08/16/22 1059)  pantoprazole (PROTONIX) injection 40 mg (has no administration in time range)  0.9 %  sodium chloride infusion (0 mL/hr Intravenous Hold 08/16/22 1130)  rosuvastatin (CRESTOR) tablet 10 mg (has no administration in time range)  escitalopram (LEXAPRO) tablet 10 mg (has no administration in time range)  gabapentin (NEURONTIN) capsule 600 mg (has no administration in time range)  acetaminophen (TYLENOL) tablet 650 mg (has no administration in time range)    Or  acetaminophen (TYLENOL) suppository 650 mg (has no administration in time range)  ondansetron (ZOFRAN) tablet 4 mg (has no administration in time range)    Or  ondansetron (ZOFRAN) injection 4 mg (has no administration in time range)  senna-docusate (Senokot-S) tablet 1 tablet (has no administration in time range)  hydrALAZINE (APRESOLINE) injection 5 mg (has no administration in time range)  gabapentin (NEURONTIN) capsule 300 mg (has no administration in time range)  sodium chloride 0.9 % bolus 1,000 mL (0 mLs Intravenous Stopped 08/16/22 1130)   sodium chloride 0.9 % bolus 1,000 mL (0 mLs Intravenous Stopped 08/16/22 1130)  pantoprazole (PROTONIX) injection 40 mg (40 mg Intravenous Given 08/16/22 1050)  ondansetron (ZOFRAN) injection 4 mg (4 mg Intravenous Given 08/16/22 1049)  pantoprazole (PROTONIX) injection 40 mg (40 mg Intravenous Given 08/16/22 1055)  piperacillin-tazobactam (ZOSYN) IVPB 3.375 g (0 g Intravenous Stopped 08/16/22 1243)  iohexol (OMNIPAQUE) 350 MG/ML injection 100 mL (100 mLs Intravenous Contrast Given 08/16/22 1134)     IMPRESSION / MDM / [place] / ED COURSE  I reviewed the triage vital signs and the nursing notes.  The patient is on the cardiac monitor to evaluate for evidence of arrhythmia and/or significant heart rate changes.   Ddx:  Differential includes the following, with pertinent life- or limb-threatening emergencies considered:  PUD, gastritis, MWT, duodenal ulcer, obstruction, mesenteric ischemia, gastroenteritis  Patient's presentation is most consistent with acute presentation with potential threat to life or bodily function.  MDM:  56 yo F here with nausea, vomiting, hematemesis. Pt hypotensive, tachycardic on arrival. PIV x 2 placed and IVF started. Hgb 9.3, down from 13.0 at last check c/w acute bleed. CMP c/w UGIB with BUN 69, Cr 1.02. Trop mildly elevated likely 2/2 demand. EKG nonischemic. CT ANgio bleed obtained 2/2 degree of bleeding, shows no active hemorrhage though clinically concern for this. Pt remained hypotensive after fluids so 1L pRBC ordered empirically. Dr. Vicente Males with GI aware and will see pt. IV Protonix bolus and drip given. Zosyn given initially in setting of hypotension and lactic acidosis.   MEDICATIONS GIVEN IN ED: Medications  pantoprozole (PROTONIX) 80 mg /NS 100 mL infusion (8 mg/hr Intravenous New Bag/Given 08/16/22 1059)  pantoprazole (PROTONIX) injection 40 mg (has no administration in time range)  0.9 %  sodium chloride  infusion (0 mL/hr Intravenous Hold 08/16/22 1130)  rosuvastatin (CRESTOR) tablet 10 mg (has no administration in time range)  escitalopram (LEXAPRO) tablet 10 mg (has no administration in time range)  gabapentin (NEURONTIN) capsule 600 mg (has no administration in time range)  acetaminophen (TYLENOL) tablet 650 mg (has no administration in time range)    Or  acetaminophen (TYLENOL) suppository 650 mg (has no administration in time range)  ondansetron (ZOFRAN) tablet 4 mg (has no administration in time range)    Or  ondansetron (ZOFRAN) injection 4 mg (has no administration in time range)  senna-docusate (Senokot-S) tablet 1 tablet (has no administration in time range)  hydrALAZINE (APRESOLINE) injection 5 mg (has no administration in time range)  gabapentin (NEURONTIN) capsule 300 mg (has no administration in time range)  sodium chloride 0.9 % bolus 1,000 mL (0 mLs Intravenous Stopped 08/16/22 1130)  sodium chloride 0.9 % bolus 1,000 mL (0 mLs Intravenous Stopped 08/16/22 1130)  pantoprazole (PROTONIX) injection 40 mg (40 mg Intravenous Given 08/16/22 1050)  ondansetron (ZOFRAN) injection 4 mg (4 mg Intravenous Given 08/16/22 1049)  pantoprazole (PROTONIX) injection 40 mg (40 mg Intravenous Given 08/16/22 1055)  piperacillin-tazobactam (ZOSYN) IVPB 3.375 g (0 g Intravenous Stopped 08/16/22 1243)  iohexol (OMNIPAQUE) 350 MG/ML injection 100 mL (100 mLs Intravenous Contrast Given 08/16/22 1134)     Consults:  GI Dr. Vicente Males Hospitalist   EMR reviewed       FINAL CLINICAL IMPRESSION(S) / ED DIAGNOSES   Final diagnoses:  Upper GI bleed  Acute blood loss anemia     Rx / DC Orders   ED Discharge Orders     None        Note:  This document was prepared using Dragon voice recognition software and may include unintentional dictation errors.   Duffy Bruce, MD 08/16/22 1910

## 2022-08-16 NOTE — Assessment & Plan Note (Signed)
-   Presumed reactive, no clinical symptoms of infectious etiology at this time - Procalcitonin has been ordered - Patient is status post piperacillin-tazobactam - Aspiration caution - CBC in the a.m.

## 2022-08-16 NOTE — Assessment & Plan Note (Signed)
-   Patient's BMI is greater than 35, and patient has hypertension. This complicates overall care and prognosis.

## 2022-08-16 NOTE — Assessment & Plan Note (Signed)
-   Patient does not have risk factors for upper GI bleed - GI has been consulted for endoscopy, Dr. Vicente Males is aware of patient - I have not ordered H. pylori evaluation and/or testing due to Protonix have been initiated - Continue Protonix gtt. - Status post 1 unit PRBC have been ordered for transfusion - Maintain bilateral upper extremity PIV (preferred large-bore) - Admit to progressive, observation

## 2022-08-16 NOTE — ED Notes (Signed)
Notified primary nurse and EDP of pt's critical lactic acid 3.4

## 2022-08-16 NOTE — ED Notes (Signed)
Pure wick placed.

## 2022-08-16 NOTE — Plan of Care (Signed)

## 2022-08-16 NOTE — Hospital Course (Signed)
Cheryl Payne is a 56 year old female with history of obesity, hypertension, hyperlipidemia, depression, anxiety, who presents emergency department for chief concerns of hematemesis on 08/15/2022.  Initial vitals in the ED showed temperature of 98.1, respiration rate of 23, heart rate of 88, initial blood pressure 92/61, SPO2 99% on room air.  Serum sodium is 139, potassium 3.9, chloride 107, bicarb 24, BUN 69, serum creatinine 1.02, nonfasting blood glucose 157, GFR greater than 60, WBC 15.5, hemoglobin 9.3, platelets of 224.  High since he troponin was 23, lactic acid was elevated at 3.4.  EDP consulted GI provider who states he will see the patient. ED treatment: Ondansetron 4 mg IV, Protonix bolus and gtt., sodium chloride 1 L bolus x2.  1 unit PRBC have been ordered for transfusion

## 2022-08-16 NOTE — ED Triage Notes (Signed)
Pt here with hematemesis since yesterday. Pt also having abd pain and is diaphoretic on arrival to ED.

## 2022-08-16 NOTE — Consult Note (Signed)
Jonathon Bellows , MD [address], [place], [place], [place], [address] [address] [place], [place], [place], [address] Phone: [phone]  Fax: [phone]  Consultation  Referring Provider:   Dr. Ellender Hose Primary Care Physician:  [name], DO Primary Gastroenterologist: None         Reason for Consultation:     GI bleed  Date of Admission:  08/16/2022 Date of Consultation:  08/16/2022         HPI:   Cheryl Payne is a 56 y.o. female presented to the emergency room with a history of hematemesis.  She states that yesterday all of a sudden she had a sense of nausea and threw up bright red blood.  She had no further episodes of hematemesis then she had 2 bowel movements which were black in nature last 1 was in the evening when I went to see her.  He denies any abdominal pain.  She denies any Motrin Aleve naproxen but has been taking meloxicam on a regular basis for joint pains which she states she last took a few weeks back.  No other complaints presently.   08/16/2022 hemoglobin 9.3 g BUN of 69 creatinine of 1.02.  LFTs normal.  Lipase normal.  Hemoglobin 4 weeks back was 14 g.  No recent colonoscopy report on epic.   Past Medical History:  Diagnosis Date   Cancer ([place])    lung   Chronic left shoulder pain     Past Surgical History:  Procedure Laterality Date   LUNG SURGERY      Prior to Admission medications   Medication Sig Start Date End Date Taking? Authorizing Provider  amLODipine (NORVASC) 5 MG tablet TAKE 1 TABLET(5 MG) BY MOUTH DAILY 02/17/22  Yes Karamalegos, Devonne Doughty, DO  aspirin EC 81 MG tablet Take 81 mg by mouth daily. Swallow whole.   Yes [provider]  escitalopram (LEXAPRO) 10 MG tablet Take 1 tablet (10 mg total) by mouth daily. Patient taking differently: Take 10 mg by mouth at bedtime. 08/04/22  Yes Karamalegos, Devonne Doughty, DO  gabapentin (NEURONTIN) 300 MG capsule TAKE 1 CAPSULE BY MOUTH AT BEDTIME. MAY INCREASE TO 2  CAPSULES AT BEDTIME AS TOLERATED. ALSO. MAY TAKE 1 CAPSULE DURNING DAY AS NEEDED Patient taking differently: Take 300-600 mg by mouth See admin instructions. Take 2 capsules (600mg ) by mouth at bedtime - may take 1 additional capsule (300mg ) by mouth daily as needed for pain 04/19/22  Yes Karamalegos, Alexander J, DO  rosuvastatin (CRESTOR) 10 MG tablet TAKE 1 TABLET(10 MG) BY MOUTH DAILY 07/04/22  Yes Karamalegos, Devonne Doughty, DO  metoprolol tartrate (LOPRESSOR) 25 MG tablet TAKE 1 TABLET(25 MG) BY MOUTH TWICE DAILY Patient not taking: Reported on 08/16/2022 03/22/22   [name], DO    Family History  Problem Relation Age of Onset   Stomach cancer Mother 110   Breast cancer Mother    Lung cancer Father 45     Social History   Tobacco Use   Smoking status: Former    Packs/day: 1.00    Years: 35.00    Total pack years: 35.00    Types: Cigarettes   Smokeless tobacco: Former    Quit date: 06/04/2018  Substance Use Topics   Alcohol use: Never   Drug use: Never    Allergies as of 08/16/2022   (No Known Allergies)    Review of Systems:    All systems reviewed and negative except where noted in HPI.  Physical Exam:  Vital signs in last 24 hours: Temp:  [98.1 F (36.7 C)] 98.1 F (36.7 C) (09/12 1011) Pulse Rate:  [88-106] 102 (09/12 1115) Resp:  [15-23] 21 (09/12 1115) BP: (67-110)/(51-67) 100/63 (09/12 1115) SpO2:  [93 %-99 %] 97 % (09/12 1115) Weight:  [95.3 kg] 95.3 kg (09/12 1012)   General:   Pleasant, cooperative in NAD Head:  Normocephalic and atraumatic. Eyes:   No icterus.   Conjunctiva pink. PERRLA. Ears:  Normal auditory acuity. Neck:  Supple; no masses or thyroidomegaly Lungs: Respirations even and unlabored. Lungs clear to auscultation bilaterally.   No wheezes, crackles, or rhonchi.  Heart:  Regular rate and rhythm;  Without murmur, clicks, rubs or gallops Abdomen:  Soft, nondistended, nontender. Normal bowel sounds. No appreciable masses or  hepatomegaly.  No rebound or guarding.  Neurologic:  Alert and oriented x3;  grossly normal neurologically. Psych:  Alert and cooperative. Normal affect.  LAB RESULTS: Recent Labs    08/16/22 1021  WBC 15.5*  HGB 9.3*  HCT 28.9*  PLT 224   BMET Recent Labs    08/16/22 1021  NA 139  K 3.9  CL 107  CO2 24  GLUCOSE 157*  BUN 69*  CREATININE 1.02*  CALCIUM 8.7*   LFT Recent Labs    08/16/22 1021  PROT 6.4*  ALBUMIN 3.5  AST 16  ALT 11  ALKPHOS 61  BILITOT 0.6   PT/INR No results for input(s): "LABPROT", "INR" in the last 72 hours.  STUDIES: DG Chest Portable 1 View  Result Date: 08/16/2022 CLINICAL DATA:  Vomiting, hematemesis and diaphoresis. EXAM: PORTABLE CHEST 1 VIEW COMPARISON:  None Available. FINDINGS: The heart size and mediastinal contours are within normal limits. Bibasilar atelectasis and scarring. There is no evidence of pulmonary edema, consolidation, pneumothorax or pleural fluid. The visualized skeletal structures are unremarkable. IMPRESSION: Bibasilar atelectasis/scarring. Electronically Signed   By: Aletta Edouard M.D.   On: 08/16/2022 11:43      Impression / Plan:   Cheryl Payne is a 56 y.o. y/o female presented to the emergency room with hematemesis.  Elevated BUN/creatinine ratio.  Over 4 g drop in hemoglobin from baseline.  Very likely upper GI bleed.  Likely etiology for upper GI bleed is chronic meloxicam use.  She did say she stopped it a while back but her history is suggestive of possible.  CT  Plan 1.  Monitor CBC transfuse as needed 2.  IV PPI 3.  Stop NSAIDs/meloxicam use 4.  EGD tomorrow   I have discussed alternative options, risks & benefits,  which include, but are not limited to, bleeding, infection, perforation,respiratory complication & drug reaction.  The patient agrees with this plan & written consent will be obtained.     Thank you for involving me in the care of this patient.      LOS: 0 days   Jonathon Bellows, MD   08/16/2022, 11:49 AM

## 2022-08-16 NOTE — H&P (Addendum)
History and Physical   Cheryl Payne EHU:[id number] DOB: Jan 13, 1966 DOA: 08/16/2022  PCP: [name], DO  Patient coming from: Home  I have personally briefly reviewed patient's old medical records in [facility].  Chief Concern: Hematemesis  HPI: [name] is a 56 year old female with history of obesity, hypertension, hyperlipidemia, depression, anxiety, who presents emergency department for chief concerns of hematemesis on 08/15/2022.  Initial vitals in the ED showed temperature of 98.1, respiration rate of 23, heart rate of 88, initial blood pressure 92/61, SPO2 99% on room air.  Serum sodium is 139, potassium 3.9, chloride 107, bicarb 24, BUN 69, serum creatinine 1.02, nonfasting blood glucose 157, GFR greater than 60, WBC 15.5, hemoglobin 9.3, platelets of 224.  High since he troponin was 23, lactic acid was elevated at 3.4.  EDP consulted GI provider who states he will see the patient. ED treatment: Ondansetron 4 mg IV, Protonix bolus and gtt., sodium chloride 1 L bolus x2.  1 unit PRBC have been ordered for transfusion --------------------- Patient is able to tell me her name, her age, and she knows she is in the hospital.  She reports vomiting bright red blood, large amounts 1 time on 08/15/2022.  She endorses right lower abdominal pain that is aching and persistent since yesterday.  She denies vomiting today.  She endorses nausea.  She denies fever, chest pain, dysuria, hematuria.  She endorses chills and shortness of breath with exertion.  She reports this is never happened before.  She denies EtOH, BC powder, Goody powder, NSAID including ibuprofen Motrin use.  She endorses black stools on 08/16/2022  Social history: She lives at home with her Harrie Jeans.  She denies tobacco, EtOH, recreational drug use.  ROS: Constitutional: no weight change, no fever ENT/Mouth: no sore throat, no rhinorrhea Eyes: no eye pain, no vision  changes Cardiovascular: no chest pain, + dyspnea,  no edema, no palpitations Respiratory: no cough, no sputum, no wheezing Gastrointestinal: + nausea, + vomiting, no diarrhea, no constipation Genitourinary: no urinary incontinence, no dysuria, no hematuria Musculoskeletal: no arthralgias, no myalgias Skin: no skin lesions, no pruritus, Neuro: + weakness, no loss of consciousness, no syncope Psych: no anxiety, no depression, + decrease appetite Heme/Lymph: no bruising, no bleeding  ED Course: Discussed with emergency medicine provider, patient requiring hospitalization for chief concerns of upper GI bleed.  Assessment/Plan  Principal Problem:   Upper GI bleed Active Problems:   Pure hypercholesterolemia   Major depressive disorder, recurrent, in partial remission (HCC)   GAD (generalized anxiety disorder)   Essential hypertension   Non-small cell carcinoma of lung (HCC)   Morbid obesity (HCC)   Leukocytosis   Assessment and Plan:  * Upper GI bleed - Patient does not have risk factors for upper GI bleed - GI has been consulted for endoscopy, Dr. Vicente Males is aware of patient - I have not ordered H. pylori evaluation and/or testing due to Protonix have been initiated - Continue Protonix gtt. - Status post 1 unit PRBC have been ordered for transfusion - Maintain bilateral upper extremity PIV (preferred large-bore) - Admit to progressive, observation  Leukocytosis - Presumed reactive, no clinical symptoms of infectious etiology at this time - Procalcitonin has been ordered - Patient is status post piperacillin-tazobactam - Aspiration caution - CBC in the a.m.  Morbid obesity ([place]) - Patient's BMI is greater than 35, and patient has hypertension. This complicates overall care and prognosis.   Essential hypertension - Holding home antihypertensive medications including amlodipine  5 mg daily, metoprolol tartrate 25 mg p.o. twice daily due to initial hypotension on presentation -  AM team to resume when appropriate  Pure hypercholesterolemia - Rosuvastatin 10 mg nightly resume  DVT prophylaxis-pharmacologic DVT prophylaxis Initiated on admission as patient presenting for upper GI bleed with symptomatic anemia - AM team to initiate pharmacologic DVT prophylaxis when the benefits outweigh the risk  Chart reviewed.   DVT prophylaxis: TED hose, Code Status: Full code Diet: N.p.o. pending GI evaluation Family Communication: Updated fianc at bedside with patient's permission Disposition Plan: Pending clinical course Consults called: Gastroenterology Admission status: Progressive, observation  Past Medical History:  Diagnosis Date   Cancer ([place])    lung   Chronic left shoulder pain    Past Surgical History:  Procedure Laterality Date   LUNG SURGERY     Social History:  reports that she has quit smoking. Her smoking use included cigarettes. She has a 35.00 pack-year smoking history. She quit smokeless tobacco use about 4 years ago. She reports that she does not drink alcohol and does not use drugs.  No Known Allergies Family History  Problem Relation Age of Onset   Stomach cancer Mother 37   Breast cancer Mother    Lung cancer Father 12   Family history: Family history reviewed and not pertinent  Prior to Admission medications   Medication Sig Start Date End Date Taking? Authorizing Provider  amLODipine (NORVASC) 5 MG tablet TAKE 1 TABLET(5 MG) BY MOUTH DAILY 02/17/22  Yes Karamalegos, Devonne Doughty, DO  aspirin EC 81 MG tablet Take 81 mg by mouth daily. Swallow whole.   Yes [provider]  escitalopram (LEXAPRO) 10 MG tablet Take 1 tablet (10 mg total) by mouth daily. Patient taking differently: Take 10 mg by mouth at bedtime. 08/04/22  Yes Karamalegos, Devonne Doughty, DO  gabapentin (NEURONTIN) 300 MG capsule TAKE 1 CAPSULE BY MOUTH AT BEDTIME. MAY INCREASE TO 2 CAPSULES AT BEDTIME AS TOLERATED. ALSO. MAY TAKE 1 CAPSULE DURNING DAY AS  NEEDED Patient taking differently: Take 600 mg by mouth at bedtime. Take 2 capsules (600mg ) by mouth at bedtime - may take 1 additional capsule (300mg ) by mouth daily as needed for pain 04/19/22  Yes Karamalegos, Alexander J, DO  rosuvastatin (CRESTOR) 10 MG tablet TAKE 1 TABLET(10 MG) BY MOUTH DAILY 07/04/22  Yes Karamalegos, Devonne Doughty, DO  metoprolol tartrate (LOPRESSOR) 25 MG tablet TAKE 1 TABLET(25 MG) BY MOUTH TWICE DAILY Patient not taking: Reported on 08/16/2022 03/22/22   [name], DO   Physical Exam: Vitals:   08/16/22 1215 08/16/22 1230 08/16/22 1245 08/16/22 1358  BP: (!) 114/100 101/78 99/65 (!) 88/66  Pulse: 93 (!) 110 98 85  Resp:  18  18  Temp:    98.6 F (37 C)  TempSrc:    Oral  SpO2: 96% 98% 96% 99%  Weight:      Height:       Constitutional: appears age-appropriate, frail, NAD, calm, comfortable Eyes: PERRL, lids and conjunctivae normal ENMT: Mucous membranes are moist. Posterior pharynx clear of any exudate or lesions. Age-appropriate dentition. Hearing appropriate Neck: normal, supple, no masses, no thyromegaly Respiratory: clear to auscultation bilaterally, no wheezing, no crackles. Normal respiratory effort. No accessory muscle use.  Cardiovascular: Regular rate and rhythm, no murmurs / rubs / gallops. No extremity edema. 2+ pedal pulses. No carotid bruits.  Abdomen: Obese abdomen with pannus no tenderness, no masses palpated, no hepatosplenomegaly. Bowel sounds positive.  Musculoskeletal: no clubbing / cyanosis.  No joint deformity upper and lower extremities. Good ROM, no contractures, no atrophy. Normal muscle tone.  Skin: no rashes, lesions, ulcers. No induration Neurologic: Sensation intact. Strength 5/5 in all 4.  Psychiatric: Normal judgment and insight. Alert and oriented x 3.  Depressed mood.  Tearful  EKG: independently reviewed, showing sinus rhythm with rate of 93, QTc 406  Chest x-ray on Admission: I personally reviewed and I agree  with radiologist reading as below.  CT ANGIO GI BLEED  Result Date: 08/16/2022 CLINICAL DATA:  Mesenteric ischemia, acute. Hematemesis since yesterday. Abdominal pain. EXAM: CTA ABDOMEN AND PELVIS WITHOUT AND WITH CONTRAST TECHNIQUE: Multidetector CT imaging of the abdomen and pelvis was performed using the standard protocol during bolus administration of intravenous contrast. Multiplanar reconstructed images and MIPs were obtained and reviewed to evaluate the vascular anatomy. RADIATION DOSE REDUCTION: This exam was performed according to the departmental dose-optimization program which includes automated exposure control, adjustment of the mA and/or kV according to patient size and/or use of iterative reconstruction technique. CONTRAST:  159mL OMNIPAQUE IOHEXOL 350 MG/ML SOLN COMPARISON:  None FINDINGS: VASCULAR Aorta: Aortic atherosclerosis. No aneurysm or stenosis of the main lumen. Celiac: Moderate stenosis at the origin with luminal diameter as narrow as 2.6 mm. Wide patency beyond that. SMA: No proximal stenosis. Distal branch vessels appear well perfused. Replaced right and left hepatic arteries. Small accessory unnamed branch from the aorta between the SMA and IMA contributing to the gastroduodenal system. This is not clinically significant. Renals: Bilateral single renal arteries. No stenosis on the right. Severe stenosis of the proximal left renal artery with luminal diameter 1 mm. IMA: Inferior mesenteric artery patent with good distal perfusion. Inflow: Normal Proximal Outflow: Ordinary atherosclerosis of the iliac vessels without flow limiting stenosis. Veins: Normal Review of the MIP images confirms the above findings. NON-VASCULAR Lower chest: Minimal atelectasis/scarring at the lower lungs. Hepatobiliary: Normal appearance of the liver. No calcified gallstones. No ductal dilatation. Pancreas: Normal Spleen: Normal Adrenals/Urinary Tract: Adrenal glands are normal. Kidneys are normal. Bladder is  normal. Stomach/Bowel: No evidence of bowel obstruction or inflammation. No visible acute hemorrhage. Lymphatic: No adenopathy. Reproductive: No pelvic mass. Other: No free fluid or air. Musculoskeletal: Ordinary mild lumbar degenerative changes. IMPRESSION: No sign of acute mesenteric ischemia. Aortic atherosclerosis without aneurysm or luminal stenosis. 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 Wide patency of the superior mesenteric and inferior mesenteric arteries. Left renal artery stenosis. No acute organ pathology. Electronically Signed   By: Nelson Chimes M.D.   On: 08/16/2022 12:09   DG Chest Portable 1 View  Result Date: 08/16/2022 CLINICAL DATA:  Vomiting, hematemesis and diaphoresis. EXAM: PORTABLE CHEST 1 VIEW COMPARISON:  None Available. FINDINGS: The heart size and mediastinal contours are within normal limits. Bibasilar atelectasis and scarring. There is no evidence of pulmonary edema, consolidation, pneumothorax or pleural fluid. The visualized skeletal structures are unremarkable. IMPRESSION: Bibasilar atelectasis/scarring. Electronically Signed   By: Aletta Edouard M.D.   On: 08/16/2022 11:43    Labs on Admission: I have personally reviewed following labs CBC: Recent Labs  Lab 08/16/22 1021  WBC 15.5*  NEUTROABS 9.9*  HGB 9.3*  HCT 28.9*  MCV 95.4  PLT 818   Basic Metabolic Panel: Recent Labs  Lab 08/16/22 1021  NA 139  K 3.9  CL 107  CO2 24  GLUCOSE 157*  BUN 69*  CREATININE 1.02*  CALCIUM 8.7*   GFR: Estimated Creatinine Clearance: 65.5 mL/min (A) (by C-G formula based on SCr of  1.02 mg/dL (H)).  Liver Function Tests: Recent Labs  Lab 08/16/22 1021  AST 16  ALT 11  ALKPHOS 61  BILITOT 0.6  PROT 6.4*  ALBUMIN 3.5   Recent Labs  Lab 08/16/22 1021  LIPASE 24   Urine analysis:    Component Value Date/Time   BILIRUBINUR Negative 12/23/2021 1037   PROTEINUR Negative 12/23/2021 1037   UROBILINOGEN 0.2 12/23/2021 1037   NITRITE Negative  12/23/2021 1037   LEUKOCYTESUR Trace (A) 12/23/2021 1037   Dr. Tobie Poet [facility]  If 7PM-7AM, please contact overnight-coverage provider If 7AM-7PM, please contact day coverage provider [email or web address]  08/16/2022, 2:08 PM

## 2022-08-16 NOTE — Assessment & Plan Note (Signed)
-   Rosuvastatin 10 mg nightly resume

## 2022-08-16 NOTE — ED Notes (Signed)
Patient to CT at this time

## 2022-08-16 NOTE — Assessment & Plan Note (Signed)
-   Holding home antihypertensive medications including amlodipine 5 mg daily, metoprolol tartrate 25 mg p.o. twice daily due to initial hypotension on presentation - AM team to resume when appropriate

## 2022-08-16 NOTE — Consult Note (Signed)
PHARMACY -  BRIEF ANTIBIOTIC NOTE   Pharmacy has received consult(s) for Zosyn from an ED provider.  The patient's profile has been reviewed for ht/wt/allergies/indication/available labs.    One time order(s) placed for Zosyn 3.375 g x1  Further antibiotics/pharmacy consults should be ordered by admitting physician if indicated.                       Thank you,  Gretel Acre, PharmD PGY1 Pharmacy Resident 08/16/2022 11:24 AM

## 2022-08-16 NOTE — ED Notes (Signed)
Blood consent signed by patient at this time.

## 2022-08-17 ENCOUNTER — Encounter: Payer: Self-pay | Admitting: Internal Medicine

## 2022-08-17 ENCOUNTER — Observation Stay: Payer: Medicare Other | Admitting: Anesthesiology

## 2022-08-17 ENCOUNTER — Encounter
Admission: EM | Disposition: A | Discharge status: Home / Self Care | Payer: Self-pay | Source: Home / Self Care | Attending: Hospitalist

## 2022-08-17 DIAGNOSIS — Z6838 Body mass index (BMI) 38.0-38.9, adult: Secondary | ICD-10-CM | POA: Diagnosis not present

## 2022-08-17 DIAGNOSIS — K254 Chronic or unspecified gastric ulcer with hemorrhage: Secondary | ICD-10-CM | POA: Diagnosis present

## 2022-08-17 DIAGNOSIS — D72829 Elevated white blood cell count, unspecified: Secondary | ICD-10-CM | POA: Diagnosis present

## 2022-08-17 DIAGNOSIS — Z801 Family history of malignant neoplasm of trachea, bronchus and lung: Secondary | ICD-10-CM | POA: Diagnosis not present

## 2022-08-17 DIAGNOSIS — M25512 Pain in left shoulder: Secondary | ICD-10-CM | POA: Diagnosis present

## 2022-08-17 DIAGNOSIS — T39395A Adverse effect of other nonsteroidal anti-inflammatory drugs [NSAID], initial encounter: Secondary | ICD-10-CM | POA: Diagnosis present

## 2022-08-17 DIAGNOSIS — F411 Generalized anxiety disorder: Secondary | ICD-10-CM | POA: Diagnosis present

## 2022-08-17 DIAGNOSIS — K922 Gastrointestinal hemorrhage, unspecified: Secondary | ICD-10-CM | POA: Diagnosis present

## 2022-08-17 DIAGNOSIS — K2289 Other specified disease of esophagus: Secondary | ICD-10-CM

## 2022-08-17 DIAGNOSIS — Z87891 Personal history of nicotine dependence: Secondary | ICD-10-CM | POA: Diagnosis not present

## 2022-08-17 DIAGNOSIS — E7800 Pure hypercholesterolemia, unspecified: Secondary | ICD-10-CM | POA: Diagnosis present

## 2022-08-17 DIAGNOSIS — Z803 Family history of malignant neoplasm of breast: Secondary | ICD-10-CM | POA: Diagnosis not present

## 2022-08-17 DIAGNOSIS — I10 Essential (primary) hypertension: Secondary | ICD-10-CM | POA: Diagnosis present

## 2022-08-17 DIAGNOSIS — K3189 Other diseases of stomach and duodenum: Secondary | ICD-10-CM

## 2022-08-17 DIAGNOSIS — E860 Dehydration: Secondary | ICD-10-CM | POA: Diagnosis present

## 2022-08-17 DIAGNOSIS — I959 Hypotension, unspecified: Secondary | ICD-10-CM | POA: Diagnosis present

## 2022-08-17 DIAGNOSIS — D62 Acute posthemorrhagic anemia: Secondary | ICD-10-CM | POA: Diagnosis present

## 2022-08-17 DIAGNOSIS — K259 Gastric ulcer, unspecified as acute or chronic, without hemorrhage or perforation: Secondary | ICD-10-CM

## 2022-08-17 DIAGNOSIS — N179 Acute kidney failure, unspecified: Secondary | ICD-10-CM | POA: Diagnosis present

## 2022-08-17 DIAGNOSIS — Z7982 Long term (current) use of aspirin: Secondary | ICD-10-CM | POA: Diagnosis not present

## 2022-08-17 DIAGNOSIS — K920 Hematemesis: Secondary | ICD-10-CM

## 2022-08-17 DIAGNOSIS — R54 Age-related physical debility: Secondary | ICD-10-CM | POA: Diagnosis present

## 2022-08-17 DIAGNOSIS — Z800 Family history of malignant neoplasm of digestive organs: Secondary | ICD-10-CM | POA: Diagnosis not present

## 2022-08-17 DIAGNOSIS — Z79899 Other long term (current) drug therapy: Secondary | ICD-10-CM | POA: Diagnosis not present

## 2022-08-17 DIAGNOSIS — G8929 Other chronic pain: Secondary | ICD-10-CM | POA: Diagnosis present

## 2022-08-17 DIAGNOSIS — F3341 Major depressive disorder, recurrent, in partial remission: Secondary | ICD-10-CM | POA: Diagnosis present

## 2022-08-17 HISTORY — PX: ESOPHAGOGASTRODUODENOSCOPY (EGD) WITH PROPOFOL: SHX5813

## 2022-08-17 LAB — BASIC METABOLIC PANEL
Anion gap: 4 — ABNORMAL LOW (ref 5–15)
BUN: 33 mg/dL — ABNORMAL HIGH (ref 6–20)
CO2: 26 mmol/L (ref 22–32)
Calcium: 8 mg/dL — ABNORMAL LOW (ref 8.9–10.3)
Chloride: 112 mmol/L — ABNORMAL HIGH (ref 98–111)
Creatinine, Ser: 0.84 mg/dL (ref 0.44–1.00)
GFR, Estimated: >60 mL/min (ref >60)
Glucose, Bld: 102 mg/dL — ABNORMAL HIGH (ref 70–99)
Potassium: 3.7 mmol/L (ref 3.5–5.1)
Sodium: 142 mmol/L (ref 135–145)

## 2022-08-17 LAB — CBC
HCT: 23.3 % — ABNORMAL LOW (ref 36.0–46.0)
Hemoglobin: 7.6 g/dL — ABNORMAL LOW (ref 12.0–15.0)
MCH: 30.9 pg (ref 26.0–34.0)
MCHC: 32.6 g/dL (ref 30.0–36.0)
MCV: 94.7 fL (ref 80.0–100.0)
Platelets: 146 10*3/uL — ABNORMAL LOW (ref 150–400)
RBC: 2.46 MIL/uL — ABNORMAL LOW (ref 3.87–5.11)
RDW: 17.3 % — ABNORMAL HIGH (ref 11.5–15.5)
WBC: 6.3 10*3/uL (ref 4.0–10.5)
nRBC: 1 % — ABNORMAL HIGH (ref 0.0–0.2)

## 2022-08-17 LAB — HIV ANTIBODY (ROUTINE TESTING W REFLEX): HIV Screen 4th Generation wRfx: NONREACTIVE

## 2022-08-17 SURGERY — ESOPHAGOGASTRODUODENOSCOPY (EGD) WITH PROPOFOL
Anesthesia: General

## 2022-08-17 MED ORDER — PROPOFOL 500 MG/50ML IV EMUL
INTRAVENOUS | Status: DC | PRN
  Administered 2022-08-17: 150 ug/kg/min via INTRAVENOUS

## 2022-08-17 MED ORDER — CALCIUM CARBONATE ANTACID 500 MG PO CHEW: 1.0000 | CHEWABLE_TABLET | Freq: Three times a day (TID) | ORAL | Status: DC | PRN

## 2022-08-17 MED ORDER — SODIUM CHLORIDE 0.9 % IV SOLN: INTRAVENOUS | Status: DC

## 2022-08-17 MED ORDER — LIDOCAINE HCL (CARDIAC) PF 100 MG/5ML IV SOSY
PREFILLED_SYRINGE | INTRAVENOUS | Status: DC | PRN
  Administered 2022-08-17: 100 mg via INTRAVENOUS

## 2022-08-17 MED ORDER — PHENYLEPHRINE 80 MCG/ML (10ML) SYRINGE FOR IV PUSH (FOR BLOOD PRESSURE SUPPORT)
PREFILLED_SYRINGE | INTRAVENOUS | Status: DC | PRN
  Administered 2022-08-17 (×2): 160 ug via INTRAVENOUS

## 2022-08-17 MED ORDER — ALUM & MAG HYDROXIDE-SIMETH 200-200-20 MG/5ML PO SUSP
15.0000 mL | Freq: Four times a day (QID) | ORAL | Status: DC | PRN
  Administered 2022-08-17: 15 mL via ORAL
  Filled 2022-08-17: qty 30

## 2022-08-17 MED ORDER — GLYCOPYRROLATE 0.2 MG/ML IJ SOLN
INTRAMUSCULAR | Status: DC | PRN
  Administered 2022-08-17: .2 mg via INTRAVENOUS

## 2022-08-17 MED ORDER — SODIUM CHLORIDE 0.9 % IV SOLN: INTRAVENOUS | Status: AC

## 2022-08-17 MED ORDER — SODIUM CHLORIDE 0.9 % IV BOLUS
500.0000 mL | Freq: Once | INTRAVENOUS | Status: AC
  Administered 2022-08-17: 500 mL via INTRAVENOUS

## 2022-08-17 MED ORDER — PROPOFOL 10 MG/ML IV BOLUS
INTRAVENOUS | Status: DC | PRN
  Administered 2022-08-17: 60 mg via INTRAVENOUS

## 2022-08-17 NOTE — Transfer of Care (Addendum)
Immediate Anesthesia Transfer of Care Note  Patient: Cheryl Payne  Procedure(s) Performed: ESOPHAGOGASTRODUODENOSCOPY (EGD) WITH PROPOFOL  Patient Location: Endoscopy Unit  Anesthesia Type:General  Level of Consciousness: drowsy and patient cooperative  Airway & Oxygen Therapy: Patient Spontanous Breathing and Patient connected to face mask oxygen  Post-op Assessment: Report given to RN and Post -op Vital signs reviewed and stable  Post vital signs: Reviewed and stable  Last Vitals:  Vitals Value Taken Time  BP 142/104 08/17/22 0906  Temp    Pulse 80 08/17/22 0906  Resp 10 08/17/22 0906  SpO2 100 % 08/17/22 0906    Last Pain:  Vitals:   08/17/22 0906  TempSrc:   PainSc: Asleep         Complications: No notable events documented.

## 2022-08-17 NOTE — Addendum Note (Signed)
Addendum  created 08/17/22 1114 by Ilene Qua, MD   Clinical Note Signed

## 2022-08-17 NOTE — H&P (Signed)
Cheryl Bellows, MD [address] [place], [place], [place], [address] [address], [place], [place], [place], [address] Phone: [phone]  Fax: [phone]  Primary Care Physician:  [name], DO   Pre-Procedure History & Physical: HPI:  Cheryl Payne is a 56 y.o. female is here for an endoscopy    Past Medical History:  Diagnosis Date   Cancer [facility])    lung   Chronic left shoulder pain     Past Surgical History:  Procedure Laterality Date   LUNG SURGERY      Prior to Admission medications   Medication Sig Start Date End Date Taking? Authorizing Provider  amLODipine (NORVASC) 5 MG tablet TAKE 1 TABLET(5 MG) BY MOUTH DAILY 02/17/22  Yes Karamalegos, Devonne Doughty, DO  aspirin EC 81 MG tablet Take 81 mg by mouth daily. Swallow whole.   Yes [provider]  escitalopram (LEXAPRO) 10 MG tablet Take 1 tablet (10 mg total) by mouth daily. Patient taking differently: Take 10 mg by mouth at bedtime. 08/04/22  Yes Karamalegos, Devonne Doughty, DO  gabapentin (NEURONTIN) 300 MG capsule TAKE 1 CAPSULE BY MOUTH AT BEDTIME. MAY INCREASE TO 2 CAPSULES AT BEDTIME AS TOLERATED. ALSO. MAY TAKE 1 CAPSULE DURNING DAY AS NEEDED Patient taking differently: Take 600 mg by mouth at bedtime. Take 2 capsules (600mg ) by mouth at bedtime - may take 1 additional capsule (300mg ) by mouth daily as needed for pain 04/19/22  Yes Karamalegos, Alexander J, DO  rosuvastatin (CRESTOR) 10 MG tablet TAKE 1 TABLET(10 MG) BY MOUTH DAILY 07/04/22  Yes Karamalegos, Devonne Doughty, DO  metoprolol tartrate (LOPRESSOR) 25 MG tablet TAKE 1 TABLET(25 MG) BY MOUTH TWICE DAILY Patient not taking: Reported on 08/16/2022 03/22/22   [name], DO    Allergies as of 08/16/2022   (No Known Allergies)    Family History  Problem Relation Age of Onset   Stomach cancer Mother 53   Breast cancer Mother    Lung cancer Father 42    Social History   Socioeconomic History   Marital status:  Single    Spouse name: Not on file   Number of children: Not on file   Years of education: Not on file   Highest education level: Not on file  Occupational History   Not on file  Tobacco Use   Smoking status: Former    Packs/day: 1.00    Years: 35.00    Total pack years: 35.00    Types: Cigarettes   Smokeless tobacco: Former    Quit date: 06/04/2018  Substance and Sexual Activity   Alcohol use: Never   Drug use: Never   Sexual activity: Not on file  Other Topics Concern   Not on file  Social History Narrative   Not on file   Social Determinants of Health   Financial Resource Strain: [place]  (07/15/2022)   Overall Financial Resource Strain (CARDIA)    Difficulty of Paying Living Expenses: Not hard at all  Food Insecurity: No Food Insecurity (07/15/2022)   Hunger Vital Sign    Worried About Running Out of Food in the Last Year: Never true    [place] in the Last Year: Never true  Transportation Needs: No Transportation Needs (07/15/2022)   PRAPARE - Hydrologist (Medical): No    Lack of Transportation (Non-Medical): No  Physical Activity: Sufficiently Active (07/15/2022)   Exercise Vital Sign    Days of  Exercise per Week: 3 days    Minutes of Exercise per Session: 60 min  Stress: No Stress Concern Present (07/15/2022)   [place]    Feeling of Stress : Not at all  Social Connections: Moderately Isolated (07/15/2022)   Social Connection and Isolation Panel [NHANES]    Frequency of Communication with Friends and Family: More than three times a week    Frequency of Social Gatherings with Friends and Family: Once a week    Attends Religious Services: More than 4 times per year    Active Member of [employer] or Organizations: No    Attends Archivist Meetings: Never    Marital Status: Never married  Intimate Partner Violence: Not At Risk (07/15/2022)   Humiliation, Afraid,  Rape, and Kick questionnaire    Fear of Current or Ex-Partner: No    Emotionally Abused: No    Physically Abused: No    Sexually Abused: No    Review of Systems: See HPI, otherwise negative ROS  Physical Exam: BP (!) 86/59 (BP Location: Right Arm)   Pulse 90   Temp 98.6 F (37 C) (Oral)   Resp 14   Ht 5\' 2"  (1.575 m)   Wt 95.3 kg   SpO2 100%   BMI 38.41 kg/m  General:   Alert,  pleasant and cooperative in NAD Head:  Normocephalic and atraumatic. Neck:  Supple; no masses or thyromegaly. Lungs:  Clear throughout to auscultation, normal respiratory effort.    Heart:  +S1, +S2, Regular rate and rhythm, No edema. Abdomen:  Soft, nontender and nondistended. Normal bowel sounds, without guarding, and without rebound.   Neurologic:  Alert and  oriented x4;  grossly normal neurologically.  Impression/Plan: Cheryl Payne is here for an endoscopy  to be performed for  evaluation of hematemesis    Risks, benefits, limitations, and alternatives regarding endoscopy have been reviewed with the patient.  Questions have been answered.  All parties agreeable.   Cheryl Bellows, MD  08/17/2022, 8:29 AM

## 2022-08-17 NOTE — Op Note (Signed)
[facility] Gastroenterology Patient Name: Cheryl Payne Procedure Date: 08/17/2022 8:33 AM MRN: [id number] Account #: [id number] Date of Birth: 17-Mar-1966 Admit Type: Inpatient Age: 56 Room: [facility] ENDO ROOM 2 Gender: Female Note Status: Finalized Instrument Name: Upper Endoscope [id number] Procedure:             Upper GI endoscopy Indications:           Hematemesis Providers:             Jonathon Bellows MD, MD Referring MD:          [name] (Referring MD) Medicines:             Monitored Anesthesia Care Complications:         No immediate complications. Procedure:             Pre-Anesthesia Assessment:                        - Prior to the procedure, a History and Physical was                         performed, and patient medications, allergies and                         sensitivities were reviewed. The patient's tolerance                         of previous anesthesia was reviewed.                        - The risks and benefits of the procedure and the                         sedation options and risks were discussed with the                         patient. All questions were answered and informed                         consent was obtained.                        - ASA Grade Assessment: III - A patient with severe                         systemic disease.                        After obtaining informed consent, the endoscope was                         passed under direct vision. Throughout the procedure,                         the patient's blood pressure, pulse, and oxygen                         saturations were monitored continuously. The Endoscope                         was introduced through  the mouth, and advanced to the                         third part of duodenum. The upper GI endoscopy was                         accomplished with ease. The patient tolerated the                         procedure well. Findings:      Localized  moderately erythematous mucosa without active bleeding and       with no stigmata of bleeding was found in the duodenal bulb. Biopsies       were taken with a cold forceps for histology.      Three non-bleeding linear gastric ulcers with a clean ulcer base       (Forrest Class III) were found on the greater curvature of the stomach,       at the incisura and in the prepyloric region of the stomach. The largest       lesion was 10 mm in largest dimension. Biopsies were taken with a cold       forceps for histology.      A single 15 mm mucosal nodule with a localized distribution was found at       the gastroesophageal junction. Biopsies were taken with a cold forceps       for histology.      The cardia and gastric fundus were normal on retroflexion. Impression:            - Erythematous duodenopathy. Biopsied.                        - Non-bleeding gastric ulcers with a clean ulcer base                         (Forrest Class III). Biopsied.                        - Mucosal nodule found in the esophagus. Biopsied. Recommendation:        - Await pathology results.                        - Return patient to hospital ward for ongoing care.                        - Advance diet as tolerated.                        - Stop nsaid use                        Continue oral omeporazole 40 mg 3 months                        Repeat EGD in 8 weeks to check for healing of ulcers                        Nodule at GER junction will need eus as outpatient  based on path results                        Home tomorrow if no further drop in hb or bleeding Procedure Code(s):     --- Professional ---                        [phone], Esophagogastroduodenoscopy, flexible,                         transoral; with biopsy, single or multiple Diagnosis Code(s):     --- Professional ---                        K31.89, Other diseases of stomach and duodenum                        K25.9, Gastric ulcer,  unspecified as acute or chronic,                         without hemorrhage or perforation                        K22.8, Other specified diseases of esophagus                        K92.0, Hematemesis CPT copyright 2019 American Medical Association. All rights reserved. The codes documented in this report are preliminary and upon coder review may  be revised to meet current compliance requirements. Jonathon Bellows, MD Jonathon Bellows MD, MD 08/17/2022 9:04:50 AM This report has been signed electronically. Number of Addenda: 0 Note Initiated On: 08/17/2022 8:33 AM Estimated Blood Loss:  Estimated blood loss: none.      [facility]

## 2022-08-17 NOTE — Anesthesia Procedure Notes (Signed)
Procedure Name: General with mask airway Date/Time: 08/17/2022 9:00 AM  Performed by: Kelton Pillar, CRNAPre-anesthesia Checklist: Patient identified, Emergency Drugs available, Suction available and Patient being monitored Patient Re-evaluated:Patient Re-evaluated prior to induction Oxygen Delivery Method: Simple face mask Induction Type: IV induction Placement Confirmation: positive ETCO2, CO2 detector and breath sounds checked- equal and bilateral Dental Injury: Teeth and Oropharynx as per pre-operative assessment

## 2022-08-17 NOTE — Anesthesia Preprocedure Evaluation (Addendum)
Anesthesia Evaluation  Patient identified by MRN, date of birth, ID band Patient awake    Reviewed: Allergy & Precautions, NPO status , Patient's Chart, lab work & pertinent test results  History of Anesthesia Complications Negative for: history of anesthetic complications  Airway Mallampati: II  TM Distance: >3 FB Neck ROM: Full    Dental  (+) Missing,    Pulmonary former smoker,  History of lung cancer    Pulmonary exam normal breath sounds clear to auscultation       Cardiovascular Exercise Tolerance: Good hypertension, Normal cardiovascular exam Rhythm:Regular Rate:Normal     Neuro/Psych PSYCHIATRIC DISORDERS Anxiety Depression negative neurological ROS     GI/Hepatic negative GI ROS, Neg liver ROS,   Endo/Other  negative endocrine ROS  Renal/GU negative Renal ROS  negative genitourinary   Musculoskeletal negative musculoskeletal ROS (+)   Abdominal   Peds negative pediatric ROS (+)  Hematology  (+) Blood dyscrasia, anemia , Thrombocytopenia    Anesthesia Other Findings   Reproductive/Obstetrics negative OB ROS                           Anesthesia Physical Anesthesia Plan  ASA: 3  Anesthesia Plan: General   Post-op Pain Management: Minimal or no pain anticipated   Induction: Intravenous  PONV Risk Score and Plan: 2 and Propofol infusion  Airway Management Planned: Natural Airway and Nasal Cannula  Additional Equipment:   Intra-op Plan:   Post-operative Plan:   Informed Consent: I have reviewed the patients History and Physical, chart, labs and discussed the procedure including the risks, benefits and alternatives for the proposed anesthesia with the patient or authorized representative who has indicated his/her understanding and acceptance.     Dental Advisory Given  Plan Discussed with: Anesthesiologist, CRNA and Surgeon  Anesthesia Plan Comments: (Patient  consented for risks of anesthesia including but not limited to:  - adverse reactions to medications - risk of airway placement if required - damage to eyes, teeth, lips or other oral mucosa - nerve damage due to positioning  - sore throat or hoarseness - Damage to heart, brain, nerves, lungs, other parts of body or loss of life  Patient voiced understanding.)        Anesthesia Quick Evaluation

## 2022-08-17 NOTE — Progress Notes (Signed)
PROGRESS NOTE    Cheryl Payne  PYP:[id number] DOB: 08/17/1966 DOA: 08/16/2022 PCP: [name], DO  [id number]  LOS: 0 days   Brief hospital course: [name] is a 56 year old female with history of obesity, hypertension, hyperlipidemia, depression, anxiety, who presents emergency department for chief concerns of hematemesis on 08/15/2022.  Initial vitals in the ED showed temperature of 98.1, respiration rate of 23, heart rate of 88, initial blood pressure 92/61, SPO2 99% on room air.  Serum sodium is 139, potassium 3.9, chloride 107, bicarb 24, BUN 69, serum creatinine 1.02, nonfasting blood glucose 157, GFR greater than 60, WBC 15.5, hemoglobin 9.3, platelets of 224.  High since he troponin was 23, lactic acid was elevated at 3.4.  EDP consulted GI provider who states he will see the patient. ED treatment: Ondansetron 4 mg IV, Protonix bolus and gtt., sodium chloride 1 L bolus x2.  1 unit PRBC have been ordered for transfusion   Assessment & Plan: [name] is a 56 year old female with history of obesity, hypertension, hyperlipidemia, depression, anxiety, who presents emergency department for chief concerns of hematemesis and black stool.  * Upper GI bleed --EGD today, found multiple gastric ulcers likely due to meloxicam use Plan: - Continue Protonix gtt. --will discharge on omeprazole 40 mg for 3 months --repeat EGD in 8 weeks   mass like lesion at GE junction  --bx taken  --f/u with Dr. Vicente Males  Acute blood loss anemia --Hgb was 14 back in Aug 2023, and was 7.6 this morning even after 1u pRBC yesterday --monitor Hgb  Hypotension --Pt reported not having taken her BP med several days PTA.  Hypotension likely due to blood loss and dehydration. --500 ml bolus x2 today Plan: --cont MIVF@100  for 10 hours    Leukocytosis, resolved - Presumed reactive, no clinical symptoms of infectious etiology at this time   Morbid obesity ([place]) -  Patient's BMI is greater than 35, and patient has hypertension. This complicates overall care and prognosis.    Essential hypertension - Holding home antihypertensive medications including amlodipine 5 mg daily, metoprolol tartrate 25 mg p.o. twice daily due to hypotension    Pure hypercholesterolemia - cont Rosuvastatin 10 mg nightly    DVT prophylaxis: SCD/Compression stockings Code Status: Full code  Family Communication: significant other updated at bedside today Level of care: Progressive Dispo:   The patient is from: home Anticipated d/c is to: home Anticipated d/c date is: tomorrow   Subjective and Interval History:  Pt had low BP down to 70 systolic after EGD, received 500 ml bolus x2 which only improved systolic to 24'P.  Pt was asymptomatic and mentation well.   Objective: Vitals:   08/17/22 1005 08/17/22 1042 08/17/22 1157 08/17/22 1530  BP: (!) 82/51 (!) 84/57 (!) 89/58 (!) 85/49  Pulse: 79 77 77 75  Resp:   14 14  Temp: 97.8 F (36.6 C)  97.9 F (36.6 C) 98.1 F (36.7 C)  TempSrc:    Oral  SpO2: 96% 98% (!) 86% 97%  Weight:      Height:        Intake/Output Summary (Last 24 hours) at 08/17/2022 1901 Last data filed at 08/17/2022 0906 Gross per 24 hour  Intake 578.69 ml  Output --  Net 578.69 ml   Filed Weights   08/16/22 1012  Weight: 95.3 kg    Examination:   Constitutional: NAD, AAOx3 HEENT: conjunctivae and lids normal, EOMI CV: No cyanosis.   RESP: normal respiratory effort,  on RA Neuro: II - XII grossly intact.   Psych: Normal mood and affect.  Appropriate judgement and reason   Data Reviewed: I have personally reviewed labs and imaging studies  Time spent: 50 minutes  Enzo Bi, MD [facility] If 7PM-7AM, please contact night-coverage 08/17/2022, 7:01 PM

## 2022-08-17 NOTE — Anesthesia Postprocedure Evaluation (Addendum)
Anesthesia Post Note  Patient: Cheryl Payne  Procedure(s) Performed: ESOPHAGOGASTRODUODENOSCOPY (EGD) WITH PROPOFOL  Patient location during evaluation: Endoscopy Anesthesia Type: General Level of consciousness: awake and alert Pain management: pain level controlled Vital Signs Assessment: post-procedure vital signs reviewed and stable Respiratory status: spontaneous breathing, nonlabored ventilation, respiratory function stable and patient connected to nasal cannula oxygen Cardiovascular status: blood pressure returned to baseline and stable Postop Assessment: no apparent nausea or vomiting Anesthetic complications: no Comments: Initial BP upon arrival to preop 84/59. Patient required small bolus of norepi during the procedure. Upon arrival to PACU initial BP 142/104, next BP 70/44, received 1L crystalloid in recovery. BP 85/57 prior to going back to the floor.    No notable events documented.   Last Vitals:  Vitals:   08/17/22 0906 08/17/22 0917  BP: (!) 142/104 (!) 70/44  Pulse: 80 80  Resp: 10 (!) 22  Temp:    SpO2: 100% 100%    Last Pain:  Vitals:   08/17/22 0917  TempSrc:   PainSc: 0-No pain                 Ilene Qua

## 2022-08-18 ENCOUNTER — Telehealth: Payer: Self-pay

## 2022-08-18 LAB — CBC
HCT: 21.5 % — ABNORMAL LOW (ref 36.0–46.0)
Hemoglobin: 7.2 g/dL — ABNORMAL LOW (ref 12.0–15.0)
MCH: 31.7 pg (ref 26.0–34.0)
MCHC: 33.5 g/dL (ref 30.0–36.0)
MCV: 94.7 fL (ref 80.0–100.0)
Platelets: 130 10*3/uL — ABNORMAL LOW (ref 150–400)
RBC: 2.27 MIL/uL — ABNORMAL LOW (ref 3.87–5.11)
RDW: 17 % — ABNORMAL HIGH (ref 11.5–15.5)
WBC: 6.3 10*3/uL (ref 4.0–10.5)
nRBC: 0.5 % — ABNORMAL HIGH (ref 0.0–0.2)

## 2022-08-18 LAB — BASIC METABOLIC PANEL
Anion gap: 4 — ABNORMAL LOW (ref 5–15)
BUN: 15 mg/dL (ref 6–20)
CO2: 25 mmol/L (ref 22–32)
Calcium: 7.9 mg/dL — ABNORMAL LOW (ref 8.9–10.3)
Chloride: 114 mmol/L — ABNORMAL HIGH (ref 98–111)
Creatinine, Ser: 0.62 mg/dL (ref 0.44–1.00)
GFR, Estimated: >60 mL/min (ref >60)
Glucose, Bld: 101 mg/dL — ABNORMAL HIGH (ref 70–99)
Potassium: 3.6 mmol/L (ref 3.5–5.1)
Sodium: 143 mmol/L (ref 135–145)

## 2022-08-18 LAB — MAGNESIUM: Magnesium: 2.1 mg/dL (ref 1.7–2.4)

## 2022-08-18 LAB — PREPARE RBC (CROSSMATCH)

## 2022-08-18 LAB — SURGICAL PATHOLOGY

## 2022-08-18 MED ORDER — ESCITALOPRAM OXALATE 10 MG PO TABS: 10.0000 mg | ORAL_TABLET | Freq: Every day | ORAL | Status: DC

## 2022-08-18 MED ORDER — SODIUM CHLORIDE 0.9% IV SOLUTION: Freq: Once | INTRAVENOUS | Status: AC

## 2022-08-18 MED ORDER — OMEPRAZOLE 40 MG PO CPDR: 40.0000 mg | DELAYED_RELEASE_CAPSULE | Freq: Every day | ORAL | 2 refills | Status: DC

## 2022-08-18 MED ORDER — AMLODIPINE BESYLATE 5 MG PO TABS: ORAL_TABLET | ORAL | 3 refills | Status: DC

## 2022-08-18 MED ORDER — GABAPENTIN 300 MG PO CAPS: 600.0000 mg | ORAL_CAPSULE | Freq: Every day | ORAL | Status: DC

## 2022-08-18 NOTE — Discharge Summary (Addendum)
Physician Discharge Summary   Cheryl Payne  female DOB: 29-Mar-1966  XVQ:[id number]  PCP: [name], DO  Admit date: 08/16/2022 Discharge date: 08/18/2022  Admitted From: home Disposition:  home CODE STATUS: Full code  Discharge Instructions     Discharge instructions   Complete by: As directed    You have ulcers in your stomach which likely bled.  Please take Prilosec for 3 months.  You have a nodule in between your esophagus and stomach.  Dr. Vicente Males took a biopsy.  Please follow up with him for the biopsy result.  Please follow up with your primary care doctor in 1-2 weeks to check your blood count.  Because your blood pressure was low, please hold your amlodipine until you follow up with your primary care doctor.   Dr. Enzo Bi [facility] Course:  For full details, please see H&P, progress notes, consult notes and ancillary notes.  Briefly,  Cheryl Payne is a 56 year old female with history of obesity, hypertension, who presented to emergency department for chief concerns of hematemesis and black stool.   * Upper GI bleed --EGD found multiple gastric ulcers likely due to meloxicam use --pt was discharged on omeprazole 40 mg for 3 months --outpatient f/u with GI Dr. Vicente Males and repeat EGD in 8 weeks    mass like lesion at GE junction  --bx taken  --f/u with Dr. Vicente Males for biopsy result   Acute blood loss anemia --Hgb was 14 back in Aug 2023, and dropped down to 7's.  Pt received 1u pRBC. --Hgb 7.2 prior to discharge.  Pt was advised to follow up with PCP in 1 week to check Hgb.   Hypotension --Pt reported not having taken her BP med several days PTA.  Hypotension likely due to blood loss and dehydration. --Pt received IVF bolus and MIVF with improvement in BP. --Home amlodipine held until followup with outpatient provider.   Leukocytosis, resolved - Presumed reactive, no clinical symptoms of infectious etiology at this time   Morbid  obesity ([place]) - Patient's BMI is greater than 35, and patient has hypertension. This complicates overall care and prognosis.    Hx of Essential hypertension, currently not active - Not taking metop PTA. --Home amlodipine held due to hypotension until followup with outpatient provider.   Pure hypercholesterolemia - cont Rosuvastatin   AKI Cr 1.02 on presentation, improved to 0.62 prior to discharge.   Discharge Diagnoses:  Principal Problem:   Upper GI bleed Active Problems:   Pure hypercholesterolemia   Major depressive disorder, recurrent, in partial remission (HCC)   GAD (generalized anxiety disorder)   Essential hypertension   Morbid obesity (HCC)   Leukocytosis   GI bleed   30 Day Unplanned Readmission Risk Score    Flowsheet Row ED to Hosp-Admission (Current) from 08/16/2022 in [place] [facility]  30 Day Unplanned Readmission Risk Score (%) 12.66 Filed at 08/18/2022 0801       This score is the patient's risk of an unplanned readmission within 30 days of being discharged (0 -100%). The score is based on dignosis, age, lab data, medications, orders, and past utilization.   Low:  0-14.9   Medium: 15-21.9   High: 22-29.9   Extreme: 30 and above         Discharge Instructions:  Allergies as of 08/18/2022   No Known Allergies      Medication List     STOP taking these medications  metoprolol tartrate 25 MG tablet Commonly known as: LOPRESSOR       TAKE these medications    amLODipine 5 MG tablet Commonly known as: NORVASC Hold until followup with PCP since your blood pressure has been low. What changed: See the new instructions.   aspirin EC 81 MG tablet Take 81 mg by mouth daily. Swallow whole.   escitalopram 10 MG tablet Commonly known as: LEXAPRO Take 1 tablet (10 mg total) by mouth at bedtime. Home med. What changed:  when to take this additional instructions   gabapentin 300 MG capsule Commonly known as: NEURONTIN Take  2 capsules (600 mg total) by mouth at bedtime. Take 2 capsules (600mg ) by mouth at bedtime - may take 1 additional capsule (300mg ) by mouth daily as needed for pain.  Home med. What changed: See the new instructions.   omeprazole 40 MG capsule Commonly known as: PRILOSEC Take 1 capsule (40 mg total) by mouth daily.   rosuvastatin 10 MG tablet Commonly known as: CRESTOR TAKE 1 TABLET(10 MG) BY MOUTH DAILY         Follow-up Information     [name], DO. Go in 1 week(s).   Specialty: Family Medicine Why: Appointment on Tuesday, 08/30/2022 at 10:40am. Please arrive by 10:25am. Contact information: [place] [address] [phone]         Jonathon Bellows, MD Follow up in 8 week(s).   Specialty: Gastroenterology Why: Repeat EGD in 8 weeks to check for healing of ulcers Contact information: [place] [address] [phone]                 No Known Allergies   The results of significant diagnostics from this hospitalization (including imaging, microbiology, ancillary and laboratory) are listed below for reference.   Consultations:   Procedures/Studies: CT ANGIO GI BLEED  Result Date: 08/16/2022 CLINICAL DATA:  Mesenteric ischemia, acute. Hematemesis since yesterday. Abdominal pain. EXAM: CTA ABDOMEN AND PELVIS WITHOUT AND WITH CONTRAST TECHNIQUE: Multidetector CT imaging of the abdomen and pelvis was performed using the standard protocol during bolus administration of intravenous contrast. Multiplanar reconstructed images and MIPs were obtained and reviewed to evaluate the vascular anatomy. RADIATION DOSE REDUCTION: This exam was performed according to the departmental dose-optimization program which includes automated exposure control, adjustment of the mA and/or kV according to patient size and/or use of iterative reconstruction technique. CONTRAST:  136mL OMNIPAQUE IOHEXOL 350 MG/ML SOLN COMPARISON:  None  FINDINGS: VASCULAR Aorta: Aortic atherosclerosis. No aneurysm or stenosis of the main lumen. Celiac: Moderate stenosis at the origin with luminal diameter as narrow as 2.6 mm. Wide patency beyond that. SMA: No proximal stenosis. Distal branch vessels appear well perfused. Replaced right and left hepatic arteries. Small accessory unnamed branch from the aorta between the SMA and IMA contributing to the gastroduodenal system. This is not clinically significant. Renals: Bilateral single renal arteries. No stenosis on the right. Severe stenosis of the proximal left renal artery with luminal diameter 1 mm. IMA: Inferior mesenteric artery patent with good distal perfusion. Inflow: Normal Proximal Outflow: Ordinary atherosclerosis of the iliac vessels without flow limiting stenosis. Veins: Normal Review of the MIP images confirms the above findings. NON-VASCULAR Lower chest: Minimal atelectasis/scarring at the lower lungs. Hepatobiliary: Normal appearance of the liver. No calcified gallstones. No ductal dilatation. Pancreas: Normal Spleen: Normal Adrenals/Urinary Tract: Adrenal glands are normal. Kidneys are normal. Bladder is normal. Stomach/Bowel: No evidence of bowel obstruction or inflammation. No  visible acute hemorrhage. Lymphatic: No adenopathy. Reproductive: No pelvic mass. Other: No free fluid or air. Musculoskeletal: Ordinary mild lumbar degenerative changes. IMPRESSION: No sign of acute mesenteric ischemia. Aortic atherosclerosis without aneurysm or luminal stenosis. 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 Wide patency of the superior mesenteric and inferior mesenteric arteries. Left renal artery stenosis. No acute organ pathology. Electronically Signed   By: Nelson Chimes M.D.   On: 08/16/2022 12:09   DG Chest Portable 1 View  Result Date: 08/16/2022 CLINICAL DATA:  Vomiting, hematemesis and diaphoresis. EXAM: PORTABLE CHEST 1 VIEW COMPARISON:  None Available. FINDINGS: The heart size and  mediastinal contours are within normal limits. Bibasilar atelectasis and scarring. There is no evidence of pulmonary edema, consolidation, pneumothorax or pleural fluid. The visualized skeletal structures are unremarkable. IMPRESSION: Bibasilar atelectasis/scarring. Electronically Signed   By: Aletta Edouard M.D.   On: 08/16/2022 11:43      Labs: BNP (last 3 results) No results for input(s): "BNP" in the last 8760 hours. Basic Metabolic Panel: Recent Labs  Lab 08/16/22 1021 08/17/22 0709 08/18/22 0405  NA 139 142 143  K 3.9 3.7 3.6  CL 107 112* 114*  CO2 24 26 25   GLUCOSE 157* 102* 101*  BUN 69* 33* 15  CREATININE 1.02* 0.84 0.62  CALCIUM 8.7* 8.0* 7.9*  MG  --   --  2.1   Liver Function Tests: Recent Labs  Lab 08/16/22 1021  AST 16  ALT 11  ALKPHOS 61  BILITOT 0.6  PROT 6.4*  ALBUMIN 3.5   Recent Labs  Lab 08/16/22 1021  LIPASE 24   No results for input(s): "AMMONIA" in the last 168 hours. CBC: Recent Labs  Lab 08/16/22 1021 08/17/22 0709 08/18/22 0405  WBC 15.5* 6.3 6.3  NEUTROABS 9.9*  --   --   HGB 9.3* 7.6* 7.2*  HCT 28.9* 23.3* 21.5*  MCV 95.4 94.7 94.7  PLT 224 146* 130*   Cardiac Enzymes: No results for input(s): "CKTOTAL", "CKMB", "CKMBINDEX", "TROPONINI" in the last 168 hours. BNP: Invalid input(s): "POCBNP" CBG: No results for input(s): "GLUCAP" in the last 168 hours. D-Dimer No results for input(s): "DDIMER" in the last 72 hours. Hgb A1c No results for input(s): "HGBA1C" in the last 72 hours. Lipid Profile No results for input(s): "CHOL", "HDL", "LDLCALC", "TRIG", "CHOLHDL", "LDLDIRECT" in the last 72 hours. Thyroid function studies No results for input(s): "TSH", "T4TOTAL", "T3FREE", "THYROIDAB" in the last 72 hours.  Invalid input(s): "FREET3" Anemia work up No results for input(s): "VITAMINB12", "FOLATE", "FERRITIN", "TIBC", "IRON", "RETICCTPCT" in the last 72 hours. Urinalysis    Component Value Date/Time   BILIRUBINUR  Negative 12/23/2021 1037   PROTEINUR Negative 12/23/2021 1037   UROBILINOGEN 0.2 12/23/2021 1037   NITRITE Negative 12/23/2021 1037   LEUKOCYTESUR Trace (A) 12/23/2021 1037   Sepsis Labs Recent Labs  Lab 08/16/22 1021 08/17/22 0709 08/18/22 0405  WBC 15.5* 6.3 6.3   Microbiology No results found for this or any previous visit (from the past 240 hour(s)).   Total time spend on discharging this patient, including the last patient exam, discussing the hospital stay, instructions for ongoing care as it relates to all pertinent caregivers, as well as preparing the medical discharge records, prescriptions, and/or referrals as applicable, is 35 minutes.    Enzo Bi, MD  [facility] 08/18/2022, 1:34 PM

## 2022-08-18 NOTE — Progress Notes (Signed)
  Transition of Care [facility]) Screening Note   Patient Details  Name: Cheryl Payne Date of Birth: Jun 27, 1966   Transition of Care [facility]) CM/SW Contact:    Alberteen Sam, LCSW Phone Number: 08/18/2022, 7:21 AM    Transition of Care Department [facility]) has reviewed patient and no TOC needs have been identified at this time. We will continue to monitor patient advancement through interdisciplinary progression rounds. If new patient transition needs arise, please place a TOC consult.  [place], [place]

## 2022-08-18 NOTE — Telephone Encounter (Signed)
Transition Care Management Follow-up Telephone Call Date of discharge and from where: [facility] 08/18/22 How have you been since you were released from the hospital? okay Any questions or concerns? No  Items Reviewed: Did the pt receive and understand the discharge instructions provided? Yes  Medications obtained and verified? Yes  Other? No  Any new allergies since your discharge? No  Dietary orders reviewed? No Do you have support at home? Yes    Functional Questionnaire: (I = Independent and D = Dependent) ADLs: I  Bathing/Dressing- I  Meal Prep- I  Eating- I  Maintaining continence- I  Transferring/Ambulation- I  Managing Meds- I  Follow up appointments reviewed:  PCP Hospital f/u appt confirmed? Yes  Scheduled to see Dr.Karamalegos on 08/30/22 @ 10:40. Are transportation arrangements needed? No  If their condition worsens, is the pt aware to call PCP or go to the Emergency Dept.? Yes Was the patient provided with contact information for the PCP's office or ED? Yes Was to pt encouraged to call back with questions or concerns? Yes

## 2022-08-19 LAB — TYPE AND SCREEN
ABO/RH(D): B POS
Antibody Screen: NEGATIVE
Unit division: 0
Unit division: 0

## 2022-08-19 LAB — BPAM RBC
Blood Product Expiration Date: 202309252359
Blood Product Expiration Date: 202309262359
ISSUE DATE / TIME: 202309121404
ISSUE DATE / TIME: 202309141031
Unit Type and Rh: 7300
Unit Type and Rh: 7300

## 2022-08-23 DIAGNOSIS — M778 Other enthesopathies, not elsewhere classified: Secondary | ICD-10-CM | POA: Diagnosis not present

## 2022-08-23 DIAGNOSIS — M25512 Pain in left shoulder: Secondary | ICD-10-CM | POA: Diagnosis not present

## 2022-08-23 DIAGNOSIS — M62838 Other muscle spasm: Secondary | ICD-10-CM | POA: Diagnosis not present

## 2022-08-23 DIAGNOSIS — M19012 Primary osteoarthritis, left shoulder: Secondary | ICD-10-CM | POA: Diagnosis not present

## 2022-08-23 DIAGNOSIS — G8929 Other chronic pain: Secondary | ICD-10-CM | POA: Diagnosis not present

## 2022-08-30 ENCOUNTER — Encounter: Payer: Self-pay | Admitting: Family Medicine

## 2022-08-30 ENCOUNTER — Ambulatory Visit (INDEPENDENT_AMBULATORY_CARE_PROVIDER_SITE_OTHER): Payer: Medicare Other | Admitting: Family Medicine

## 2022-08-30 VITALS — BP 128/60 | HR 76 | Ht 62.0 in | Wt 204.8 lb

## 2022-08-30 DIAGNOSIS — I10 Essential (primary) hypertension: Secondary | ICD-10-CM | POA: Diagnosis not present

## 2022-08-30 DIAGNOSIS — K922 Gastrointestinal hemorrhage, unspecified: Secondary | ICD-10-CM

## 2022-08-30 DIAGNOSIS — D62 Acute posthemorrhagic anemia: Secondary | ICD-10-CM

## 2022-08-30 DIAGNOSIS — K279 Peptic ulcer, site unspecified, unspecified as acute or chronic, without hemorrhage or perforation: Secondary | ICD-10-CM | POA: Diagnosis not present

## 2022-08-30 MED ORDER — AMLODIPINE BESYLATE 5 MG PO TABS: 5.0000 mg | ORAL_TABLET | Freq: Every day | ORAL | 3 refills | Status: DC

## 2022-08-30 NOTE — Progress Notes (Unsigned)
Subjective:    Patient ID: Cheryl Payne, female    DOB: [birth year], 56 y.o.   MRN: [id number]  Cheryl Payne is a 56 y.o. female presenting on 08/30/2022 for Hospitalization Follow-up   HPI  HOSPITAL FOLLOW-UP VISIT  Hospital/Location: [place] Date of Admission: 08/16/22 Date of Discharge: 08/18/22 Transitions of care telephone call: 08/18/22 by Kirke Shaggy LPN  Reason for Admission: PUD, GI [facility] H&P and Discharge Summary have been reviewed - Patient presents today 12 days after recent hospitalization. Brief summary of recent course, patient had symptoms of hematemesis and dark stools with PUD, hospitalized, EGD done with multiple gastric ulcers thought due to NSAID Meloxicam use, discharged on Omeprazole 40mg  daily. Follow up with Dr Vicente Males GI for repeat EGD endoscopy in 8 weeks approx, also received PRBC Blood Transfusion in hospital with Hgb 7, previously was at Hgb 14. BP was low with hypotension, and advised to hold Amlodipine at that time AKI with dehydration, repeat Creatinine improved on discharge  - Today reports overall has done well after discharge. Symptoms of bleeding have resolved. No further dark stools, no bleeding no hematemesis. Admits persistent fatigue, weakness generalized.  - New medications on discharge: Omeprazole 40mg  daily - Changes to current meds on discharge: HOLD Amlodipine 5mg  daily  I have reviewed the discharge medication list, and have reconciled the current and discharge medications today.  Updates - [facility] - Dr Candelaria Stagers - 08/23/22 - started on Muscle relaxant for the muscle, and not as much the bone.   Current Outpatient Medications:    escitalopram (LEXAPRO) 10 MG tablet, Take 1 tablet (10 mg total) by mouth at bedtime. Home med., Disp: , Rfl:    gabapentin (NEURONTIN) 300 MG capsule, Take 2 capsules (600 mg total) by mouth at bedtime. Take 2 capsules (600mg ) by mouth at bedtime - may take 1 additional capsule (300mg ) by  mouth daily as needed for pain.  Home med., Disp: , Rfl:    omeprazole (PRILOSEC) 40 MG capsule, Take 1 capsule (40 mg total) by mouth daily., Disp: 30 capsule, Rfl: 2   rosuvastatin (CRESTOR) 10 MG tablet, TAKE 1 TABLET(10 MG) BY MOUTH DAILY, Disp: 90 tablet, Rfl: 0   tiZANidine (ZANAFLEX) 4 MG tablet, Take 4 mg by mouth at bedtime., Disp: , Rfl:    amLODipine (NORVASC) 5 MG tablet, Take 1 tablet (5 mg total) by mouth daily. (Note this medication has been temporarily HELD, if BP increases in future, please fill and start taking), Disp: 90 tablet, Rfl: 3  ------------------------------------------------------------------------- Social History   Tobacco Use   Smoking status: Former    Packs/day: 1.00    Years: 35.00    Total pack years: 35.00    Types: Cigarettes   Smokeless tobacco: Former    Quit date: 06/04/2018  Vaping Use   Vaping Use: Never used  Substance Use Topics   Alcohol use: Never   Drug use: Never    Review of Systems Per HPI unless specifically indicated above     Objective:    BP 128/60   Pulse 76   Ht 5\' 2"  (1.575 m)   Wt 204 lb 12.8 oz (92.9 kg)   SpO2 100%   BMI 37.46 kg/m   Wt Readings from Last 3 Encounters:  08/30/22 204 lb 12.8 oz (92.9 kg)  08/16/22 210 lb (95.3 kg)  07/15/22 209 lb (94.8 kg)    Physical Exam*** Results for orders placed or performed during the hospital encounter of 08/16/22  CBC with  Differential  Result Value Ref Range   WBC 15.5 (H) 4.0 - 10.5 K/uL   RBC 3.03 (L) 3.87 - 5.11 MIL/uL   Hemoglobin 9.3 (L) 12.0 - 15.0 g/dL   HCT 28.9 (L) 36.0 - 46.0 %   MCV 95.4 80.0 - 100.0 fL   MCH 30.7 26.0 - 34.0 pg   MCHC 32.2 30.0 - 36.0 g/dL   RDW 16.9 (H) 11.5 - 15.5 %   Platelets 224 150 - 400 K/uL   nRBC 1.9 (H) 0.0 - 0.2 %   Neutrophils Relative % 64 %   Neutro Abs 9.9 (H) 1.7 - 7.7 K/uL   Lymphocytes Relative 26 %   Lymphs Abs 4.0 0.7 - 4.0 K/uL   Monocytes Relative 9 %   Monocytes Absolute 1.4 (H) 0.1 - 1.0 K/uL    Eosinophils Relative 0 %   Eosinophils Absolute 0.0 0.0 - 0.5 K/uL   Basophils Relative 0 %   Basophils Absolute 0.1 0.0 - 0.1 K/uL   Immature Granulocytes 1 %   Abs Immature Granulocytes 0.22 (H) 0.00 - 0.07 K/uL  Comprehensive metabolic panel  Result Value Ref Range   Sodium 139 135 - 145 mmol/L   Potassium 3.9 3.5 - 5.1 mmol/L   Chloride 107 98 - 111 mmol/L   CO2 24 22 - 32 mmol/L   Glucose, Bld 157 (H) 70 - 99 mg/dL   BUN 69 (H) 6 - 20 mg/dL   Creatinine, Ser 1.02 (H) 0.44 - 1.00 mg/dL   Calcium 8.7 (L) 8.9 - 10.3 mg/dL   Total Protein 6.4 (L) 6.5 - 8.1 g/dL   Albumin 3.5 3.5 - 5.0 g/dL   AST 16 15 - 41 U/L   ALT 11 0 - 44 U/L   Alkaline Phosphatase 61 38 - 126 U/L   Total Bilirubin 0.6 0.3 - 1.2 mg/dL   GFR, Estimated >60 >60 mL/min   Anion gap 8 5 - 15  Lactic acid, plasma  Result Value Ref Range   Lactic Acid, Venous 3.4 (HH) 0.5 - 1.9 mmol/L  Lactic acid, plasma  Result Value Ref Range   Lactic Acid, Venous 2.1 (HH) 0.5 - 1.9 mmol/L  Lipase, blood  Result Value Ref Range   Lipase 24 11 - 51 U/L  HIV Antibody (routine testing w rflx)  Result Value Ref Range   HIV Screen 4th Generation wRfx Non Reactive Non Reactive  Basic metabolic panel  Result Value Ref Range   Sodium 142 135 - 145 mmol/L   Potassium 3.7 3.5 - 5.1 mmol/L   Chloride 112 (H) 98 - 111 mmol/L   CO2 26 22 - 32 mmol/L   Glucose, Bld 102 (H) 70 - 99 mg/dL   BUN 33 (H) 6 - 20 mg/dL   Creatinine, Ser 0.84 0.44 - 1.00 mg/dL   Calcium 8.0 (L) 8.9 - 10.3 mg/dL   GFR, Estimated >60 >60 mL/min   Anion gap 4 (L) 5 - 15  CBC  Result Value Ref Range   WBC 6.3 4.0 - 10.5 K/uL   RBC 2.46 (L) 3.87 - 5.11 MIL/uL   Hemoglobin 7.6 (L) 12.0 - 15.0 g/dL   HCT 23.3 (L) 36.0 - 46.0 %   MCV 94.7 80.0 - 100.0 fL   MCH 30.9 26.0 - 34.0 pg   MCHC 32.6 30.0 - 36.0 g/dL   RDW 17.3 (H) 11.5 - 15.5 %   Platelets 146 (L) 150 - 400 K/uL   nRBC 1.0 (H) 0.0 - 0.2 %  Basic metabolic panel  Result Value Ref Range    Sodium 143 135 - 145 mmol/L   Potassium 3.6 3.5 - 5.1 mmol/L   Chloride 114 (H) 98 - 111 mmol/L   CO2 25 22 - 32 mmol/L   Glucose, Bld 101 (H) 70 - 99 mg/dL   BUN 15 6 - 20 mg/dL   Creatinine, Ser 0.62 0.44 - 1.00 mg/dL   Calcium 7.9 (L) 8.9 - 10.3 mg/dL   GFR, Estimated >60 >60 mL/min   Anion gap 4 (L) 5 - 15  CBC  Result Value Ref Range   WBC 6.3 4.0 - 10.5 K/uL   RBC 2.27 (L) 3.87 - 5.11 MIL/uL   Hemoglobin 7.2 (L) 12.0 - 15.0 g/dL   HCT 21.5 (L) 36.0 - 46.0 %   MCV 94.7 80.0 - 100.0 fL   MCH 31.7 26.0 - 34.0 pg   MCHC 33.5 30.0 - 36.0 g/dL   RDW 17.0 (H) 11.5 - 15.5 %   Platelets 130 (L) 150 - 400 K/uL   nRBC 0.5 (H) 0.0 - 0.2 %  Magnesium  Result Value Ref Range   Magnesium 2.1 1.7 - 2.4 mg/dL  Type and screen [place]  Result Value Ref Range   ABO/RH(D) B POS    Antibody Screen NEG    Sample Expiration 08/19/2022,2359    Unit Number [id number]    Blood Component Type RED CELLS,LR    Unit division 00    Status of Unit ISSUED,FINAL    Transfusion Status OK TO TRANSFUSE    Crossmatch Result Compatible    Unit Number [id number]    Blood Component Type RBC, LR IRR    Unit division 00    Status of Unit ISSUED,FINAL    Transfusion Status OK TO TRANSFUSE    Crossmatch Result      Compatible Performed at [facility], [address] [place], [place] [address]   Prepare RBC (crossmatch)  Result Value Ref Range   Order Confirmation      ORDER PROCESSED BY BLOOD BANK Performed at [facility], [address] [place], [place] [address]   ABO/Rh  Result Value Ref Range   ABO/RH(D)      B POS Performed at [facility], [address] [place], [place] [address]   Prepare RBC (crossmatch)  Result Value Ref Range   Order Confirmation      ORDER PROCESSED BY BLOOD BANK Performed at [facility], [address], [place], [place] [address]   Surgical pathology  Result Value Ref Range   SURGICAL  PATHOLOGY      SURGICAL PATHOLOGY CASE: [id number] PATIENT: [name] Surgical Pathology Report     Specimen Submitted: A. Duodenum; cbx B. Stomach; cbx C. GEJ esophageal nodule; cbx  Clinical History: Hematemesis.  Duodenitis, stomach ulcers, esophageal nodule, gastritis    DIAGNOSIS: A. DUODENUM; COLD BIOPSY: - ENTERIC MUCOSA WITH FEATURES OF PEPTIC DUODENITIS. - NEGATIVE FOR FEATURES OF CELIAC, DYSPLASIA, AND MALIGNANCY.  B. STOMACH; COLD BIOPSY: - GASTRIC ANTRAL MUCOSA WITH REACTIVE GASTROPATHY. - NEGATIVE FOR H. PYLORI, DYSPLASIA, AND MALIGNANCY.  Comment: The differential diagnosis for these findings includes drug/chemical injury (NSAID vs. other), bile reflux, and changes adjacent to an area of healing ulceration. Clinical correlation with endoscopic findings is required.  C. GASTROESOPHAGEAL NODULE; COLD BIOPSY: - SQUAMOCOLUMNAR MUCOSA WITH FEATURES OF REFLUX GASTROESOPHAGITIS AND REACTIVE/POLYPOID FOVEOLAR HYPERPLASIA. - NEGATIVE FOR INTESTINAL ME TAPLASIA, DYSPLASIA OR MALIGNANCY.  GROSS DESCRIPTION: A. Labeled:  cbx duodenum duodenitis Received: Formalin Collection time: 8:57 AM on 08/17/2022 Placed into formalin time: 8:57 AM on 08/17/2022 Tissue fragment(s): 1 Size: 0.6 x 0.3 x 0.2 cm Description: Tan-pink soft tissue fragment Entirely submitted in 1 cassette.  B. Labeled: cbx stomach gastritis Received: Formalin Collection time: 8:59 AM on 08/17/2022 Placed into formalin time: 8:59 AM on 08/17/2022 Tissue fragment(s): 2 Size: Range from 0.4-0.6 cm Description: Tan-pink soft tissue fragments Entirely submitted in 1 cassette.  C. Labeled: cbx GE junction esophageal nodule Received: Formalin Collection time: 9 AM on 08/17/2022 Placed into formalin time: 9 AM on 08/17/2022 Tissue fragment(s): Multiple Size: Aggregate, 1 x 0.3 x 0.2 cm Description: Received are 2 fragments of tan soft tissue and multiple fragments of white translucent soft  tissue.  The translucent soft tissue fragments may not survive proce ssing. Entirely submitted in 1 cassette.  RB 08/17/2022  Final Diagnosis performed by Allena Napoleon, MD.   Electronically signed 08/18/2022 8:38:16AM The electronic signature indicates that the named Attending Pathologist has evaluated the specimen Technical component performed at [facility], [address] [place], [place] [address] Lab: [phone] Dir: Rush Farmer, MD, MMM  Professional component performed at [facility], [facility], [place], [place], [place] [address] Lab: [phone] Dir: Kathi Simpers, MD   BPAM [facility]  Result Value Ref Range   ISSUE DATE / TIME [id number]    Blood Product Unit Number [id number]    PRODUCT CODE V6945W38    Unit Type and Rh 7300    Blood Product Expiration Date 882800349179    ISSUE DATE / TIME [id number]    Blood Product Unit Number [id number]    PRODUCT CODE [id number]    Unit Type and Rh 7300    Blood Product Expiration Date [id number]   Troponin I (High Sensitivity)  Result Value Ref Range   Troponin I (High Sensitivity) 29 (H) <18 ng/L  Troponin I (High Sensitivity)  Result Value Ref Range   Troponin I (High Sensitivity) 39 (H) <18 ng/L      Assessment & Plan:   Problem List Items Addressed This Visit     Essential hypertension   Relevant Medications   amLODipine (NORVASC) 5 MG tablet   Other Relevant Orders   BASIC METABOLIC PANEL WITH GFR   Upper GI bleed - Primary   Relevant Orders   CBC with Differential/Platelet   BASIC METABOLIC PANEL WITH GFR   Other Visit Diagnoses     PUD (peptic ulcer disease)       Relevant Orders   BASIC METABOLIC PANEL WITH GFR   ABLA (acute blood loss anemia)       Relevant Orders   CBC with Differential/Platelet       ***remain off Meloxicam / NSAIDs (Advil, Ibuprofen, Aleve, BC Goody)  Meds ordered this encounter  Medications   amLODipine (NORVASC) 5 MG tablet    Sig: Take 1  tablet (5 mg total) by mouth daily. (Note this medication has been temporarily HELD, if BP increases in future, please fill and start taking)    Dispense:  90 tablet    Refill:  3    Follow up plan: Return in about 3 months (around 11/29/2022) for 3 month follow-up HTN (?restart med), GI update bleed/anemia.   Nobie Putnam, [facility] 08/30/2022, 10:45 AM

## 2022-08-30 NOTE — Patient Instructions (Addendum)
Thank you for coming to the office today.  Labs today to follow up on the blood loss and anemia. Also check kidney function.  Recommend a Vitamin Supplement that includes Iron (325mg  or 65 FE) 1 per day plus vitamin C  Remain off Amlodipine 5mg  daily - for a while longer. Keep track of BP. Can space out to 3 times a week or so on BP checks. IF readings >140/90 then we will need to restart the Amlodipine. But for now can remain off.  Keep on Omeprazole 40mg  daily for several months, until follow up with Dr Vicente Males, and repeat the Endoscopy.   Please schedule a Follow-up Appointment to: Return in about 3 months (around 11/29/2022) for 3 month follow-up HTN (?restart med), GI update bleed/anemia.  If you have any other questions or concerns, please feel free to call the office or send a message through [place]. You may also schedule an earlier appointment if necessary.  Additionally, you may be receiving a survey about your experience at our office within a few days to 1 week by e-mail or mail. We value your feedback.  Cheryl Putnam, DO [place]

## 2022-08-31 LAB — BASIC METABOLIC PANEL WITH GFR
BUN: 7 mg/dL (ref 7–25)
CO2: 28 mmol/L (ref 20–32)
Calcium: 9.3 mg/dL (ref 8.6–10.4)
Chloride: 103 mmol/L (ref 98–110)
Creat: 0.81 mg/dL (ref 0.50–1.03)
Glucose, Bld: 90 mg/dL (ref 65–139)
Potassium: 4.3 mmol/L (ref 3.5–5.3)
Sodium: 140 mmol/L (ref 135–146)
eGFR: 85 mL/min/{1.73_m2} (ref >=60)

## 2022-08-31 LAB — CBC WITH DIFFERENTIAL/PLATELET
Absolute Monocytes: 632 cells/uL (ref 200–950)
Basophils Absolute: 39 cells/uL (ref 0–200)
Basophils Relative: 0.5 %
Eosinophils Absolute: 39 cells/uL (ref 15–500)
Eosinophils Relative: 0.5 %
HCT: 31.6 % — ABNORMAL LOW (ref 35.0–45.0)
Hemoglobin: 9.9 g/dL — ABNORMAL LOW (ref 11.7–15.5)
Lymphs Abs: 2753 cells/uL (ref 850–3900)
MCH: 29.3 pg (ref 27.0–33.0)
MCHC: 31.3 g/dL — ABNORMAL LOW (ref 32.0–36.0)
MCV: 93.5 fL (ref 80.0–100.0)
MPV: 10.5 fL (ref 7.5–12.5)
Monocytes Relative: 8.1 %
Neutro Abs: 4337 cells/uL (ref 1500–7800)
Neutrophils Relative %: 55.6 %
Platelets: 372 10*3/uL (ref 140–400)
RBC: 3.38 10*6/uL — ABNORMAL LOW (ref 3.80–5.10)
RDW: 16.7 % — ABNORMAL HIGH (ref 11.0–15.0)
Total Lymphocyte: 35.3 %
WBC: 7.8 10*3/uL (ref 3.8–10.8)

## 2022-09-15 ENCOUNTER — Other Ambulatory Visit: Payer: Self-pay | Admitting: Family Medicine

## 2022-09-15 DIAGNOSIS — I10 Essential (primary) hypertension: Secondary | ICD-10-CM

## 2022-09-15 NOTE — Telephone Encounter (Signed)
Unable to refill per protocol, Rx expired. Medication was discontinued 08/18/22 due to stop taking at discharge. Will refuse request.  Requested Prescriptions  Pending Prescriptions Disp Refills  . metoprolol tartrate (LOPRESSOR) 25 MG tablet [Pharmacy Med Name: METOPROLOL TARTRATE 25MG  TABLETS] 180 tablet 0    Sig: TAKE 1 TABLET(25 MG) BY MOUTH TWICE DAILY     Cardiovascular:  Beta Blockers Passed - 09/15/2022  7:59 AM      Passed - Last BP in normal range    BP Readings from Last 1 Encounters:  08/30/22 128/60         Passed - Last Heart Rate in normal range    Pulse Readings from Last 1 Encounters:  08/30/22 76         Passed - Valid encounter within last 6 months    Recent Outpatient Visits          2 weeks ago Upper GI bleed   [place], DO   6 months ago Chronic left shoulder pain   [place], DO   8 months ago Vaginal discharge   [place], DO   11 months ago Chronic left-sided low back pain with left-sided sciatica   [place], DO   1 year ago Annual physical exam   [place], DO      Future Appointments            In 2 months [place], Devonne Doughty, [facility], [facility]

## 2022-09-29 ENCOUNTER — Other Ambulatory Visit: Payer: Self-pay | Admitting: Family Medicine

## 2022-09-29 DIAGNOSIS — I10 Essential (primary) hypertension: Secondary | ICD-10-CM

## 2022-09-29 DIAGNOSIS — R7303 Prediabetes: Secondary | ICD-10-CM

## 2022-09-29 DIAGNOSIS — J432 Centrilobular emphysema: Secondary | ICD-10-CM

## 2022-10-05 ENCOUNTER — Encounter: Payer: Self-pay | Admitting: Gastroenterology

## 2022-10-05 NOTE — Progress Notes (Signed)
in

## 2022-10-06 ENCOUNTER — Telehealth: Payer: Medicare Other

## 2022-10-06 ENCOUNTER — Telehealth: Payer: Self-pay

## 2022-10-06 ENCOUNTER — Ambulatory Visit (INDEPENDENT_AMBULATORY_CARE_PROVIDER_SITE_OTHER): Payer: Medicare Other

## 2022-10-06 DIAGNOSIS — J432 Centrilobular emphysema: Secondary | ICD-10-CM

## 2022-10-06 DIAGNOSIS — R7303 Prediabetes: Secondary | ICD-10-CM

## 2022-10-06 DIAGNOSIS — I10 Essential (primary) hypertension: Secondary | ICD-10-CM

## 2022-10-06 NOTE — Chronic Care Management (AMB) (Signed)
Chronic Care Management   CCM RN Visit Note  10/06/2022 Name: Cheryl Payne MRN: [id number] DOB: [birth year]  Subjective: Cheryl Payne is a 56 y.o. year old female who is a primary care patient of [name], DO. The patient was referred to the Chronic Care Management team for assistance with care management needs subsequent to provider initiation of CCM services and plan of care.    Today's Visit:  Engaged with patient by telephone for initial visit.     SDOH Interventions Today    Flowsheet Row Most Recent Value  SDOH Interventions   Utilities Interventions Intervention Not Indicated         Goals Addressed             This Visit's Progress    CCM Expected Outcome:  Monitor, Self-Manage and Reduce Symptoms of: Pre-Diabetes       Current Barriers:  Knowledge Deficits related to dietary changes to help with managing pre-DM Chronic Disease Management support and education needs related to effective management of pre-DM with hospitalization for GI bleed in September   Planned Interventions: Provided education to patient about basic DM disease process; Reviewed prescribed diet with patient heart healthy/ADA diet ; Counseled on importance of regular laboratory monitoring as prescribed;        Discussed plans with patient for ongoing care management follow up and provided patient with direct contact information for care management team;      Provided patient with written educational materials related to hypo and hyperglycemia and importance of correct treatment;       Reviewed scheduled/upcoming provider appointments including: 12-02-2022 at 8 am;         call provider for findings outside established parameters;       Review of patient status, including review of consultants reports, relevant laboratory and other test results, and medications completed;       Advised patient to discuss changes that would warrant taking medications for DM, questions or concerns  related to effective management of DM with provider;      The patient was in the hospital for a GI bleed in September. Review of the letter the patient received from Dr. Vicente Males concerning her EGD and biopsy report. The patient has a call into the GI doctor to see about repeat EGD since it is coming up on the 8 week mark post EGD and needs a follow up with GI doctor and per instructions from the discharge summary the patient is to have a repeat EGD due to bleeding  ulcers and medications management plan of care. Will continue to monitor.   Symptom Management: Attend all scheduled provider appointments Call provider office for new concerns or questions  call the Suicide and Crisis Lifeline: 988 call the [place] National Suicide Prevention Lifeline: 573-590-5345 or TTY: 802-085-7054 TTY (815) 623-9902) to talk to a trained counselor call 1-800-273-TALK (toll free, 24 hour hotline) if experiencing a Mental Health or [place]  check feet daily for cuts, sores or redness trim toenails straight across manage portion size wash and dry feet carefully every day wear comfortable, cotton socks wear comfortable, well-fitting shoes  Follow Up Plan: Telephone follow up appointment with care management team member scheduled for: 12-28-2022 at 9 am       CCM Expected Outcome:  Monitor, Self-Manage, and Reduce Symptoms of Hypertension       Current Barriers:  Knowledge Deficits related to controlling blood pressures and monitoring for changes in blood pressure Care Coordination needs related  to knowing how to effectively manage blood pressures and calling the office for changes in blood pressures and when to take medications and when to hold in a patient with HTN Chronic Disease Management support and education needs related to effective management of HTN  Planned Interventions: Evaluation of current treatment plan related to hypertension self management and patient's adherence to plan as  established by provider;   Provided education to patient re: stroke prevention, s/s of heart attack and stroke; Reviewed prescribed diet heart healthy diet, avoiding spicy foods due to recent GI Bleed Reviewed medications with patient and discussed importance of compliance;  Discussed plans with patient for ongoing care management follow up and provided patient with direct contact information for care management team; Advised patient, providing education and rationale, to monitor blood pressure daily and record, calling PCP for findings outside established parameters;  Reviewed scheduled/upcoming provider appointments including:  Advised patient to discuss changes in blood pressures that warrant changes in medications or plan of care with provider; Provided education on prescribed diet heart healthy diet, avoiding spicy foods;  Discussed complications of poorly controlled blood pressure such as heart disease, stroke, circulatory complications, vision complications, kidney impairment, sexual dysfunction;  Screening for signs and symptoms of depression related to chronic disease state;  Assessed social determinant of health barriers;   Symptom Management: Take medications as prescribed   Attend all scheduled provider appointments Call pharmacy for medication refills 3-7 days in advance of running out of medications Call provider office for new concerns or questions  call the Suicide and Crisis Lifeline: 988 call the [place] National Suicide Prevention Lifeline: 906-884-6700 or TTY: (385)204-5839 TTY 254 778 2805) to talk to a trained counselor call 1-800-273-TALK (toll free, 24 hour hotline) if experiencing a Mental Health or [place]   Follow Up Plan: Telephone follow up appointment with care management team member scheduled for: 12-08-2022 at 0900 am       CCM:  Maintain, Monitor and Self-Manage Symptoms of COPD       Current Barriers:  Knowledge Deficits related to triggers  and factors that may cause COPD Exacerbation Chronic Disease Management support and education needs related to COPD  Planned Interventions: Provided patient with basic written and verbal COPD education on self care/management/and exacerbation prevention Advised patient to track and manage COPD triggers Provided written and verbal instructions on pursed lip breathing and utilized returned demonstration as teach back Advised patient to self assesses COPD action plan zone and make appointment with provider if in the yellow zone for 48 hours without improvement Advised patient to engage in light exercise as tolerated 3-5 days a week to aid in the the management of COPD Provided education about and advised patient to utilize infection prevention strategies to reduce risk of respiratory infection Discussed the importance of adequate rest and management of fatigue with COPD Screening for signs and symptoms of depression related to chronic disease state  Assessed social determinant of health barriers  Symptom Management: Attend all scheduled provider appointments Call provider office for new concerns or questions  call the Suicide and Crisis Lifeline: 988 call the [place] National Suicide Prevention Lifeline: 2367200172 or TTY: 225 767 5594 TTY 407-659-7395) to talk to a trained counselor call 1-800-273-TALK (toll free, 24 hour hotline) if experiencing a [place] or [place]  avoid second hand smoke eliminate smoking in my home identify and remove indoor air pollutants limit outdoor activity during cold weather listen for public air quality announcements every day develop a rescue plan eliminate  symptom triggers at home follow rescue plan if symptoms flare-up develop a new routine to improve sleep get at least 7 to 8 hours of sleep at night  Follow Up Plan: Telephone follow up appointment with care management team member scheduled for: 12-08-2022 at 9 am        COMPLETED: RNCM: Management of chronic pain       Care Coordination Interventions: Closing this goal- see new CCM 07-14-2022: Patient rates her pain level at a 4 today on a scale of 0-10. The patient states it is in her left shoulder and hurts worse at night Reviewed provider established plan for pain management. 07-14-2022: The patient has not seen a specialist concerning her left shoulder pain but has went to [facility] for back pain that is now resolved. Will collaborate with the pcp for assistance if the patient needs a new referral for left shoulder pain. Education and support given Discussed importance of adherence to all scheduled medical appointments. The patient has an appointment this afternoon with her oncologist in [place]. She states she had blood work recently. Denies any acute needs at this time. Counseled on the importance of reporting any/all new or changed pain symptoms or management strategies to pain management provider Advised patient to report to care team affect of pain on daily activities Discussed use of relaxation techniques and/or diversional activities to assist with pain reduction (distraction, imagery, relaxation, massage, acupressure, TENS, heat, and cold application. 07-14-2022: Education on heat or ice therapy to the shoulder to see if that would help with her pain and discomfort. The patient stats mainly at night she is having issues. Education on applying heat or cold to the area for 20 minutes on and then 20 minutes off cycle. The patient verbalized understanding. Reviewed with patient prescribed pharmacological and nonpharmacological pain relief strategies Advised patient to discuss unresolved pain, changes in level or intensity of pain with provider Screening for signs and symptoms of depression related to chronic disease state  Assessed social determinant of health barriers AWV has not been done yet for 2023. Email sent to the scheduler to get AWV scheduled. Reviewed with  the patient.   Heat Therapy Heat therapy can help ease sore, stiff, injured, and tight muscles and joints. Heat relaxes your muscles. This may help ease your pain and muscle spasms. What are the risks? If you have any of the following conditions, do not use heat therapy unless your doctor says it is okay. These conditions include: New bruises. Open wounds. Any of these in the area being treated: Healing wounds. Infected skin. Scarred skin. Problems with how blood moves through your body (circulation). Loss of feeling (numbness) in the part of your body that is being treated. Unusual swelling of the part of the body that is being treated. Blood clots. Diabetes. Heart disease. Cancer. Not being able to communicate pain. This may include young children and people who have problems with their brain function (dementia). How to use heat therapy There are different kinds of heat therapy. These include: Moist heat pack. Hot water bottle. Electric heating pad. Heated gel pack. Heated wrap. Warm water bath. Your doctor will tell you how to use heat therapy. In general, you should: Place a towel between your skin and the heat source. Leave the heat on for 20-30 minutes. Your skin may turn pink. Take off the heat if your skin turns bright red. This is very important. If you cannot feel pain, heat, or cold, you have a greater risk of  getting burned. Your doctor may also tell you to take a warm water bath. To do this: Put a non-slip pad in the bathtub to prevent a fall. Fill the bathtub with warm water. Check the water temperature. Soak in the water for 15-20 minutes, or as told by your doctor. Be careful when you stand up after the bath. You may feel dizzy. Pat yourself dry after the bath. Do not rub your skin to dry it. General recommendations for heat therapy Be careful not to burn your skin when using heat therapy. High heat or using heat for a long time can cause burns. Do not sleep  while using heat therapy. Only use heat therapy while you are awake. Check your skin during heat therapy. Do not use heat therapy if you have a new injury, especially if you have swelling on the injured area. Do not use heat therapy on areas of your skin that are already irritated, such as with a rash or sunburn. Do not use heat therapy if your skin turns bright red. Contact a doctor if: You have blisters, redness, swelling, or loss of feeling in the area where you use heat therapy. You have new pain. You have pain that gets worse. Summary Heat therapy is the use of heat to help ease sore, stiff, injured, and tight muscles and joints. There are different types of heat therapy. Your doctor will tell you which one to use. Only use heat therapy while you are awake. Watch your skin to make sure you do not get burned while using heat therapy. This information is not intended to replace advice given to you by your health care provider. Make sure you discuss any questions you have with your health care provider. Document Revised: 09/23/2020 Document Reviewed: 09/23/2020 Elsevier Patient Education  [place] for Routine Care of Injuries Many injuries can be cared for with rest, ice, compression, and elevation (RICE therapy). This includes: Resting the injured body part. Putting ice on the injury. Putting pressure (compression) on the injury. Raising the injured part (elevation). Using RICE therapy can help to lessen pain and swelling. Supplies needed: Ice. Plastic bag. Towel. Elastic bandage. Pillow or pillows to raise your injured body part. How to care for your injury with RICE therapy Rest Try to rest the injured part of your body. You can go back to your normal activities when your doctor says it is okay to do them and when you can do them without pain. If you rest the injury too much, it may not heal as well. Some injuries heal better with early movement  instead of resting for too long. Ask your doctor if you should do exercises to help your injury get better. Ice  If told, put ice on the injured area. To do this: Put ice in a plastic bag. Place a towel between your skin and the bag. Leave the ice on for 20 minutes, 2-3 times a day. Take off the ice if your skin turns bright red. This is very important. If you cannot feel pain, heat, or cold, you have a greater risk of damage to the area. Do not put ice on your bare skin. Use ice for as many days as your doctor tells you to use it. Compression Put pressure on the injured area. This can be done with an elastic bandage. If this type of bandage has been put on your injury: Follow instructions on the package the bandage came in about how  to use it. Do not wrap the bandage too tightly. Wrap the bandage more loosely if part of your body beyond the bandage is blue, swollen, cold, painful, or loses feeling. Take off the bandage and put it on again every 3-4 hours or as told by your doctor. See your doctor if the bandage seems to make your problems worse.  Elevation Raise the injured area above the level of your heart while you are sitting or lying down. Follow these instructions at home: If your symptoms get worse or last a long time, make a follow-up appointment with your doctor. You may need to have imaging tests, such as X-rays or an MRI. If you have imaging tests, ask how to get your results when they are ready. Return to your normal activities when your doctor says that it is safe. Keep all follow-up visits. Contact a doctor if: You keep having pain and swelling. Your symptoms get worse. Get help right away if: You have sudden, very bad pain at your injury or lower than your injury. You have redness or more swelling around your injury. You have tingling or numbness at your injury or lower than your injury, and it does not go away when you take off the bandage. Summary Many injuries can  be cared for using rest, ice, compression, and elevation (RICE therapy). You can go back to your normal activities when your doctor says it is okay and when you can do them without pain. Put ice on the injured area as told by your doctor. Get help if your symptoms get worse or if you keep having pain and swelling. This information is not intended to replace advice given to you by your health care provider. Make sure you discuss any questions you have with your health care provider. Document Revised: 09/10/2020 Document Reviewed: 09/10/2020 Elsevier Patient Education  [place]: Need help with paperwork       Care Coordination Interventions: Closing goal-goal met. Also see new plan of care for CCM Patient called to talk with [facility] about paperwork she needed signed from the pcp. Education and support given Advised patient to wait for Rachell to call the patient back and let her know if the paperwork she had sent to the office for the pcp to sign has come to the office fax.  Provided education to patient re: getting Dr. Parks Ranger to sign the paperwork and would get it faxed back to the patient at her apartment complex for expressed needs.  Collaborated with office staff regarding paperwork faxed to the [facility] attention that needed to be reviewed by Dr. Parks Ranger and signed. The paperwork has been received and Rachell will give to Dr. Parks Ranger to signing and return fax to the apartment complex Discussed plans with patient for ongoing care management follow up and provided patient with direct contact information for care management team Advised patient to discuss new concerns and changes with provider           Plan:Telephone follow up appointment with care management team member scheduled for:  12-08-2022 at 9 am  [place], MSN, CCM RN Care Manager  Chronic Care Management Direct Number: [phone]

## 2022-10-06 NOTE — Patient Instructions (Signed)
Please call the care guide team at [phone] if you need to cancel or reschedule your appointment.   If you are experiencing a Mental Health or [place] or need someone to talk to, please call the Suicide and Crisis Lifeline: 988 call the [place] National Suicide Prevention Lifeline: (272)541-1353 or TTY: (604)220-0281 TTY (872) 448-9901) to talk to a trained counselor call 1-800-273-TALK (toll free, 24 hour hotline)   Following is a copy of your full provider care plan:   Goals Addressed             This Visit's Progress    CCM Expected Outcome:  Monitor, Self-Manage and Reduce Symptoms of: Pre-Diabetes       Current Barriers:  Knowledge Deficits related to dietary changes to help with managing pre-DM Chronic Disease Management support and education needs related to effective management of pre-DM with hospitalization for GI bleed in September   Planned Interventions: Provided education to patient about basic DM disease process; Reviewed prescribed diet with patient heart healthy/ADA diet ; Counseled on importance of regular laboratory monitoring as prescribed;        Discussed plans with patient for ongoing care management follow up and provided patient with direct contact information for care management team;      Provided patient with written educational materials related to hypo and hyperglycemia and importance of correct treatment;       Reviewed scheduled/upcoming provider appointments including: 12-02-2022 at 8 am;         call provider for findings outside established parameters;       Review of patient status, including review of consultants reports, relevant laboratory and other test results, and medications completed;       Advised patient to discuss changes that would warrant taking medications for DM, questions or concerns related to effective management of DM with provider;      The patient was in the hospital for a GI bleed in September. Review of the letter  the patient received from Dr. Vicente Males concerning her EGD and biopsy report. The patient has a call into the GI doctor to see about repeat EGD since it is coming up on the 8 week mark post EGD and needs a follow up with GI doctor and per instructions from the discharge summary the patient is to have a repeat EGD due to bleeding  ulcers and medications management plan of care. Will continue to monitor.   Symptom Management: Attend all scheduled provider appointments Call provider office for new concerns or questions  call the Suicide and Crisis Lifeline: 988 call the [place] National Suicide Prevention Lifeline: 360-188-5820 or TTY: (640)406-9269 TTY (564)652-7002) to talk to a trained counselor call 1-800-273-TALK (toll free, 24 hour hotline) if experiencing a Mental Health or [facility]  check feet daily for cuts, sores or redness trim toenails straight across manage portion size wash and dry feet carefully every day wear comfortable, cotton socks wear comfortable, well-fitting shoes  Follow Up Plan: Telephone follow up appointment with care management team member scheduled for: 12-28-2022 at 9 am       CCM Expected Outcome:  Monitor, Self-Manage, and Reduce Symptoms of Hypertension       Current Barriers:  Knowledge Deficits related to controlling blood pressures and monitoring for changes in blood pressure Care Coordination needs related to knowing how to effectively manage blood pressures and calling the office for changes in blood pressures and when to take medications and when to hold in a patient with  HTN Chronic Disease Management support and education needs related to effective management of HTN  Planned Interventions: Evaluation of current treatment plan related to hypertension self management and patient's adherence to plan as established by provider;   Provided education to patient re: stroke prevention, s/s of heart attack and stroke; Reviewed prescribed diet heart  healthy diet, avoiding spicy foods due to recent GI Bleed Reviewed medications with patient and discussed importance of compliance;  Discussed plans with patient for ongoing care management follow up and provided patient with direct contact information for care management team; Advised patient, providing education and rationale, to monitor blood pressure daily and record, calling PCP for findings outside established parameters;  Reviewed scheduled/upcoming provider appointments including:  Advised patient to discuss changes in blood pressures that warrant changes in medications or plan of care with provider; Provided education on prescribed diet heart healthy diet, avoiding spicy foods;  Discussed complications of poorly controlled blood pressure such as heart disease, stroke, circulatory complications, vision complications, kidney impairment, sexual dysfunction;  Screening for signs and symptoms of depression related to chronic disease state;  Assessed social determinant of health barriers;   Symptom Management: Take medications as prescribed   Attend all scheduled provider appointments Call pharmacy for medication refills 3-7 days in advance of running out of medications Call provider office for new concerns or questions  call the Suicide and Crisis Lifeline: 988 call the [place] National Suicide Prevention Lifeline: 704-218-9130 or TTY: (870) 273-7940 TTY 2152850219) to talk to a trained counselor call 1-800-273-TALK (toll free, 24 hour hotline) if experiencing a Mental Health or [place]   Follow Up Plan: Telephone follow up appointment with care management team member scheduled for: 12-08-2022 at 0900 am       CCM:  Maintain, Monitor and Self-Manage Symptoms of COPD       Current Barriers:  Knowledge Deficits related to triggers and factors that may cause COPD Exacerbation Chronic Disease Management support and education needs related to COPD  Planned  Interventions: Provided patient with basic written and verbal COPD education on self care/management/and exacerbation prevention Advised patient to track and manage COPD triggers Provided written and verbal instructions on pursed lip breathing and utilized returned demonstration as teach back Advised patient to self assesses COPD action plan zone and make appointment with provider if in the yellow zone for 48 hours without improvement Advised patient to engage in light exercise as tolerated 3-5 days a week to aid in the the management of COPD Provided education about and advised patient to utilize infection prevention strategies to reduce risk of respiratory infection Discussed the importance of adequate rest and management of fatigue with COPD Screening for signs and symptoms of depression related to chronic disease state  Assessed social determinant of health barriers  Symptom Management: Attend all scheduled provider appointments Call provider office for new concerns or questions  call the Suicide and Crisis Lifeline: 988 call the [place] National Suicide Prevention Lifeline: 214 841 8415 or TTY: (810) 494-8147 TTY 580-591-7942) to talk to a trained counselor call 1-800-273-TALK (toll free, 24 hour hotline) if experiencing a [place] or [place]  avoid second hand smoke eliminate smoking in my home identify and remove indoor air pollutants limit outdoor activity during cold weather listen for public air quality announcements every day develop a rescue plan eliminate symptom triggers at home follow rescue plan if symptoms flare-up develop a new routine to improve sleep get at least 7 to 8 hours of sleep at night  Follow  Up Plan: Telephone follow up appointment with care management team member scheduled for: 12-08-2022 at 9 am       COMPLETED: RNCM: Management of chronic pain       Care Coordination Interventions: Closing this goal- see new CCM 07-14-2022: Patient  rates her pain level at a 4 today on a scale of 0-10. The patient states it is in her left shoulder and hurts worse at night Reviewed provider established plan for pain management. 07-14-2022: The patient has not seen a specialist concerning her left shoulder pain but has went to [facility] for back pain that is now resolved. Will collaborate with the pcp for assistance if the patient needs a new referral for left shoulder pain. Education and support given Discussed importance of adherence to all scheduled medical appointments. The patient has an appointment this afternoon with her oncologist in [place]. She states she had blood work recently. Denies any acute needs at this time. Counseled on the importance of reporting any/all new or changed pain symptoms or management strategies to pain management provider Advised patient to report to care team affect of pain on daily activities Discussed use of relaxation techniques and/or diversional activities to assist with pain reduction (distraction, imagery, relaxation, massage, acupressure, TENS, heat, and cold application. 07-14-2022: Education on heat or ice therapy to the shoulder to see if that would help with her pain and discomfort. The patient stats mainly at night she is having issues. Education on applying heat or cold to the area for 20 minutes on and then 20 minutes off cycle. The patient verbalized understanding. Reviewed with patient prescribed pharmacological and nonpharmacological pain relief strategies Advised patient to discuss unresolved pain, changes in level or intensity of pain with provider Screening for signs and symptoms of depression related to chronic disease state  Assessed social determinant of health barriers AWV has not been done yet for 2023. Email sent to the scheduler to get AWV scheduled. Reviewed with the patient.   Heat Therapy Heat therapy can help ease sore, stiff, injured, and tight muscles and joints. Heat relaxes your muscles.  This may help ease your pain and muscle spasms. What are the risks? If you have any of the following conditions, do not use heat therapy unless your doctor says it is okay. These conditions include: New bruises. Open wounds. Any of these in the area being treated: Healing wounds. Infected skin. Scarred skin. Problems with how blood moves through your body (circulation). Loss of feeling (numbness) in the part of your body that is being treated. Unusual swelling of the part of the body that is being treated. Blood clots. Diabetes. Heart disease. Cancer. Not being able to communicate pain. This may include young children and people who have problems with their brain function (dementia). How to use heat therapy There are different kinds of heat therapy. These include: Moist heat pack. Hot water bottle. Electric heating pad. Heated gel pack. Heated wrap. Warm water bath. Your doctor will tell you how to use heat therapy. In general, you should: Place a towel between your skin and the heat source. Leave the heat on for 20-30 minutes. Your skin may turn pink. Take off the heat if your skin turns bright red. This is very important. If you cannot feel pain, heat, or cold, you have a greater risk of getting burned. Your doctor may also tell you to take a warm water bath. To do this: Put a non-slip pad in the bathtub to prevent a fall. Fill the  bathtub with warm water. Check the water temperature. Soak in the water for 15-20 minutes, or as told by your doctor. Be careful when you stand up after the bath. You may feel dizzy. Pat yourself dry after the bath. Do not rub your skin to dry it. General recommendations for heat therapy Be careful not to burn your skin when using heat therapy. High heat or using heat for a long time can cause burns. Do not sleep while using heat therapy. Only use heat therapy while you are awake. Check your skin during heat therapy. Do not use heat therapy if you  have a new injury, especially if you have swelling on the injured area. Do not use heat therapy on areas of your skin that are already irritated, such as with a rash or sunburn. Do not use heat therapy if your skin turns bright red. Contact a doctor if: You have blisters, redness, swelling, or loss of feeling in the area where you use heat therapy. You have new pain. You have pain that gets worse. Summary Heat therapy is the use of heat to help ease sore, stiff, injured, and tight muscles and joints. There are different types of heat therapy. Your doctor will tell you which one to use. Only use heat therapy while you are awake. Watch your skin to make sure you do not get burned while using heat therapy. This information is not intended to replace advice given to you by your health care provider. Make sure you discuss any questions you have with your health care provider. Document Revised: 09/23/2020 Document Reviewed: 09/23/2020 Elsevier Patient Education  [place] for Routine Care of Injuries Many injuries can be cared for with rest, ice, compression, and elevation (RICE therapy). This includes: Resting the injured body part. Putting ice on the injury. Putting pressure (compression) on the injury. Raising the injured part (elevation). Using RICE therapy can help to lessen pain and swelling. Supplies needed: Ice. Plastic bag. Towel. Elastic bandage. Pillow or pillows to raise your injured body part. How to care for your injury with RICE therapy Rest Try to rest the injured part of your body. You can go back to your normal activities when your doctor says it is okay to do them and when you can do them without pain. If you rest the injury too much, it may not heal as well. Some injuries heal better with early movement instead of resting for too long. Ask your doctor if you should do exercises to help your injury get better. Ice  If told, put ice on the  injured area. To do this: Put ice in a plastic bag. Place a towel between your skin and the bag. Leave the ice on for 20 minutes, 2-3 times a day. Take off the ice if your skin turns bright red. This is very important. If you cannot feel pain, heat, or cold, you have a greater risk of damage to the area. Do not put ice on your bare skin. Use ice for as many days as your doctor tells you to use it. Compression Put pressure on the injured area. This can be done with an elastic bandage. If this type of bandage has been put on your injury: Follow instructions on the package the bandage came in about how to use it. Do not wrap the bandage too tightly. Wrap the bandage more loosely if part of your body beyond the bandage is blue, swollen, cold, painful, or loses  feeling. Take off the bandage and put it on again every 3-4 hours or as told by your doctor. See your doctor if the bandage seems to make your problems worse.  Elevation Raise the injured area above the level of your heart while you are sitting or lying down. Follow these instructions at home: If your symptoms get worse or last a long time, make a follow-up appointment with your doctor. You may need to have imaging tests, such as X-rays or an MRI. If you have imaging tests, ask how to get your results when they are ready. Return to your normal activities when your doctor says that it is safe. Keep all follow-up visits. Contact a doctor if: You keep having pain and swelling. Your symptoms get worse. Get help right away if: You have sudden, very bad pain at your injury or lower than your injury. You have redness or more swelling around your injury. You have tingling or numbness at your injury or lower than your injury, and it does not go away when you take off the bandage. Summary Many injuries can be cared for using rest, ice, compression, and elevation (RICE therapy). You can go back to your normal activities when your doctor says it  is okay and when you can do them without pain. Put ice on the injured area as told by your doctor. Get help if your symptoms get worse or if you keep having pain and swelling. This information is not intended to replace advice given to you by your health care provider. Make sure you discuss any questions you have with your health care provider. Document Revised: 09/10/2020 Document Reviewed: 09/10/2020 Elsevier Patient Education  [place]: Need help with paperwork       Care Coordination Interventions: Closing goal-goal met. Also see new plan of care for CCM Patient called to talk with [facility] about paperwork she needed signed from the pcp. Education and support given Advised patient to wait for Rachell to call the patient back and let her know if the paperwork she had sent to the office for the pcp to sign has come to the office fax.  Provided education to patient re: getting Dr. Parks Ranger to sign the paperwork and would get it faxed back to the patient at her apartment complex for expressed needs.  Collaborated with office staff regarding paperwork faxed to the [facility] attention that needed to be reviewed by Dr. Parks Ranger and signed. The paperwork has been received and Rachell will give to Dr. Parks Ranger to signing and return fax to the apartment complex Discussed plans with patient for ongoing care management follow up and provided patient with direct contact information for care management team Advised patient to discuss new concerns and changes with provider           Patient verbalizes understanding of instructions and care plan provided today and agrees to view in [place]. Active MyChart status and patient understanding of how to access instructions and care plan via MyChart confirmed with patient.     Telephone follow up appointment with care management team member scheduled for: 12-08-2022 at 900 am

## 2022-10-06 NOTE — Chronic Care Management (AMB) (Signed)
Chronic Care Management Provider Comprehensive Care Plan    10/06/2022 Name: Cheryl Payne MRN: [id number] DOB: 06/09/1966  Referral to Chronic Care Management (CCM) services was placed by Provider:  Dr. Parks Ranger on Date: 09-29-2022.  Chronic Condition 1: HTN Provider Assessment and Plan  Relevant Medications     amLODipine (NORVASC) 5 MG tablet    Other Relevant Orders    BASIC METABOLIC PANEL WITH GFR   Remain off Amlodipine 5mg  daily - for a while longer. Keep track of BP. Can space out to 3 times a week or so on BP checks. IF readings >140/90 then we will need to restart the Amlodipine. But for now can remain of   Expected Outcome/Goals Addressed This Visit (Provider CCM goals/Provider Assessment and plan   CCM (HYPERTENSION)  EXPECTED OUTCOME:  MONITOR,SELF- MANAGE AND REDUCE SYMPTOMS OF HYPERTENSION   Symptom Management Condition 1: Take all medications as prescribed Attend all scheduled provider appointments Call provider office for new concerns or questions  call the Suicide and Crisis Lifeline: 988 call the [place] National Suicide Prevention Lifeline: (803)852-2104 or TTY: (432)796-9914 TTY 312-319-1203) to talk to a trained counselor call 1-800-273-TALK (toll free, 24 hour hotline) if experiencing a Mental Health or [place]  check blood pressure 3 times per week learn about high blood pressure call doctor for signs and symptoms of high blood pressure keep all doctor appointments take medications for blood pressure exactly as prescribed report new symptoms to your doctor eat more whole grains, fruits and vegetables, lean meats and healthy fats  Chronic Condition 2: COPD Provider Assessment and Plan Stable without flare Known mild obstructive COPD identified on CT imaging / Spirometry S/p lung cancer Currently not on maintenance     Expected Outcome/Goals Addressed This Visit (Provider CCM goals/Provider Assessment and plan   CCM: Maintain,  Monitor and Self-Manage Symptoms of COPD   Symptom Management Condition 2: Attend all scheduled provider appointments Call provider office for new concerns or questions  call the Suicide and Crisis Lifeline: 988 call the [place] National Suicide Prevention Lifeline: 435 125 3636 or TTY: 570-275-5362 TTY (214)785-1087) to talk to a trained counselor call 1-800-273-TALK (toll free, 24 hour hotline) if experiencing a Mental Health or [place]  eliminate smoking in my home identify and avoid work-related triggers identify and remove indoor air pollutants limit outdoor activity during cold weather listen for public air quality announcements every day develop a rescue plan eliminate symptom triggers at home follow rescue plan if symptoms flare-up get at least 7 to 8 hours of sleep at night  Chronic Condition 3: Pre-DM Provider Assessment and Plan  Relevant Orders     POCT glycosylated hemoglobin (Hb A1C) (Completed)      Expected Outcome/Goals Addressed This Visit (Provider CCM goals/Provider Assessment and plan   CCM (DIABETES) EXPECTED OUTCOME: MONITOR, SELF-MANAGE AND REDUCE SYMPTOMS OF DIABETES   Symptom Management Condition 3: Attend all scheduled provider appointments Call provider office for new concerns or questions  call the Suicide and Crisis Lifeline: 988 call the [place] National Suicide Prevention Lifeline: 9894551579 or TTY: 337-767-4115 TTY 5412499223) to talk to a trained counselor call 1-800-273-TALK (toll free, 24 hour hotline) if experiencing a Mental Health or [place]  check feet daily for cuts, sores or redness manage portion size wash and dry feet carefully every day wear comfortable, cotton socks wear comfortable, well-fitting shoes   Problem List Patient Active Problem List   Diagnosis Date Noted   GI bleed 08/17/2022  Upper GI bleed 08/16/2022   Leukocytosis 08/16/2022   Chronic left-sided low back pain with  left-sided sciatica 09/21/2021   Pure hypercholesterolemia 07/13/2020   Major depressive disorder, recurrent, in partial remission ([place]) 07/13/2020   GAD (generalized anxiety disorder) 07/13/2020   Encounter for screening mammogram for malignant neoplasm of breast 07/13/2020   Muscle spasm of left shoulder 07/13/2020   Chronic left shoulder pain 07/13/2020   Centrilobular emphysema ([place]) 07/13/2020   Moderate aortic valve insufficiency 07/13/2020   Morbid obesity ([place]) 07/13/2020   Non-small cell carcinoma of lung ([place]) 01/02/2019   Essential hypertension 09/02/2014    Medication Management  Current Outpatient Medications:    escitalopram (LEXAPRO) 10 MG tablet, Take 1 tablet (10 mg total) by mouth at bedtime. Home med., Disp: , Rfl:    gabapentin (NEURONTIN) 300 MG capsule, Take 2 capsules (600 mg total) by mouth at bedtime. Take 2 capsules (600mg ) by mouth at bedtime - may take 1 additional capsule (300mg ) by mouth daily as needed for pain.  Home med., Disp: , Rfl:    omeprazole (PRILOSEC) 40 MG capsule, Take 1 capsule (40 mg total) by mouth daily., Disp: 30 capsule, Rfl: 2   amLODipine (NORVASC) 5 MG tablet, Take 1 tablet (5 mg total) by mouth daily. (Note this medication has been temporarily HELD, if BP increases in future, please fill and start taking) (Patient not taking: Reported on 10/06/2022), Disp: 90 tablet, Rfl: 3   rosuvastatin (CRESTOR) 10 MG tablet, TAKE 1 TABLET(10 MG) BY MOUTH DAILY (Patient not taking: Reported on 10/06/2022), Disp: 90 tablet, Rfl: 0   tiZANidine (ZANAFLEX) 4 MG tablet, Take 4 mg by mouth at bedtime., Disp: , Rfl:   Cognitive Assessment Identity Confirmed: : Name; DOB Cognitive Status: Normal   Functional Assessment Hearing Difficulty or Deaf: no Wear Glasses or Blind: no Concentrating, Remembering or Making Decisions Difficulty (CP): no Difficulty Communicating: no Difficulty Eating/Swallowing: no Walking or Climbing Stairs Difficulty:  no Dressing/Bathing Difficulty: no Doing Errands Independently Difficulty (such as shopping) (CP): no Change in Functional Status Since Onset of Current Illness/Injury: no   Caregiver Assessment  Primary Source of Support/Comfort: significant other Name of Support/Comfort Primary Source: boyfriend takes People in Home: alone Family Caregiver if Needed: significant other Family Caregiver Names: Nyoka Cowden- boyfriend   Planned Interventions  Provided education to patient about basic DM disease process; Reviewed prescribed diet with patient heart healthy/ADA diet ; Counseled on importance of regular laboratory monitoring as prescribed;        Discussed plans with patient for ongoing care management follow up and provided patient with direct contact information for care management team;      Provided patient with written educational materials related to hypo and hyperglycemia and importance of correct treatment;       Reviewed scheduled/upcoming provider appointments including: 12-02-2022 at 8 am;         call provider for findings outside established parameters;       Review of patient status, including review of consultants reports, relevant laboratory and other test results, and medications completed;       Advised patient to discuss changes that would warrant taking medications for DM, questions or concerns related to effective management of DM with provider;      The patient was in the hospital for a GI bleed in September. Review of the letter the patient received from Dr. Vicente Males concerning her EGD and biopsy report. The patient has a call into the GI doctor to see  about repeat EGD since it is coming up on the 8 week mark post EGD and needs a follow up with GI doctor and per instructions from the discharge summary the patient is to have a repeat EGD due to bleeding  ulcers and medications management plan of care. Will continue to monitor.  Evaluation of current treatment plan related to  hypertension self management and patient's adherence to plan as established by provider;   Provided education to patient re: stroke prevention, s/s of heart attack and stroke; Reviewed prescribed diet heart healthy diet, avoiding spicy foods due to recent GI Bleed Reviewed medications with patient and discussed importance of compliance;  Discussed plans with patient for ongoing care management follow up and provided patient with direct contact information for care management team; Advised patient, providing education and rationale, to monitor blood pressure daily and record, calling PCP for findings outside established parameters;  Reviewed scheduled/upcoming provider appointments including:  Advised patient to discuss changes in blood pressures that warrant changes in medications or plan of care with provider; Provided education on prescribed diet heart healthy diet, avoiding spicy foods;  Discussed complications of poorly controlled blood pressure such as heart disease, stroke, circulatory complications, vision complications, kidney impairment, sexual dysfunction;  Screening for signs and symptoms of depression related to chronic disease state;  Assessed social determinant of health barriers;  Provided patient with basic written and verbal COPD education on self care/management/and exacerbation prevention Advised patient to track and manage COPD triggers Provided written and verbal instructions on pursed lip breathing and utilized returned demonstration as teach back Advised patient to self assesses COPD action plan zone and make appointment with provider if in the yellow zone for 48 hours without improvement Advised patient to engage in light exercise as tolerated 3-5 days a week to aid in the the management of COPD Provided education about and advised patient to utilize infection prevention strategies to reduce risk of respiratory infection Discussed the importance of adequate rest and  management of fatigue with COPD Screening for signs and symptoms of depression related to chronic disease state  Assessed social determinant of health barriers Interaction and coordination with outside resources, practitioners, and providers See CCM Referral  Care Plan: Available in MyChart

## 2022-10-06 NOTE — Telephone Encounter (Signed)
Patient called and left a viucemauk to call her back. I then called back.

## 2022-10-06 NOTE — Telephone Encounter (Signed)
Called patient again and left her another voicemail to call us back.

## 2022-10-07 ENCOUNTER — Other Ambulatory Visit: Payer: Self-pay | Admitting: Family Medicine

## 2022-10-07 DIAGNOSIS — E7800 Pure hypercholesterolemia, unspecified: Secondary | ICD-10-CM

## 2022-10-10 NOTE — Telephone Encounter (Signed)
Requested medications are due for refill today.  yes  Requested medications are on the active medications list.  yes  Last refill. 07/04/2022 #90 0 rf  Future visit scheduled.   yes  Notes to clinic.  Labs are expired.    Requested Prescriptions  Pending Prescriptions Disp Refills   rosuvastatin (CRESTOR) 10 MG tablet [Pharmacy Med Name: ROSUVASTATIN 10MG  TABLETS] 90 tablet 0    Sig: TAKE 1 TABLET(10 MG) BY MOUTH DAILY     Cardiovascular:  Antilipid - Statins 2 Failed - 10/07/2022  5:36 PM      Failed - Lipid Panel in normal range within the last 12 months    Cholesterol  Date Value Ref Range Status  08/17/2021 203 (H) <200 mg/dL Final   LDL Cholesterol (Calc)  Date Value Ref Range Status  08/17/2021 120 (H) mg/dL (calc) Final    Comment:    Reference range: <100 . Desirable range <100 mg/dL for primary prevention;   <70 mg/dL for patients with CHD or diabetic patients  with > or = 2 CHD risk factors. Marland Kitchen LDL-C is now calculated using the Martin-Hopkins  calculation, which is a validated novel method providing  better accuracy than the Friedewald equation in the  estimation of LDL-C.  Cresenciano Genre et al. Annamaria Helling. 7517;001(74): 2061-2068  (http://education.[facility].com/faq/FAQ164)    HDL  Date Value Ref Range Status  08/17/2021 56 > OR = 50 mg/dL Final   Triglycerides  Date Value Ref Range Status  08/17/2021 157 (H) <150 mg/dL Final         Passed - Cr in normal range and within 360 days    Creat  Date Value Ref Range Status  08/30/2022 0.81 0.50 - 1.03 mg/dL Final         Passed - Patient is not pregnant      Passed - Valid encounter within last 12 months    Recent Outpatient Visits           1 month ago Upper GI bleed   [place], DO   7 months ago Chronic left shoulder pain   [place], DO   9 months ago Vaginal discharge   [place], DO   1 year ago Chronic left-sided low back pain with left-sided sciatica   [place], DO   1 year ago Annual physical exam   [place], DO       Future Appointments             In 1 month Parks Ranger, Devonne Doughty, [facility], [facility]

## 2022-10-12 NOTE — Telephone Encounter (Signed)
Called patient back and she did not answer my call.

## 2022-10-16 IMAGING — MG MM DIGITAL SCREENING BILAT W/ TOMO AND CAD
8 series · 8 of 24 positions shown · non-contrast
Comparison: Previous exam(s).

ACR Breast Density Category a: The breast tissue is almost entirely
fatty.

CLINICAL DATA: Screening.

EXAM:
DIGITAL SCREENING BILATERAL MAMMOGRAM WITH TOMOSYNTHESIS AND CAD
TECHNIQUE: Bilateral screening digital craniocaudal and mediolateral oblique
mammograms were obtained. Bilateral screening digital breast
tomosynthesis was performed. The images were evaluated with
computer-aided detection.

[L CC synth-2D]
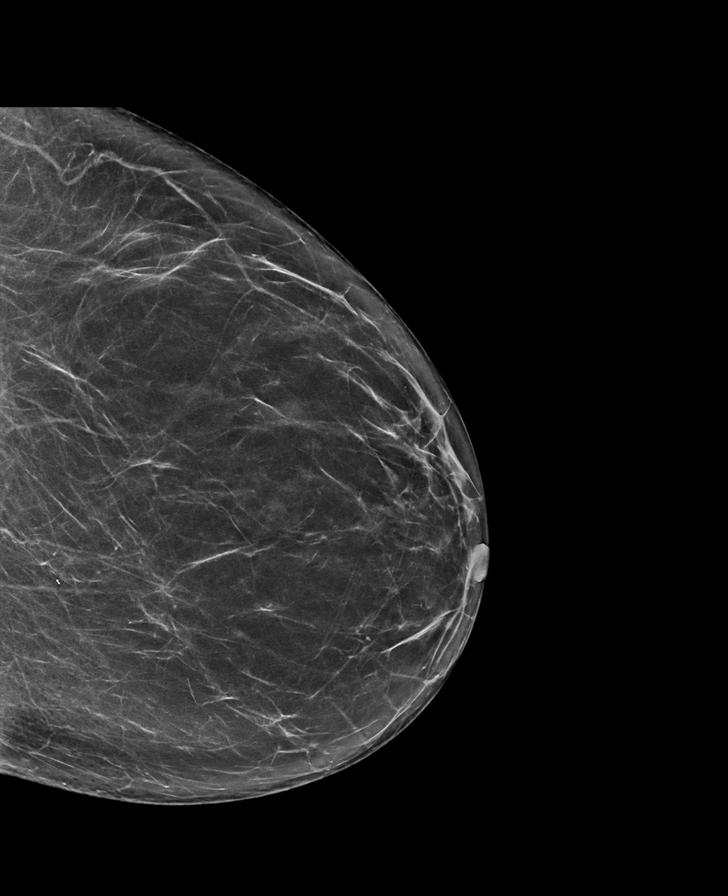

[L MLO synth-2D]
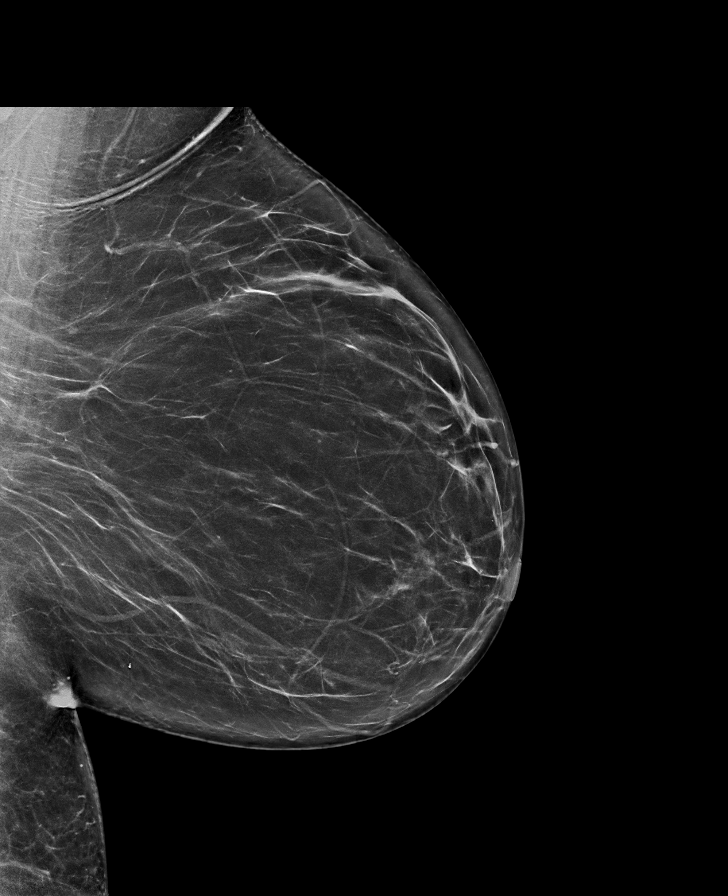

[R MLO synth-2D]
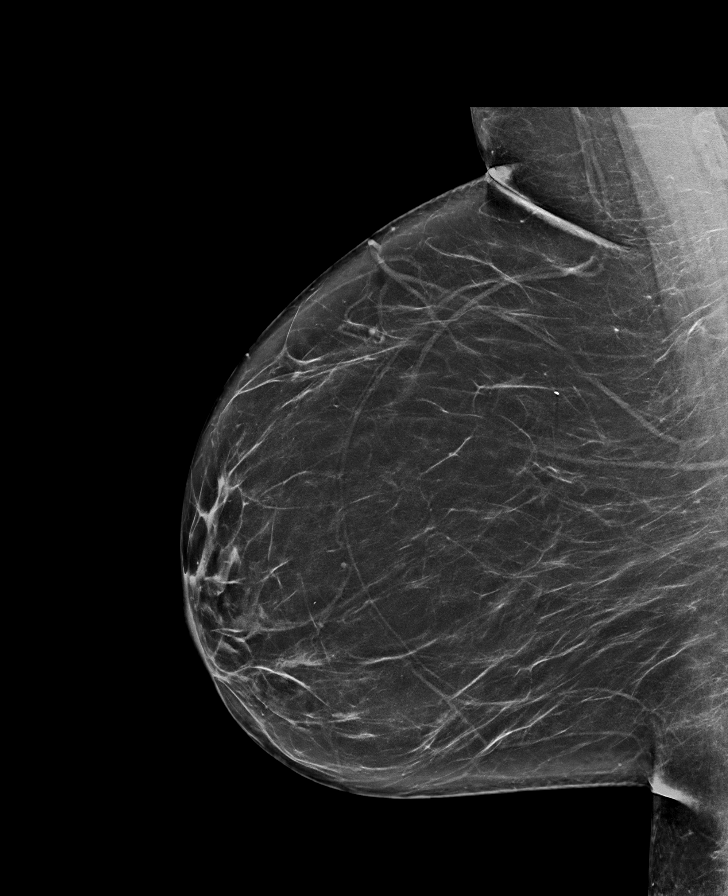

[R CC synth-2D]
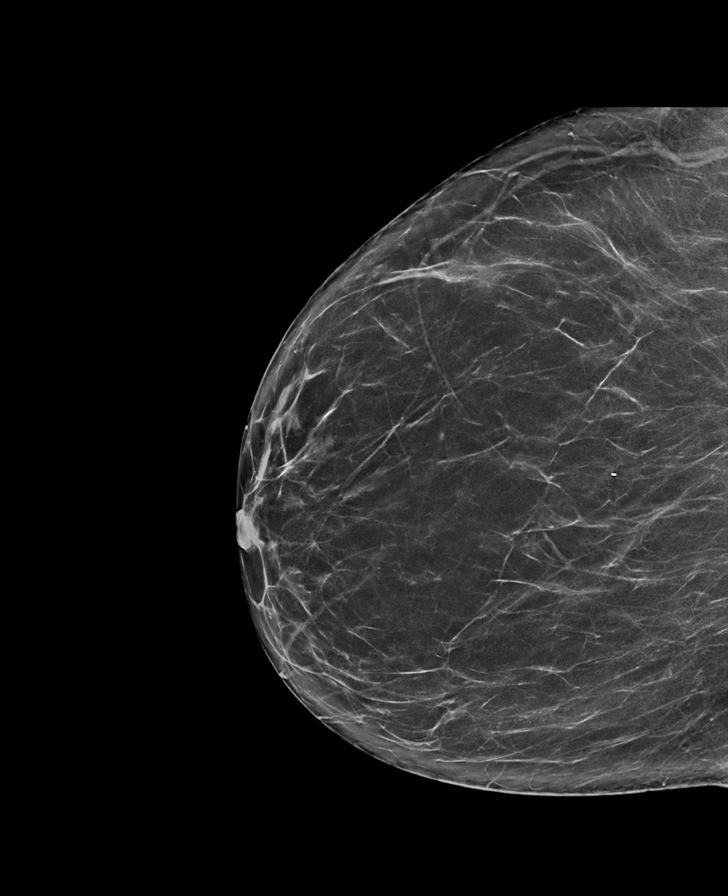

[R CC tomo · tomo slice 37/73.0]
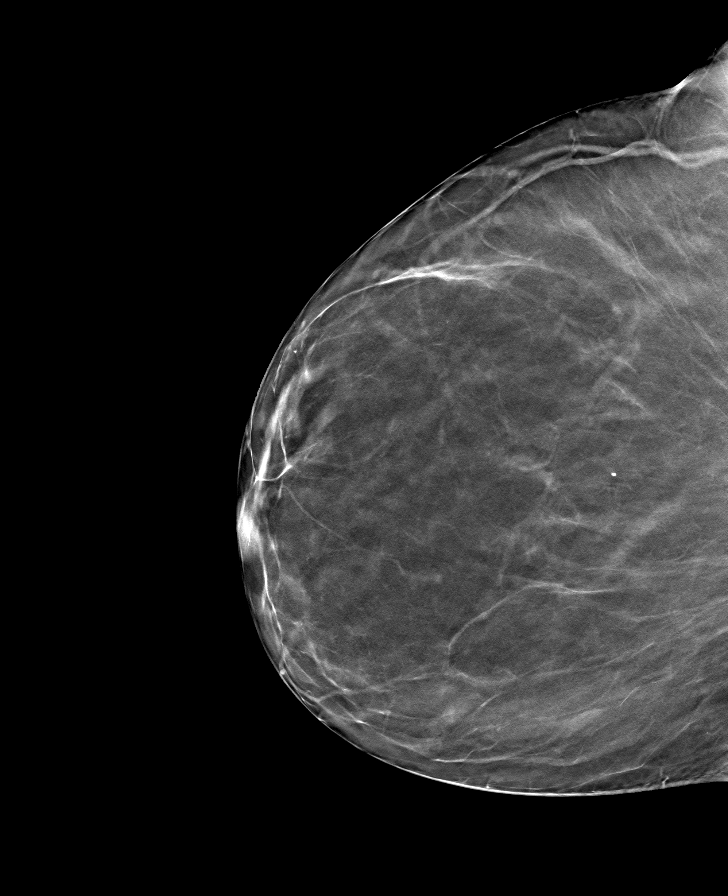

[R MLO tomo · tomo slice 42/83.0]
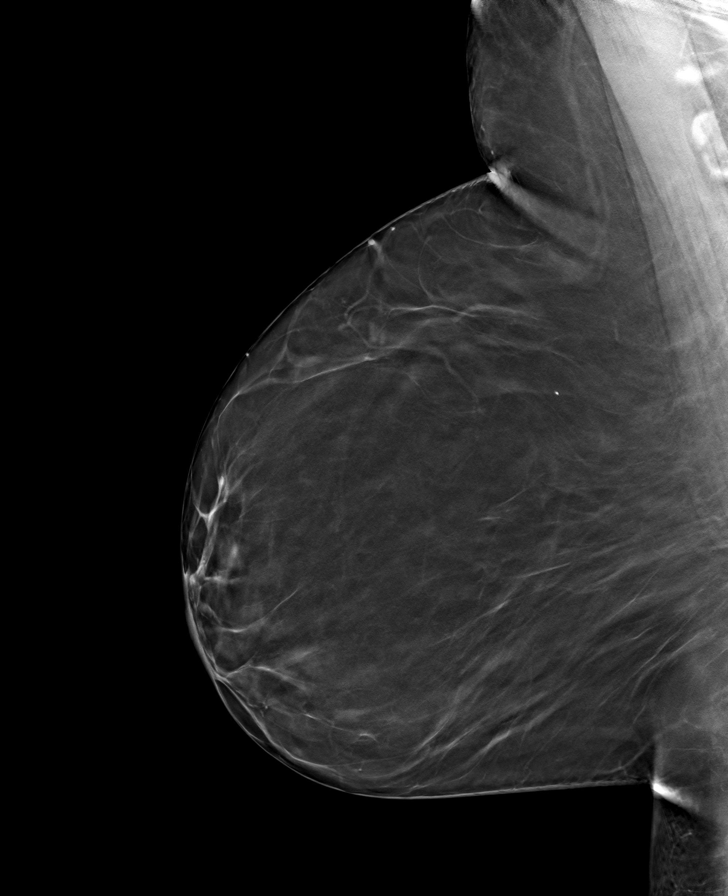

[L CC tomo · tomo slice 37/73.0]
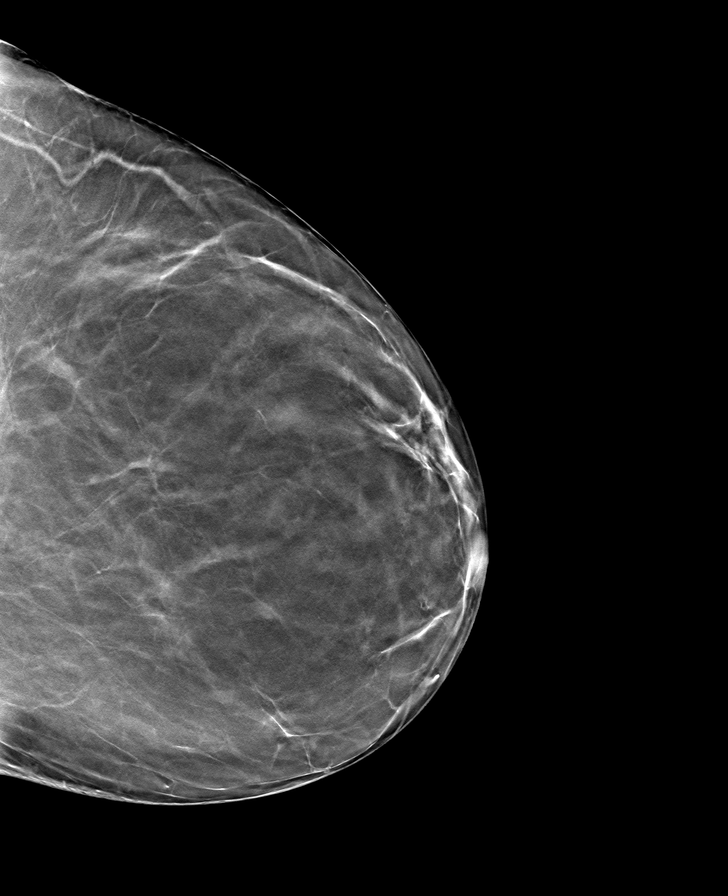

[L MLO tomo · tomo slice 41/80.0]
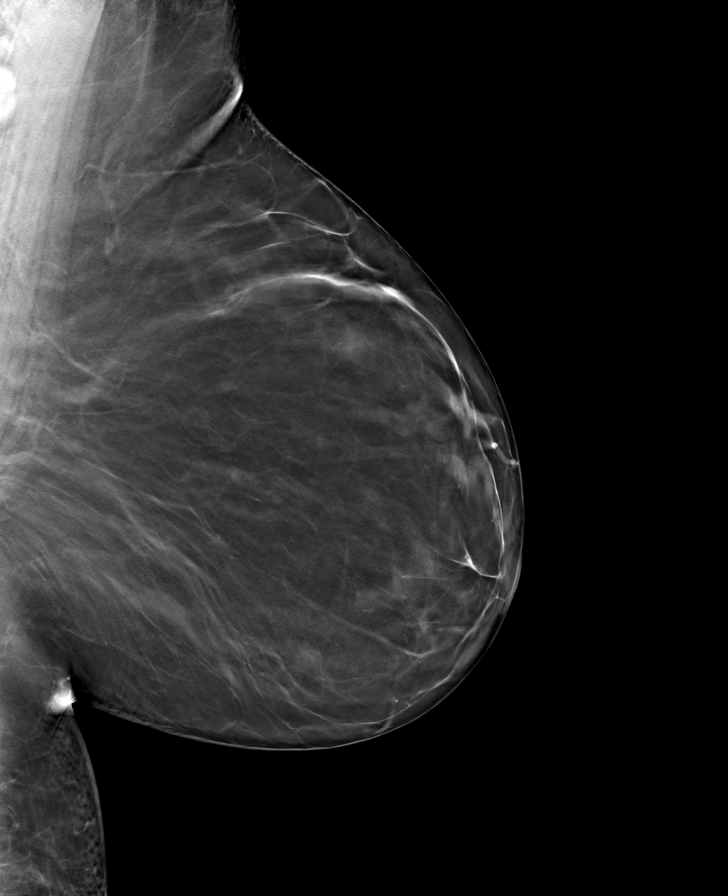

[8 of 24 positions shown; findings below may reference images not displayed]

FINDINGS: There are no findings suspicious for malignancy.
IMPRESSION: No mammographic evidence of malignancy. A result letter of this
screening mammogram will be mailed directly to the patient.

RECOMMENDATION:
Screening mammogram in one year. (Code:0E-[id number])

BI-RADS CATEGORY  1: Negative.

## 2022-11-03 DIAGNOSIS — I10 Essential (primary) hypertension: Secondary | ICD-10-CM

## 2022-11-03 DIAGNOSIS — J449 Chronic obstructive pulmonary disease, unspecified: Secondary | ICD-10-CM

## 2022-11-14 ENCOUNTER — Other Ambulatory Visit: Payer: Self-pay | Admitting: Family Medicine

## 2022-11-14 DIAGNOSIS — G8929 Other chronic pain: Secondary | ICD-10-CM

## 2022-11-15 NOTE — Telephone Encounter (Signed)
Requested medication (s) are due for refill today: routing for review  Requested medication (s) are on the active medication list: yes  Last refill:  08/18/22  Future visit scheduled:yes  Notes to clinic:  Unable to refill per protocol, last refill by another provider.      Requested Prescriptions  Pending Prescriptions Disp Refills   gabapentin (NEURONTIN) 300 MG capsule [Pharmacy Med Name: GABAPENTIN 300MG  CAPSULES] 90 capsule     Sig: TAKE 1 CAPSULE BY MOUTH AT BEDTIME. MAY INCREASE TO 2 CAPSULES AT BEDTIME AS TOLERATED. ALSO. MAY TAKE 1 CAPSULE DURNING DAY AS NEEDED     Neurology: Anticonvulsants - gabapentin Passed - 11/14/2022  9:29 AM      Passed - Cr in normal range and within 360 days    Creat  Date Value Ref Range Status  08/30/2022 0.81 0.50 - 1.03 mg/dL Final         Passed - Completed PHQ-2 or PHQ-9 in the last 360 days      Passed - Valid encounter within last 12 months    Recent Outpatient Visits           2 months ago Upper GI bleed   [place], DO   8 months ago Chronic left shoulder pain   [place], DO   10 months ago Vaginal discharge   [place], DO   1 year ago Chronic left-sided low back pain with left-sided sciatica   [place], DO   1 year ago Annual physical exam   [place], DO       Future Appointments             In 2 weeks Parks Ranger, Devonne Doughty, [facility], [facility]

## 2022-11-25 ENCOUNTER — Ambulatory Visit: Payer: Self-pay | Admitting: *Deleted

## 2022-11-25 NOTE — Telephone Encounter (Signed)
Summary: runny nose and runny eyes: what to take??   Pt has a runny nose, eye are running for 3 days. Pt states this is constant.  Pt wants to know what she can get OTC to take.       Chief Complaint: constant runny nose and watery eyes Symptoms: runny nose watery eyes , diarrhea loose BMs since Wednesday in mornings.  Frequency: 3 days  Pertinent Negatives: Patient denies chest pain no difficulty breathing, no fever, no sore throat. Disposition: [] ED /[] Urgent Care (no appt availability in office) / [] Appointment(In office/virtual)/ []  [place] [facility]/ [x] Home Care/ [] Refused Recommended Disposition /[] [place] [facility]/ []  Follow-up with PCP Additional Notes:   Recommended increase water/ fluid intake. Recommended OTC antihistamine . Patient also has loratadine.       Reason for Disposition  Common cold with no complications  Answer Assessment - Initial Assessment Questions 1. ONSET: "When did the nasal discharge start?"      3 days ago  2. AMOUNT: "How much discharge is there?"      Constant runny nose and watery eyes  3. COUGH: "Do you have a cough?" If Yes, ask: "Describe the color of your sputum" (clear, white, yellow, green)     na 4. RESPIRATORY DISTRESS: "Describe your breathing."      na 5. FEVER: "Do you have a fever?" If Yes, ask: "What is your temperature, how was it measured, and when did it start?"     na 6. SEVERITY: "Overall, how bad are you feeling right now?" (e.g., doesn't interfere with normal activities, staying home from school/work, staying in bed)      Feeling ok but has constant eyes watering and runny nose sneezing better than 3 days ago  7. OTHER SYMPTOMS: "Do you have any other symptoms?" (e.g., sore throat, earache, wheezing, vomiting)     Eyes runny and constant nose runny  8. PREGNANCY: "Is there any chance you are pregnant?" "When was your last menstrual period?"     na  Protocols used: Common Cold-A-AH

## 2022-12-02 ENCOUNTER — Encounter: Payer: Self-pay | Admitting: Family Medicine

## 2022-12-02 ENCOUNTER — Ambulatory Visit (INDEPENDENT_AMBULATORY_CARE_PROVIDER_SITE_OTHER): Payer: Medicare Other | Admitting: Family Medicine

## 2022-12-02 VITALS — BP 119/64 | HR 70 | Ht 62.0 in | Wt 194.0 lb

## 2022-12-02 DIAGNOSIS — Z23 Encounter for immunization: Secondary | ICD-10-CM | POA: Diagnosis not present

## 2022-12-02 DIAGNOSIS — J432 Centrilobular emphysema: Secondary | ICD-10-CM | POA: Diagnosis not present

## 2022-12-02 DIAGNOSIS — I10 Essential (primary) hypertension: Secondary | ICD-10-CM

## 2022-12-02 DIAGNOSIS — F411 Generalized anxiety disorder: Secondary | ICD-10-CM

## 2022-12-02 DIAGNOSIS — K279 Peptic ulcer, site unspecified, unspecified as acute or chronic, without hemorrhage or perforation: Secondary | ICD-10-CM | POA: Diagnosis not present

## 2022-12-02 DIAGNOSIS — G8929 Other chronic pain: Secondary | ICD-10-CM

## 2022-12-02 DIAGNOSIS — R195 Other fecal abnormalities: Secondary | ICD-10-CM | POA: Diagnosis not present

## 2022-12-02 DIAGNOSIS — F3341 Major depressive disorder, recurrent, in partial remission: Secondary | ICD-10-CM

## 2022-12-02 DIAGNOSIS — M5442 Lumbago with sciatica, left side: Secondary | ICD-10-CM | POA: Diagnosis not present

## 2022-12-02 MED ORDER — GABAPENTIN 300 MG PO CAPS: 600.0000 mg | ORAL_CAPSULE | Freq: Every day | ORAL | 3 refills | Status: DC

## 2022-12-02 MED ORDER — ESCITALOPRAM OXALATE 20 MG PO TABS: 20.0000 mg | ORAL_TABLET | Freq: Every day | ORAL | 3 refills | Status: DC

## 2022-12-02 NOTE — Patient Instructions (Addendum)
Thank you for coming to the office today.  [facility] OTC medication, Peppermint Oil (Triple Coated Capsule) 180mg  take one 3 times daily to reduce diarrhea  Yogurt is good as a probiotic. - Activa  Dose increase Escitalopram (Lexapro) from 10 to 20mg  daily  Next apt can consider adding Buspar (other anxiety med) to your current regimen. It can be taken 1 to 3 times a day and is more flexible can skip doses if need.  Refilled Amlodipine and Gabapentin.  Please schedule a Follow-up Appointment to: Return in about 10 weeks (around 02/10/2023) for 10 weeks follow-up 2nd Shingrix / Anxiety / HTN / GERD-GI updates.  If you have any other questions or concerns, please feel free to call the office or send a message through [place]. You may also schedule an earlier appointment if necessary.  Additionally, you may be receiving a survey about your experience at our office within a few days to 1 week by e-mail or mail. We value your feedback.  Nobie Putnam, DO [place]

## 2022-12-02 NOTE — Progress Notes (Signed)
Subjective:    Patient ID: Cheryl Payne, female    DOB: 1966/08/28, 56 y.o.   MRN: [id number]  Cheryl Payne is a 56 y.o. female presenting on 12/02/2022 for Hypertension, Anxiety, and Anemia   HPI  GERD / PUD Upcoming apt with  GI Dr Vicente Males 02/07/23 Completed Omeprazole 73m, 3 months Now off medication and doing well.  Recent Viral Gastro. Loose stool diarrhea, nasal congestion She took OTC Claritin Symptoms started last week Wednesday. Now her URI symptoms have resolved.  Generalized Anxiety Disorder Major Depression, chronic recurrent mild - partial remission Chronic history 10+ years in past, has had primarily issues with generalized anxiety disorder Previously on Escitalopram 136mdaily asking for more assistance with meds.  Hypertension Last result lipid 08/2021 - Currently taking FULL DOSE now Rosuvastatin 1078mtolerating well without side effects or myalgias - Taking Amlodipine 5mg46mily (has not taken today, until after blood work) - Taking Metoprolol tartrate 25mg41m for cardioprotection  Health Maintenance: Last Mammogram 12/30/21  Last Colon CA Screening Cologuard 08/06/20     12/02/2022    8:08 AM 07/15/2022    9:07 AM 03/31/2022   10:06 AM  Depression screen PHQ 2/9  Decreased Interest 1 0 0  Down, Depressed, Hopeless _0 PHQ - 2 Score _1 Altered sleeping 2 0   Tired, decreased energy 1 1   Change in appetite 1 0   Feeling bad or failure about yourself  0 0   Trouble concentrating 0 0   Moving slowly or fidgety/restless 0 0   Suicidal thoughts 0 0   PHQ-9 Score 6 2   Difficult doing work/chores Not difficult at all Not difficult at all     Social History   Tobacco Use   Smoking status: Former    Packs/day: 1.00    Years: 35.00    Total pack years: 35.00    Types: Cigarettes   Smokeless tobacco: Former    Quit date: 06/04/2018  Vaping Use   Vaping Use: Never used  Substance Use Topics   Alcohol use: Never   Drug use: Never     Review of Systems Per HPI unless specifically indicated above     Objective:    BP 119/64   Pulse 70   Ht _2  (1.575 m)   Wt 194 lb (88 kg)   SpO2 100%   BMI 35.48 kg/m   Wt Readings from Last 3 Encounters:  12/02/22 194 lb (88 kg)  08/30/22 204 lb 12.8 oz (92.9 kg)  08/16/22 210 lb (95.3 kg)    Physical Exam Vitals and nursing note reviewed.  Constitutional:      General: She is not in acute distress.    Appearance: She is well-developed. She is not diaphoretic.     Comments: Well-appearing, comfortable, cooperative  HENT:     Head: Normocephalic and atraumatic.  Eyes:     General:        Right eye: No discharge.        Left eye: No discharge.     Conjunctiva/sclera: Conjunctivae normal.  Neck:     Thyroid: No thyromegaly.  Cardiovascular:     Rate and Rhythm: Normal rate and regular rhythm.     Heart sounds: Normal heart sounds. No murmur heard. Pulmonary:     Effort: Pulmonary effort is normal. No respiratory distress.     Breath sounds: Normal breath sounds. No wheezing or rales.  Musculoskeletal:  General: Normal range of motion.     Cervical back: Normal range of motion and neck supple.  Lymphadenopathy:     Cervical: No cervical adenopathy.  Skin:    General: Skin is warm and dry.     Findings: No erythema or rash.  Neurological:     Mental Status: She is alert and oriented to person, place, and time.  Psychiatric:        Behavior: Behavior normal.     Comments: Well groomed, good eye contact, normal speech and thoughts       Results for orders placed or performed in visit on 08/30/22  CBC with Differential/Platelet  Result Value Ref Range   WBC 7.8 3.8 - 10.8 Thousand/uL   RBC 3.38 (L) 3.80 - 5.10 Million/uL   Hemoglobin 9.9 (L) 11.7 - 15.5 g/dL   HCT 31.6 (L) 35.0 - 45.0 %   MCV 93.5 80.0 - 100.0 fL   MCH 29.3 27.0 - 33.0 pg   MCHC 31.3 (L) 32.0 - 36.0 g/dL   RDW 16.7 (H) 11.0 - 15.0 %   Platelets 372 140 - 400 Thousand/uL    MPV 10.5 7.5 - 12.5 fL   Neutro Abs 4,337 1,500 - 7,800 cells/uL   Lymphs Abs 2,753 850 - 3,900 cells/uL   Absolute Monocytes 632 200 - 950 cells/uL   Eosinophils Absolute 39 15 - 500 cells/uL   Basophils Absolute 39 0 - 200 cells/uL   Neutrophils Relative % 55.6 %   Total Lymphocyte 35.3 %   Monocytes Relative 8.1 %   Eosinophils Relative 0.5 %   Basophils Relative 0.5 %  BASIC METABOLIC PANEL WITH GFR  Result Value Ref Range   Glucose, Bld 90 65 - 139 mg/dL   BUN 7 7 - 25 mg/dL   Creat 0.81 0.50 - 1.03 mg/dL   eGFR 85 > OR = 60 mL/min/1.22m   BUN/Creatinine Ratio SEE NOTE: 6 - 22 (calc)   Sodium 140 135 - 146 mmol/L   Potassium 4.3 3.5 - 5.3 mmol/L   Chloride 103 98 - 110 mmol/L   CO2 28 20 - 32 mmol/L   Calcium 9.3 8.6 - 10.4 mg/dL      Assessment & Plan:   Problem List Items Addressed This Visit     Centrilobular emphysema (HCC)   Chronic left-sided low back pain with left-sided sciatica   Relevant Medications   gabapentin (NEURONTIN) 300 MG capsule   escitalopram (LEXAPRO) 20 MG tablet   Essential hypertension - Primary   GAD (generalized anxiety disorder)   Relevant Medications   escitalopram (LEXAPRO) 20 MG tablet   Major depressive disorder, recurrent, in partial remission (HCC)   Relevant Medications   escitalopram (LEXAPRO) 20 MG tablet   Other Visit Diagnoses     Need for shingles vaccine       Relevant Orders   Varicella-zoster vaccine IM   PUD (peptic ulcer disease)       Loose stools           GERD/PUD History of GI Bleed She has not had apt yet with GI Scheduled for 02/07/23 with  GI She is doing well with this, no further issues  Recommend for loose stool diarrhea IBGard OTC medication, Peppermint Oil (Triple Coated Capsule) 1826mtake one 3 times daily to reduce diarrhea Yogurt is good as a probiotic. - Activa  Likely viral gastro recently now improving.  Okay to remain off PPI  Anxiety Chronic problem Recent worsening  stressors. Dose  increase Escitalopram (Lexapro) from 10 to 52m daily  Next apt can consider adding Buspar (other anxiety med) to your current regimen. It can be taken 1 to 3 times a day and is more flexible can skip doses if need.  Refilled Amlodipine and Gabapentin.  Meds ordered this encounter  Medications   gabapentin (NEURONTIN) 300 MG capsule    Sig: Take 2 capsules (600 mg total) by mouth at bedtime.    Dispense:  180 capsule    Refill:  3    Add future refills, adjust sig   escitalopram (LEXAPRO) 20 MG tablet    Sig: Take 1 tablet (20 mg total) by mouth at bedtime.    Dispense:  90 tablet    Refill:  3    Dose increase     Follow up plan: Return in about 10 weeks (around 02/10/2023) for 10 weeks follow-up 2nd Shingrix / Anxiety / HTN / GERD-GI updates.   ANobie Putnam DO [facility] 12/02/2022, 8:11 AM

## 2022-12-08 ENCOUNTER — Ambulatory Visit (INDEPENDENT_AMBULATORY_CARE_PROVIDER_SITE_OTHER): Payer: Medicaid Other

## 2022-12-08 ENCOUNTER — Telehealth: Payer: Medicare Other

## 2022-12-08 ENCOUNTER — Telehealth: Payer: Self-pay

## 2022-12-08 DIAGNOSIS — I10 Essential (primary) hypertension: Secondary | ICD-10-CM

## 2022-12-08 DIAGNOSIS — R7303 Prediabetes: Secondary | ICD-10-CM

## 2022-12-08 DIAGNOSIS — J432 Centrilobular emphysema: Secondary | ICD-10-CM

## 2022-12-08 NOTE — Telephone Encounter (Signed)
   CCM RN Visit Note   12-08-2022 Name: Cheryl Payne MRN: [id number]      DOB: [birth year]  Subjective: Cheryl Payne is a 57 y.o. year old female who is a primary care patient of Dr. Parks Ranger. The patient was referred to the Chronic Care Management team for assistance with care management needs subsequent to provider initiation of CCM services and plan of care.      An unsuccessful telephone outreach was attempted today to contact the patient about Chronic Care Management needs.    Plan:A HIPAA compliant phone message was left for the patient providing contact information and requesting a return call.  Noreene Larsson RN, MSN, CCM RN Care Manager  Chronic Care Management Direct Number: [phone]

## 2022-12-08 NOTE — Chronic Care Management (AMB) (Signed)
Chronic Care Management   CCM RN Visit Note  12/08/2022 Name: Cheryl Payne MRN: [id number] DOB: [birth year]  Subjective: Cheryl Payne is a 56 y.o. year old female who is a primary care patient of [name], DO. The patient was referred to the Chronic Care Management team for assistance with care management needs subsequent to provider initiation of CCM services and plan of care.    Today's Visit:  Engaged with patient by telephone for follow up visit.     SDOH Interventions Today    Flowsheet Row Most Recent Value  SDOH Interventions   Food Insecurity Interventions Intervention Not Indicated  Housing Interventions Intervention Not Indicated  Transportation Interventions Intervention Not Indicated  Utilities Interventions Intervention Not Indicated  Alcohol Usage Interventions Intervention Not Indicated (Score <7)  Financial Strain Interventions Intervention Not Indicated  Physical Activity Interventions Intervention Not Indicated  Stress Interventions Intervention Not Indicated  Social Connections Interventions Intervention Not Indicated, Other (Comment)  [good support system]         Goals Addressed             This Visit's Progress    CCM Expected Outcome:  Monitor, Self-Manage and Reduce Symptoms of: Pre-Diabetes       Current Barriers:  Knowledge Deficits related to dietary changes to help with managing pre-DM Chronic Disease Management support and education needs related to effective management of pre-DM with hospitalization for GI bleed in September  Lab Results  Component Value Date   HGBA1C 5.7 (A) 12/24/2021     Planned Interventions: Provided education to patient about basic DM disease process; Reviewed prescribed diet with patient heart healthy/ADA diet. Review of dietary restrictions; Counseled on importance of regular laboratory monitoring as prescribed;        Discussed plans with patient for ongoing care management follow up and  provided patient with direct contact information for care management team;      Provided patient with written educational materials related to hypo and hyperglycemia and importance of correct treatment;       Reviewed scheduled/upcoming provider appointments including: 02-10-2023        call provider for findings outside established parameters;       Review of patient status, including review of consultants reports, relevant laboratory and other test results, and medications completed;       Advised patient to discuss changes that would warrant taking medications for DM, questions or concerns related to effective management of DM with provider;      The patient was in the hospital for a GI bleed in September. Review of the letter the patient received from Dr. Vicente Males concerning her EGD and biopsy report. The patient has a call into the GI doctor to see about repeat EGD since it is coming up on the 8 week mark post EGD and needs a follow up with GI doctor and per instructions from the discharge summary the patient is to have a repeat EGD due to bleeding  ulcers and medications management plan of care. Will continue to monitor. The patient states her GI symptoms are better. She is still going to see the specialist in March. The patient states that she has been more anxious than usual and the pcp is working with her on this. She got some peppermint capsules to use and this is helping. The pcp recommended this for her for her GI issues. Assisted the patient in finding a cheaper option of Peppermint capsules on [place]. She is going to check into  this and have some on hand if she needs them. She will also let the office know if she needs additional anxiety medications.   Symptom Management: Attend all scheduled provider appointments Call provider office for new concerns or questions  call the Suicide and Crisis Lifeline: 988 call the [place] National Suicide Prevention Lifeline: 330-031-4449 or TTY: 339 114 6997 TTY  775-469-4790) to talk to a trained counselor call 1-800-273-TALK (toll free, 24 hour hotline) if experiencing a Mental Health or [place]  check feet daily for cuts, sores or redness trim toenails straight across manage portion size wash and dry feet carefully every day wear comfortable, cotton socks wear comfortable, well-fitting shoes  Follow Up Plan: Telephone follow up appointment with care management team member scheduled for: 02-16-2023 at 9 am       CCM Expected Outcome:  Monitor, Self-Manage, and Reduce Symptoms of Hypertension       Current Barriers:  Knowledge Deficits related to controlling blood pressures and monitoring for changes in blood pressure Care Coordination needs related to knowing how to effectively manage blood pressures and calling the office for changes in blood pressures and when to take medications and when to hold in a patient with HTN Chronic Disease Management support and education needs related to effective management of HTN  BP Readings from Last 3 Encounters:  12/02/22 119/64  08/30/22 128/60  08/18/22 115/65     Planned Interventions: Evaluation of current treatment plan related to hypertension self management and patient's adherence to plan as established by provider. The patient has stable blood pressures, no acute changes noted;   Provided education to patient re: stroke prevention, s/s of heart attack and stroke; Reviewed prescribed diet heart healthy diet, avoiding spicy foods due to recent GI Bleed. GI sx and sx have improved and the patient is using Peppermint capsules per directions of the pcp. Reviewed medications with patient and discussed importance of compliance. The patient is compliant with medications;  Discussed plans with patient for ongoing care management follow up and provided patient with direct contact information for care management team; Advised patient, providing education and rationale, to monitor blood  pressure daily and record, calling PCP for findings outside established parameters;  Reviewed scheduled/upcoming provider appointments including: 02-10-2023, saw pcp on 12-02-2022- had changes in medications Advised patient to discuss changes in blood pressures that warrant changes in medications or plan of care with provider; Provided education on prescribed diet heart healthy diet, avoiding spicy foods;  Discussed complications of poorly controlled blood pressure such as heart disease, stroke, circulatory complications, vision complications, kidney impairment, sexual dysfunction;  Screening for signs and symptoms of depression related to chronic disease state;  Assessed social determinant of health barriers;   Symptom Management: Take medications as prescribed   Attend all scheduled provider appointments Call pharmacy for medication refills 3-7 days in advance of running out of medications Call provider office for new concerns or questions  call the Suicide and Crisis Lifeline: 988 call the [place] National Suicide Prevention Lifeline: 985-157-1798 or TTY: 719 263 6954 TTY (380) 796-4233) to talk to a trained counselor call 1-800-273-TALK (toll free, 24 hour hotline) if experiencing a Mental Health or [place]   Follow Up Plan: Telephone follow up appointment with care management team member scheduled for: 02-16-2023 at 0900 am       CCM:  Maintain, Monitor and Self-Manage Symptoms of COPD       Current Barriers:  Knowledge Deficits related to triggers and factors that may cause COPD Exacerbation  Chronic Disease Management support and education needs related to COPD  Planned Interventions: Provided patient with basic written and verbal COPD education on self care/management/and exacerbation prevention. 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 She says she will work a little longer but she  thinks she is going to have to stop working because of the sickness she has been experiencing. Advised patient to track and manage COPD triggers. Review of changes in weather and monitoring for others around her being sick, education on staying safe and protecting self during peak sickness times.  Provided  verbal instructions on pursed lip breathing and utilized returned demonstration as teach back Advised patient to self assesses COPD action plan zone and make appointment with provider if in the yellow zone for 48 hours without improvement. Review of monitoring for changes and taking medications as directed. Discussed calling the office for changes or new concerns with COPD and effective management of COPD. Advised patient to engage in light exercise as tolerated 3-5 days a week to aid in the the management of COPD Provided education about and advised patient to utilize infection prevention strategies to reduce risk of respiratory infection. Review of risk factors Discussed the importance of adequate rest and management of fatigue with COPD Screening for signs and symptoms of depression related to chronic disease state  Assessed social determinant of health barriers  Symptom Management: Attend all scheduled provider appointments Call provider office for new concerns or questions  call the Suicide and Crisis Lifeline: 988 call the [place] National Suicide Prevention Lifeline: 812-515-7557 or TTY: 2268785823 TTY 781-615-1600) to talk to a trained counselor call 1-800-273-TALK (toll free, 24 hour hotline) if experiencing a Mental Health or [place]  avoid second hand smoke eliminate smoking in my home identify and remove indoor air pollutants limit outdoor activity during cold weather listen for public air quality announcements every day develop a rescue plan eliminate symptom triggers at home follow rescue plan if symptoms flare-up develop a new routine to improve sleep get  at least 7 to 8 hours of sleep at night  Follow Up Plan: Telephone follow up appointment with care management team member scheduled for: 02-16-2023 at 9 am       COMPLETED: RNCM: Need help with paperwork       Care Coordination Interventions: Closing goal-goal met. Also see new plan of care for CCM Patient called to talk with [facility] about paperwork she needed signed from the pcp. Education and support given Advised patient to wait for Rachell to call the patient back and let her know if the paperwork she had sent to the office for the pcp to sign has come to the office fax.  Provided education to patient re: getting Dr. Parks Ranger to sign the paperwork and would get it faxed back to the patient at her apartment complex for expressed needs.  Collaborated with office staff regarding paperwork faxed to the [facility] attention that needed to be reviewed by Dr. Parks Ranger and signed. The paperwork has been received and Rachell will give to Dr. Parks Ranger to signing and return fax to the apartment complex Discussed plans with patient for ongoing care management follow up and provided patient with direct contact information for care management team Advised patient to discuss new concerns and changes with provider           Plan:Telephone follow up appointment with care management team  member scheduled for:  02-16-2023 at 0900 am  [place], MSN, CCM RN Care Manager  Chronic Care Management Direct Number: [phone]

## 2022-12-08 NOTE — Patient Instructions (Signed)
Please call the care guide team at [phone] if you need to cancel or reschedule your appointment.   If you are experiencing a Mental Health or [place] or need someone to talk to, please call the Suicide and Crisis Lifeline: 988 call the [place] National Suicide Prevention Lifeline: (806)794-2766 or TTY: 9348105717 TTY 520-754-7172) to talk to a trained counselor call 1-800-273-TALK (toll free, 24 hour hotline)   Following is a copy of the CCM Program Consent:  CCM service includes personalized support from designated clinical staff supervised by the physician, including individualized plan of care and coordination with other care providers 24/7 contact phone numbers for assistance for urgent and routine care needs. Service will only be billed when office clinical staff spend 20 minutes or more in a month to coordinate care. Only one practitioner may furnish and bill the service in a calendar month. The patient may stop CCM services at amy time (effective at the end of the month) by phone call to the office staff. The patient will be responsible for cost sharing (co-pay) or up to 20% of the service fee (after annual deductible is met)  Following is a copy of your full provider care plan:   Goals Addressed             This Visit's Progress    CCM Expected Outcome:  Monitor, Self-Manage and Reduce Symptoms of: Pre-Diabetes       Current Barriers:  Knowledge Deficits related to dietary changes to help with managing pre-DM Chronic Disease Management support and education needs related to effective management of pre-DM with hospitalization for GI bleed in September  Lab Results  Component Value Date   HGBA1C 5.7 (A) 12/24/2021     Planned Interventions: Provided education to patient about basic DM disease process; Reviewed prescribed diet with patient heart healthy/ADA diet. Review of dietary restrictions; Counseled on importance of regular laboratory monitoring as  prescribed;        Discussed plans with patient for ongoing care management follow up and provided patient with direct contact information for care management team;      Provided patient with written educational materials related to hypo and hyperglycemia and importance of correct treatment;       Reviewed scheduled/upcoming provider appointments including: 02-10-2023        call provider for findings outside established parameters;       Review of patient status, including review of consultants reports, relevant laboratory and other test results, and medications completed;       Advised patient to discuss changes that would warrant taking medications for DM, questions or concerns related to effective management of DM with provider;      The patient was in the hospital for a GI bleed in September. Review of the letter the patient received from Dr. Vicente Males concerning her EGD and biopsy report. The patient has a call into the GI doctor to see about repeat EGD since it is coming up on the 8 week mark post EGD and needs a follow up with GI doctor and per instructions from the discharge summary the patient is to have a repeat EGD due to bleeding  ulcers and medications management plan of care. Will continue to monitor. The patient states her GI symptoms are better. She is still going to see the specialist in March. The patient states that she has been more anxious than usual and the pcp is working with her on this. She got some peppermint capsules to use  and this is helping. The pcp recommended this for her for her GI issues. Assisted the patient in finding a cheaper option of Peppermint capsules on [place]. She is going to check into this and have some on hand if she needs them. She will also let the office know if she needs additional anxiety medications.   Symptom Management: Attend all scheduled provider appointments Call provider office for new concerns or questions  call the Suicide and Crisis Lifeline:  988 call the [place] National Suicide Prevention Lifeline: (717)627-8719 or TTY: 7314435446 TTY 737-724-1705) to talk to a trained counselor call 1-800-273-TALK (toll free, 24 hour hotline) if experiencing a Mental Health or [place]  check feet daily for cuts, sores or redness trim toenails straight across manage portion size wash and dry feet carefully every day wear comfortable, cotton socks wear comfortable, well-fitting shoes  Follow Up Plan: Telephone follow up appointment with care management team member scheduled for: 02-16-2023 at 9 am       CCM Expected Outcome:  Monitor, Self-Manage, and Reduce Symptoms of Hypertension       Current Barriers:  Knowledge Deficits related to controlling blood pressures and monitoring for changes in blood pressure Care Coordination needs related to knowing how to effectively manage blood pressures and calling the office for changes in blood pressures and when to take medications and when to hold in a patient with HTN Chronic Disease Management support and education needs related to effective management of HTN  BP Readings from Last 3 Encounters:  12/02/22 119/64  08/30/22 128/60  08/18/22 115/65     Planned Interventions: Evaluation of current treatment plan related to hypertension self management and patient's adherence to plan as established by provider. The patient has stable blood pressures, no acute changes noted;   Provided education to patient re: stroke prevention, s/s of heart attack and stroke; Reviewed prescribed diet heart healthy diet, avoiding spicy foods due to recent GI Bleed. GI sx and sx have improved and the patient is using Peppermint capsules per directions of the pcp. Reviewed medications with patient and discussed importance of compliance. The patient is compliant with medications;  Discussed plans with patient for ongoing care management follow up and provided patient with direct contact information for  care management team; Advised patient, providing education and rationale, to monitor blood pressure daily and record, calling PCP for findings outside established parameters;  Reviewed scheduled/upcoming provider appointments including: 02-10-2023, saw pcp on 12-02-2022- had changes in medications Advised patient to discuss changes in blood pressures that warrant changes in medications or plan of care with provider; Provided education on prescribed diet heart healthy diet, avoiding spicy foods;  Discussed complications of poorly controlled blood pressure such as heart disease, stroke, circulatory complications, vision complications, kidney impairment, sexual dysfunction;  Screening for signs and symptoms of depression related to chronic disease state;  Assessed social determinant of health barriers;   Symptom Management: Take medications as prescribed   Attend all scheduled provider appointments Call pharmacy for medication refills 3-7 days in advance of running out of medications Call provider office for new concerns or questions  call the Suicide and Crisis Lifeline: 988 call the [place] National Suicide Prevention Lifeline: (763) 845-8138 or TTY: 336-423-2429 TTY (902)534-3680) to talk to a trained counselor call 1-800-273-TALK (toll free, 24 hour hotline) if experiencing a Mental Health or [place]   Follow Up Plan: Telephone follow up appointment with care management team member scheduled for: 02-16-2023 at 0900 am  CCM:  Maintain, Monitor and Self-Manage Symptoms of COPD       Current Barriers:  Knowledge Deficits related to triggers and factors that may cause COPD Exacerbation Chronic Disease Management support and education needs related to COPD  Planned Interventions: Provided patient with basic written and verbal COPD education on self care/management/and exacerbation prevention. 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 She says she will work a little longer but she thinks she is going to have to stop working because of the sickness she has been experiencing. Advised patient to track and manage COPD triggers. Review of changes in weather and monitoring for others around her being sick, education on staying safe and protecting self during peak sickness times.  Provided  verbal instructions on pursed lip breathing and utilized returned demonstration as teach back Advised patient to self assesses COPD action plan zone and make appointment with provider if in the yellow zone for 48 hours without improvement. Review of monitoring for changes and taking medications as directed. Discussed calling the office for changes or new concerns with COPD and effective management of COPD. Advised patient to engage in light exercise as tolerated 3-5 days a week to aid in the the management of COPD Provided education about and advised patient to utilize infection prevention strategies to reduce risk of respiratory infection. Review of risk factors Discussed the importance of adequate rest and management of fatigue with COPD Screening for signs and symptoms of depression related to chronic disease state  Assessed social determinant of health barriers  Symptom Management: Attend all scheduled provider appointments Call provider office for new concerns or questions  call the Suicide and Crisis Lifeline: 988 call the [place] National Suicide Prevention Lifeline: 801-195-9754 or TTY: 220-132-9739 TTY (224)763-2355) to talk to a trained counselor call 1-800-273-TALK (toll free, 24 hour hotline) if experiencing a Mental Health or [place]  avoid second hand smoke eliminate smoking in my home identify and remove indoor air pollutants limit outdoor activity during cold weather listen for public air quality announcements every day develop a rescue plan eliminate symptom triggers at  home follow rescue plan if symptoms flare-up develop a new routine to improve sleep get at least 7 to 8 hours of sleep at night  Follow Up Plan: Telephone follow up appointment with care management team member scheduled for: 02-16-2023 at 9 am       COMPLETED: RNCM: Need help with paperwork       Care Coordination Interventions: Closing goal-goal met. Also see new plan of care for CCM Patient called to talk with [facility] about paperwork she needed signed from the pcp. Education and support given Advised patient to wait for Rachell to call the patient back and let her know if the paperwork she had sent to the office for the pcp to sign has come to the office fax.  Provided education to patient re: getting Dr. Parks Ranger to sign the paperwork and would get it faxed back to the patient at her apartment complex for expressed needs.  Collaborated with office staff regarding paperwork faxed to the [facility] attention that needed to be reviewed by Dr. Parks Ranger and signed. The paperwork has been received and Rachell will give to Dr. Parks Ranger to signing and return fax to the apartment complex Discussed plans with patient for ongoing care management follow up and provided patient with direct contact information for care  management team Advised patient to discuss new concerns and changes with provider           Patient verbalizes understanding of instructions and care plan provided today and agrees to view in [place]. Active MyChart status and patient understanding of how to access instructions and care plan via MyChart confirmed with patient.     Telephone follow up appointment with care management team member scheduled for: 02-16-2023 at 0900 am

## 2022-12-15 ENCOUNTER — Telehealth: Payer: 59

## 2022-12-15 ENCOUNTER — Other Ambulatory Visit: Payer: Self-pay | Admitting: Family Medicine

## 2022-12-15 ENCOUNTER — Ambulatory Visit: Payer: Self-pay

## 2022-12-15 DIAGNOSIS — I10 Essential (primary) hypertension: Secondary | ICD-10-CM

## 2022-12-15 DIAGNOSIS — R7303 Prediabetes: Secondary | ICD-10-CM

## 2022-12-15 DIAGNOSIS — Z1231 Encounter for screening mammogram for malignant neoplasm of breast: Secondary | ICD-10-CM

## 2022-12-15 NOTE — Patient Instructions (Signed)
Please call the care guide team at [phone] if you need to cancel or reschedule your appointment.   If you are experiencing a Mental Health or [place] or need someone to talk to, please call the Suicide and Crisis Lifeline: 988 call the [place] National Suicide Prevention Lifeline: 360-811-6747 or TTY: (860) 827-3419 TTY 551 392 4059) to talk to a trained counselor call 1-800-273-TALK (toll free, 24 hour hotline)   Following is a copy of the CCM Program Consent:  CCM service includes personalized support from designated clinical staff supervised by the physician, including individualized plan of care and coordination with other care providers 24/7 contact phone numbers for assistance for urgent and routine care needs. Service will only be billed when office clinical staff spend 20 minutes or more in a month to coordinate care. Only one practitioner may furnish and bill the service in a calendar month. The patient may stop CCM services at amy time (effective at the end of the month) by phone call to the office staff. The patient will be responsible for cost sharing (co-pay) or up to 20% of the service fee (after annual deductible is met)  Following is a copy of your full provider care plan:   Goals Addressed             This Visit's Progress    CCM Expected Outcome:  Monitor, Self-Manage and Reduce Symptoms of: Pre-Diabetes       Current Barriers:  Knowledge Deficits related to dietary changes to help with managing pre-DM Chronic Disease Management support and education needs related to effective management of pre-DM with hospitalization for GI bleed in September  Lab Results  Component Value Date   HGBA1C 5.7 (A) 12/24/2021     Planned Interventions: Provided education to patient about basic DM disease process; Reviewed prescribed diet with patient heart healthy/ADA diet. Review of dietary restrictions; Counseled on importance of regular laboratory monitoring as  prescribed;        Discussed plans with patient for ongoing care management follow up and provided patient with direct contact information for care management team;      Provided patient with written educational materials related to hypo and hyperglycemia and importance of correct treatment;       Reviewed scheduled/upcoming provider appointments including: 02-10-2023        call provider for findings outside established parameters;       Review of patient status, including review of consultants reports, relevant laboratory and other test results, and medications completed;       Advised patient to discuss changes that would warrant taking medications for DM, questions or concerns related to effective management of DM with provider;      The patient was in the hospital for a GI bleed in September. Review of the letter the patient received from Dr. Vicente Males concerning her EGD and biopsy report. The patient has a call into the GI doctor to see about repeat EGD since it is coming up on the 8 week mark post EGD and needs a follow up with GI doctor and per instructions from the discharge summary the patient is to have a repeat EGD due to bleeding  ulcers and medications management plan of care. Will continue to monitor. The patient states her GI symptoms are better. She is still going to see the specialist in March. The patient states that she has been more anxious than usual and the pcp is working with her on this. She got some peppermint capsules to use  and this is helping. The pcp recommended this for her for her GI issues. Assisted the patient in finding a cheaper option of Peppermint capsules on [place]. She is going to check into this and have some on hand if she needs them. She will also let the office know if she needs additional anxiety medications.  Incoming call from the patient needing assistance with he mammogram. She had received a letter in the mail from imaging in [place] but prefers to go to  [place]. Supplied the patient with the number [phone], [place] [facility]. The patient will call and set up an appointment. Will continue to monitor for changes, new needs.   Symptom Management: Attend all scheduled provider appointments Call provider office for new concerns or questions  call the Suicide and Crisis Lifeline: 988 call the [place] National Suicide Prevention Lifeline: 928-881-3423 or TTY: 956-052-4906 TTY 678-586-3090) to talk to a trained counselor call 1-800-273-TALK (toll free, 24 hour hotline) if experiencing a Mental Health or [place]  check feet daily for cuts, sores or redness trim toenails straight across manage portion size wash and dry feet carefully every day wear comfortable, cotton socks wear comfortable, well-fitting shoes  Follow Up Plan: Telephone follow up appointment with care management team member scheduled for: 02-16-2023 at 9 am       CCM Expected Outcome:  Monitor, Self-Manage, and Reduce Symptoms of Hypertension       Current Barriers:  Knowledge Deficits related to controlling blood pressures and monitoring for changes in blood pressure Care Coordination needs related to knowing how to effectively manage blood pressures and calling the office for changes in blood pressures and when to take medications and when to hold in a patient with HTN Chronic Disease Management support and education needs related to effective management of HTN  BP Readings from Last 3 Encounters:  12/02/22 119/64  08/30/22 128/60  08/18/22 115/65     Planned Interventions: Evaluation of current treatment plan related to hypertension self management and patient's adherence to plan as established by provider. The patient has stable blood pressures, no acute changes noted;   Provided education to patient re: stroke prevention, s/s of heart attack and stroke; Reviewed prescribed diet heart healthy diet, avoiding  spicy foods due to recent GI Bleed. GI sx and sx have improved and the patient is using Peppermint capsules per directions of the pcp. Reviewed medications with patient and discussed importance of compliance. The patient is compliant with medications;  Discussed plans with patient for ongoing care management follow up and provided patient with direct contact information for care management team; Advised patient, providing education and rationale, to monitor blood pressure daily and record, calling PCP for findings outside established parameters;  Reviewed scheduled/upcoming provider appointments including: 02-10-2023, saw pcp on 12-02-2022- had changes in medications Advised patient to discuss changes in blood pressures that warrant changes in medications or plan of care with provider; Provided education on prescribed diet heart healthy diet, avoiding spicy foods;  Discussed complications of poorly controlled blood pressure such as heart disease, stroke, circulatory complications, vision complications, kidney impairment, sexual dysfunction;  Screening for signs and symptoms of depression related to chronic disease state;  Assessed social determinant of health barriers;  The patient is wanting to get her preventative health done. The patient received a letter about her mammogram. Answered questions and assisted with scheduling of her mammogram locally.   Symptom Management: Take medications as prescribed   Attend all scheduled  provider appointments Call pharmacy for medication refills 3-7 days in advance of running out of medications Call provider office for new concerns or questions  call the Suicide and Crisis Lifeline: 988 call the [place] National Suicide Prevention Lifeline: 615-750-3819 or TTY: 213-209-3174 TTY 267-251-3928) to talk to a trained counselor call 1-800-273-TALK (toll free, 24 hour hotline) if experiencing a Mental Health or [place]   Follow Up Plan: Telephone  follow up appointment with care management team member scheduled for: 02-16-2023 at 0900 am          Patient verbalizes understanding of instructions and care plan provided today and agrees to view in [place]. Active MyChart status and patient understanding of how to access instructions and care plan via MyChart confirmed with patient.     Telephone follow up appointment with care management team member scheduled for: 02-16-2023 at 0900 am

## 2022-12-15 NOTE — Chronic Care Management (AMB) (Signed)
Chronic Care Management   CCM RN Visit Note  12/15/2022 Name: Cheryl Payne MRN: [id number] DOB: 1966-04-07  Subjective: Cheryl Payne is a 57 y.o. year old female who is a primary care patient of [name], DO. The patient was referred to the Chronic Care Management team for assistance with care management needs subsequent to provider initiation of CCM services and plan of care.    Today's Visit:  Engaged with patient by telephone for follow up visit.        Goals Addressed             This Visit's Progress    CCM Expected Outcome:  Monitor, Self-Manage and Reduce Symptoms of: Pre-Diabetes       Current Barriers:  Knowledge Deficits related to dietary changes to help with managing pre-DM Chronic Disease Management support and education needs related to effective management of pre-DM with hospitalization for GI bleed in September  Lab Results  Component Value Date   HGBA1C 5.7 (A) 12/24/2021     Planned Interventions: Provided education to patient about basic DM disease process; Reviewed prescribed diet with patient heart healthy/ADA diet. Review of dietary restrictions; Counseled on importance of regular laboratory monitoring as prescribed;        Discussed plans with patient for ongoing care management follow up and provided patient with direct contact information for care management team;      Provided patient with written educational materials related to hypo and hyperglycemia and importance of correct treatment;       Reviewed scheduled/upcoming provider appointments including: 02-10-2023        call provider for findings outside established parameters;       Review of patient status, including review of consultants reports, relevant laboratory and other test results, and medications completed;       Advised patient to discuss changes that would warrant taking medications for DM, questions or concerns related to effective management of DM with provider;       The patient was in the hospital for a GI bleed in September. Review of the letter the patient received from Dr. Vicente Males concerning her EGD and biopsy report. The patient has a call into the GI doctor to see about repeat EGD since it is coming up on the 8 week mark post EGD and needs a follow up with GI doctor and per instructions from the discharge summary the patient is to have a repeat EGD due to bleeding  ulcers and medications management plan of care. Will continue to monitor. The patient states her GI symptoms are better. She is still going to see the specialist in March. The patient states that she has been more anxious than usual and the pcp is working with her on this. She got some peppermint capsules to use and this is helping. The pcp recommended this for her for her GI issues. Assisted the patient in finding a cheaper option of Peppermint capsules on [place]. She is going to check into this and have some on hand if she needs them. She will also let the office know if she needs additional anxiety medications.  Incoming call from the patient needing assistance with he mammogram. She had received a letter in the mail from imaging in [place] but prefers to go to [place]. Supplied the patient with the number [phone], [place] at [facility]. The patient will call and set up an appointment. Will continue to monitor for changes, new needs.   Symptom  Management: Attend all scheduled provider appointments Call provider office for new concerns or questions  call the Suicide and Crisis Lifeline: 988 call the [place] National Suicide Prevention Lifeline: 581-422-5601 or TTY: (714)432-6484 TTY 620-303-2516) to talk to a trained counselor call 1-800-273-TALK (toll free, 24 hour hotline) if experiencing a Mental Health or [place]  check feet daily for cuts, sores or redness trim toenails straight across manage portion size wash and dry feet carefully  every day wear comfortable, cotton socks wear comfortable, well-fitting shoes  Follow Up Plan: Telephone follow up appointment with care management team member scheduled for: 02-16-2023 at 9 am       CCM Expected Outcome:  Monitor, Self-Manage, and Reduce Symptoms of Hypertension       Current Barriers:  Knowledge Deficits related to controlling blood pressures and monitoring for changes in blood pressure Care Coordination needs related to knowing how to effectively manage blood pressures and calling the office for changes in blood pressures and when to take medications and when to hold in a patient with HTN Chronic Disease Management support and education needs related to effective management of HTN  BP Readings from Last 3 Encounters:  12/02/22 119/64  08/30/22 128/60  08/18/22 115/65     Planned Interventions: Evaluation of current treatment plan related to hypertension self management and patient's adherence to plan as established by provider. The patient has stable blood pressures, no acute changes noted;   Provided education to patient re: stroke prevention, s/s of heart attack and stroke; Reviewed prescribed diet heart healthy diet, avoiding spicy foods due to recent GI Bleed. GI sx and sx have improved and the patient is using Peppermint capsules per directions of the pcp. Reviewed medications with patient and discussed importance of compliance. The patient is compliant with medications;  Discussed plans with patient for ongoing care management follow up and provided patient with direct contact information for care management team; Advised patient, providing education and rationale, to monitor blood pressure daily and record, calling PCP for findings outside established parameters;  Reviewed scheduled/upcoming provider appointments including: 02-10-2023, saw pcp on 12-02-2022- had changes in medications Advised patient to discuss changes in blood pressures that warrant changes in  medications or plan of care with provider; Provided education on prescribed diet heart healthy diet, avoiding spicy foods;  Discussed complications of poorly controlled blood pressure such as heart disease, stroke, circulatory complications, vision complications, kidney impairment, sexual dysfunction;  Screening for signs and symptoms of depression related to chronic disease state;  Assessed social determinant of health barriers;  The patient is wanting to get her preventative health done. The patient received a letter about her mammogram. Answered questions and assisted with scheduling of her mammogram locally.   Symptom Management: Take medications as prescribed   Attend all scheduled provider appointments Call pharmacy for medication refills 3-7 days in advance of running out of medications Call provider office for new concerns or questions  call the Suicide and Crisis Lifeline: 988 call the [place] National Suicide Prevention Lifeline: (769)800-9379 or TTY: 214-608-6389 TTY 838-525-1471) to talk to a trained counselor call 1-800-273-TALK (toll free, 24 hour hotline) if experiencing a Mental Health or [place]   Follow Up Plan: Telephone follow up appointment with care management team member scheduled for: 02-16-2023 at 0900 am          Plan:Telephone follow up appointment with care management team member scheduled for:  02-16-2023 at 0900 am  Noreene Larsson RN, MSN, CCM RN Care  Manager  Chronic Care Management Direct Number: [phone]

## 2023-01-04 ENCOUNTER — Ambulatory Visit
Admission: RE | Admit: 2023-01-04 | Discharge: 2023-01-04 | Disposition: A | Discharge status: Home / Self Care | Payer: 59 | Source: Ambulatory Visit | Attending: Family Medicine | Admitting: Family Medicine

## 2023-01-04 DIAGNOSIS — Z1231 Encounter for screening mammogram for malignant neoplasm of breast: Secondary | ICD-10-CM | POA: Insufficient documentation

## 2023-01-04 DIAGNOSIS — I10 Essential (primary) hypertension: Secondary | ICD-10-CM

## 2023-01-04 DIAGNOSIS — J449 Chronic obstructive pulmonary disease, unspecified: Secondary | ICD-10-CM

## 2023-01-17 DIAGNOSIS — C3411 Malignant neoplasm of upper lobe, right bronchus or lung: Secondary | ICD-10-CM | POA: Diagnosis not present

## 2023-01-19 DIAGNOSIS — C3411 Malignant neoplasm of upper lobe, right bronchus or lung: Secondary | ICD-10-CM | POA: Diagnosis not present

## 2023-02-07 ENCOUNTER — Ambulatory Visit (INDEPENDENT_AMBULATORY_CARE_PROVIDER_SITE_OTHER): Payer: 59 | Admitting: Gastroenterology

## 2023-02-07 ENCOUNTER — Encounter: Payer: Self-pay | Admitting: Gastroenterology

## 2023-02-07 ENCOUNTER — Other Ambulatory Visit: Payer: Self-pay

## 2023-02-07 VITALS — BP 132/82 | HR 76 | Temp 98.7°F | Ht 64.0 in | Wt 199.2 lb

## 2023-02-07 DIAGNOSIS — K259 Gastric ulcer, unspecified as acute or chronic, without hemorrhage or perforation: Secondary | ICD-10-CM | POA: Diagnosis not present

## 2023-02-07 DIAGNOSIS — Z8719 Personal history of other diseases of the digestive system: Secondary | ICD-10-CM

## 2023-02-07 DIAGNOSIS — R58 Hemorrhage, not elsewhere classified: Secondary | ICD-10-CM

## 2023-02-07 MED ORDER — NA SULFATE-K SULFATE-MG SULF 17.5-3.13-1.6 GM/177ML PO SOLN: 354.0000 mL | Freq: Once | ORAL | 0 refills | Status: AC

## 2023-02-07 NOTE — Addendum Note (Signed)
Addended by: Wayna Chalet on: 02/07/2023 02:42 PM   Modules accepted: Orders

## 2023-02-07 NOTE — Progress Notes (Signed)
Jonathon Bellows MD, MRCP(U.K) [address]  [place]  [place], [place] [address]  Main: [phone]  Fax: [phone]   Primary Care Physician: [name], DO  Primary Gastroenterologist:  Dr. Jonathon Bellows   Chief Complaint  Patient presents with   Hematemesis    HPI: Cheryl Payne is a 57 y.o. female   Summary of history :  I had seen her back in 08/24/2022 when she presented to the hospital with hematemesis after nausea and vomiting.  On admission her hemoglobin was 9.3 g with a BUN of 69.  She had been on meloxicam.  I put formed an upper endoscopy on 08/17/2022 that showed erythematous appearing mucosa in the duodenal bulb and a linear gastric ulcers with clean base in the greater curvature the stomach largest was 10 mm in size biopsies were taken 15 mm mucosal nodule was seen at the GE junction biopsies were taken.  Plan was to repeat EGD in 8 weeks to check for healing of the ulcers.  Biopsies of the duodenum showed peptic duodenitis and the biopsies of stomach showed reactive gastropathy GE junction nodule features of reflux esophagitis was seen no evidence of malignancy was seen.  Interval history   08/17/2022-02/07/2023 01/17/2023 hemoglobin 13.6 g CMP shows no abnormality.  His hospital discharge denies any black stools.  Doing well.  Complains some blood on the tissue paper when she has a bowel movement she has tried high-fiber diet has not worked never had a colonoscopy.  Denies any constipation.  Not on any PPI presently.    Current Outpatient Medications  Medication Sig Dispense Refill   amLODipine (NORVASC) 5 MG tablet Take 1 tablet (5 mg total) by mouth daily. (Note this medication has been temporarily HELD, if BP increases in future, please fill and start taking) 90 tablet 3   escitalopram (LEXAPRO) 20 MG tablet Take 1 tablet (20 mg total) by mouth at bedtime. 90 tablet 3   gabapentin (NEURONTIN) 300 MG capsule Take 2 capsules (600 mg total) by mouth  at bedtime. 180 capsule 3   rosuvastatin (CRESTOR) 10 MG tablet TAKE 1 TABLET(10 MG) BY MOUTH DAILY 90 tablet 3   No current facility-administered medications for this visit.    Allergies as of 02/07/2023   (No Known Allergies)    ROS:  General: Negative for anorexia, weight loss, fever, chills, fatigue, weakness. ENT: Negative for hoarseness, difficulty swallowing , nasal congestion. CV: Negative for chest pain, angina, palpitations, dyspnea on exertion, peripheral edema.  Respiratory: Negative for dyspnea at rest, dyspnea on exertion, cough, sputum, wheezing.  GI: See history of present illness. GU:  Negative for dysuria, hematuria, urinary incontinence, urinary frequency, nocturnal urination.  Endo: Negative for unusual weight change.    Physical Examination:   BP 132/82   Pulse 76   Temp 98.7 F (37.1 C) (Oral)   Ht '5\' 4"'$  (1.626 m)   Wt 199 lb 3.2 oz (90.4 kg)   BMI 34.19 kg/m   General: Well-nourished, well-developed in no acute distress.  Eyes: No icterus. Conjunctivae pink. Neuro: Alert and oriented x 3.  Grossly intact. Skin: Warm and dry, no jaundice.   Psych: Alert and cooperative, normal mood and affect.   Imaging Studies: No results found.  Assessment and Plan:   Cheryl Payne is a 57 y.o. y/o female here today to see me as a hospital follow-up back in 08/24/2022 she was admitted with an upper GI bleed while on long-term use of meloxicam was found  to have multiple ulcers in the stomach the plan was to perform an endoscopy in 8 weeks to check for healing of the ulcers was lost to follow-up and today she is back.  She also has some blood on the tissue paper likely hemorrhoids but never had a colonoscopy we will evaluate she has failed conservative management if the bleeding persists and hemorrhoids are seen we will bring her back for hemorrhoidal banding  Plan 1.  Avoid NSAIDs 2.  H. pylori breath test 3.  Repeat EGD to check for healing of the gastric  ulcers and nodule that was seen at the GE junction biopsies of which were benign if it persist will need EUS 4.  Colonoscopy on the same day to evaluate for rectal bleeding  I have discussed alternative options, risks & benefits,  which include, but are not limited to, bleeding, infection, perforation,respiratory complication & drug reaction.  The patient agrees with this plan & written consent will be obtained.       Dr Jonathon Bellows  MD,MRCP [facility]) Follow up in 6 to 8 weeks for hemorrhoidal banding

## 2023-02-09 LAB — H. PYLORI BREATH TEST: H pylori Breath Test: NEGATIVE

## 2023-02-10 ENCOUNTER — Ambulatory Visit (INDEPENDENT_AMBULATORY_CARE_PROVIDER_SITE_OTHER): Payer: 59 | Admitting: Family Medicine

## 2023-02-10 VITALS — BP 126/78 | HR 75 | Ht 64.0 in | Wt 200.8 lb

## 2023-02-10 DIAGNOSIS — K279 Peptic ulcer, site unspecified, unspecified as acute or chronic, without hemorrhage or perforation: Secondary | ICD-10-CM | POA: Diagnosis not present

## 2023-02-10 DIAGNOSIS — I10 Essential (primary) hypertension: Secondary | ICD-10-CM

## 2023-02-10 DIAGNOSIS — R7303 Prediabetes: Secondary | ICD-10-CM

## 2023-02-10 DIAGNOSIS — F411 Generalized anxiety disorder: Secondary | ICD-10-CM

## 2023-02-10 MED ORDER — OMEPRAZOLE 40 MG PO CPDR: 40.0000 mg | DELAYED_RELEASE_CAPSULE | Freq: Every day | ORAL | 1 refills | Status: DC

## 2023-02-10 MED ORDER — BUSPIRONE HCL 5 MG PO TABS: 5.0000 mg | ORAL_TABLET | Freq: Three times a day (TID) | ORAL | 2 refills | Status: DC | PRN

## 2023-02-10 NOTE — Progress Notes (Unsigned)
Subjective:    Patient ID: Cheryl Payne, female    DOB: November 28, 1966, 57 y.o.   MRN: [id number]  Cheryl Payne is a 57 y.o. female presenting on 02/10/2023 for Anxiety, Hypertension, Hyperlipidemia, and Shoulder Pain   HPI  GERD Last visit with Dr Vicente Males on 02/07/23, they discussed history of GI bleed, previously with gastric ulcers, had EGD 08/17/22, and treated with PPI since that time. No further GI bleeding. She is now going to have repeat EGD to check for healing ulcers and colonoscopy as well. She finished 3 month PPI Omeprazole '40mg'$ , then came off. Now she asks if she should be on PPI again, as GI did not clearly state this until she has EGD  Generalized Anxiety Disorder Major Depression, chronic recurrent mild - partial remission Chronic history 10+ years in past, has had primarily issues with generalized anxiety disorder Last visit 12/02/22, we dose increased Escitalopram from 10 to '20mg'$  daily She is interested in some medication during the day anxiety   Hypertension Last result lipid 08/2021 - Currently taking FULL DOSE now Rosuvastatin '10mg'$ , tolerating well without side effects or myalgias - Taking Amlodipine '5mg'$  daily (has not taken today, until after blood work) - Taking Metoprolol tartrate '25mg'$  BID for cardioprotection ***Bp has been controlled lately, doing well  Left Shoulder Chronic Pain Bursitis Previously treated in 07/2020 and 2022, has improved in past 1 year with steroid injection with good results. Last treated 03/11/22 for same problem, chronic bursitis, she improved but it did not resolve. Now returns 1 year later still with same issue. Left shoulder, worse at night.  She has tried other medications muscle relaxants. Limited improve Working with PT but not for shoulder yet. Request repeat injection now 1 year later Denies injury fall numbness tingling   Health Maintenance: ***     02/10/2023    8:07 AM 12/02/2022    8:08 AM 07/15/2022    9:07 AM  Depression  screen PHQ 2/9  Decreased Interest 1 1 0  Down, Depressed, Hopeless 0 1 1  PHQ - 2 Score '1 2 1  '$ Altered sleeping 1 2 0  Tired, decreased energy '1 1 1  '$ Change in appetite 0 1 0  Feeling bad or failure about yourself  0 0 0  Trouble concentrating 0 0 0  Moving slowly or fidgety/restless 0 0 0  Suicidal thoughts 0 0 0  PHQ-9 Score '3 6 2  '$ Difficult doing work/chores Not difficult at all Not difficult at all Not difficult at all    Social History   Tobacco Use   Smoking status: Former    Packs/day: 1.00    Years: 35.00    Total pack years: 35.00    Types: Cigarettes   Smokeless tobacco: Former    Quit date: 06/04/2018  Vaping Use   Vaping Use: Never used  Substance Use Topics   Alcohol use: Never   Drug use: Never    Review of Systems Per HPI unless specifically indicated above     Objective:    BP 126/78   Pulse 75   Ht '5\' 4"'$  (1.626 m)   Wt 200 lb 12.8 oz (91.1 kg)   SpO2 99%   BMI 34.47 kg/m   Wt Readings from Last 3 Encounters:  02/10/23 200 lb 12.8 oz (91.1 kg)  02/07/23 199 lb 3.2 oz (90.4 kg)  12/02/22 194 lb (88 kg)    Physical Exam   I have personally reviewed the radiology report from 08/23/22 on Left  Shoulder X-ray.  EXAM: Left Shoulder Radiographs - 3 views ([place], Axillary, Scapular Y) performed 08/23/2022  CLINICAL INFORMATION: Chronic left shoulder pain  COMPARISON: None  FINDINGS:  Type II acromion. There is os acromiale with osteophytes. Mild AC joint change with osteophyte formation. The distal clavicle, scapula, and proximal humerus are intact. Glenohumeral and acromioclavicular joint alignment appears normal. No fractures or dislocations. Well maintained glenohumeral joint space without evidence of degenerative joint disease. No soft tissue swelling or joint effusion. No destructive bony lesion is identified. Mild enthesopathic changes around the shoulder.    Results for orders placed or performed in visit on 02/07/23  H. pylori breath  test  Result Value Ref Range   H pylori Breath Test Negative Negative      Assessment & Plan:   Problem List Items Addressed This Visit     Essential hypertension   GAD (generalized anxiety disorder)   Relevant Medications   busPIRone (BUSPAR) 5 MG tablet   Other Visit Diagnoses     Pre-diabetes    -  Primary   PUD (peptic ulcer disease)       Relevant Medications   omeprazole (PRILOSEC) 40 MG capsule         Meds ordered this encounter  Medications   omeprazole (PRILOSEC) 40 MG capsule    Sig: Take 1 capsule (40 mg total) by mouth daily before breakfast.    Dispense:  90 capsule    Refill:  1   busPIRone (BUSPAR) 5 MG tablet    Sig: Take 1 tablet (5 mg total) by mouth 3 (three) times daily as needed (anxiety).    Dispense:  90 tablet    Refill:  2     Follow up plan: Return in about 6 months (around 08/13/2023) for 6 month fasting lab then 1 week later Annual Physical.  Future labs ordered for ***   Nobie Putnam, DO [facility] 02/10/2023, 8:16 AM

## 2023-02-10 NOTE — Patient Instructions (Addendum)
Thank you for coming to the office today.  Restart Omeprazole '40mg'$  daily for stomach acid  Follow up with GI after they do the Endoscopy and ask if you need to continue the Omeprazole.  Start new Anxiety medication - Buspar '5mg'$  up to max 3 times a day, prefer 1-2 times every day and then extra doses can be added only if needed. You may skip some days if need.  Keep taking Escitalopram '20mg'$  daily.   Please call [facility] back to follow-up the Left Shoulder, last visit with them was September 2023.  Rosalia Hammers, [address]  [place], [place] [address]  [phone] (Work)   DUE for [place] (no food or drink after midnight before the lab appointment, only water or coffee without cream/sugar on the morning of)  SCHEDULE "Lab Only" visit in the morning at the clinic for lab draw in 5-6 MONTHS   - Make sure Lab Only appointment is at about 1 week before your next appointment, so that results will be available  For Lab Results, once available within 2-3 days of blood draw, you can can log in to MyChart online to view your results and a brief explanation. Also, we can discuss results at next follow-up visit.    Please schedule a Follow-up Appointment to: Return in about 6 months (around 08/13/2023) for 6 month fasting lab then 1 week later Annual Physical.  If you have any other questions or concerns, please feel free to call the office or send a message through [facility]. You may also schedule an earlier appointment if necessary.  Additionally, you may be receiving a survey about your experience at our office within a few days to 1 week by e-mail or mail. We value your feedback.  Nobie Putnam, DO [place] [facility]

## 2023-02-12 ENCOUNTER — Other Ambulatory Visit: Payer: Self-pay | Admitting: Family Medicine

## 2023-02-12 DIAGNOSIS — G8929 Other chronic pain: Secondary | ICD-10-CM

## 2023-02-13 ENCOUNTER — Ambulatory Visit: Payer: Self-pay

## 2023-02-13 NOTE — Telephone Encounter (Signed)
  Chief Complaint: medication problem Symptoms: leg cramping and abdominal cramping Frequency: ongoing for months Pertinent Negatives: NA Disposition: [] ED /[] Urgent Care (no appt availability in office) / [] Appointment(In office/virtual)/ []  [facility]/ [] Home Care/ [] Refused Recommended Disposition /[] [facility]/ [x]  Follow-up with PCP Additional Notes: pt states that she just now found out that her sx can be SE from the rosuvastatin. Pharmacist told her about that and recommended she call PCP. Pt states she hasn't been taking the medication every day because of the SE. Pt is asking for alternative or to be off medication completely. Advised pt to stop taking for now until FU with PCP. Pt just had OV on 02/10/23 and was mentioned but didn't go in detail. Advised pt I would send message back and have nurse FU with her. Pt verbalized understanding.   Summary: Pt stated the rosuvastatin (CRESTOR) 10 MG tablet is causing cramping   Pt stated the rosuvastatin (CRESTOR) 10 MG tablet is causing cramping and she would like to requests a different Rx. Pt stated she mentioned this during her appt but then they starting talking about other things.         Reason for Disposition  [1] Caller has URGENT medicine question about med that PCP or specialist prescribed AND [2] triager unable to answer question  Answer Assessment - Initial Assessment Questions 1. NAME of MEDICINE: "What medicine(s) are you calling about?"     Rosuvastatin  2. QUESTION: "What is your question?" (e.g., double dose of medicine, side effect)     Having cramping and requesting different medication 3. PRESCRIBER: "Who prescribed the medicine?" Reason: if prescribed by specialist, call should be referred to that group.     Dr. Raliegh Ip 4. SYMPTOMS: "Do you have any symptoms?" If Yes, ask: "What symptoms are you having?"  "How bad are the symptoms (e.g., mild, moderate, severe)     Leg cramping and abdominal  cramping  Protocols used: Medication Question Call-A-AH

## 2023-02-13 NOTE — Telephone Encounter (Signed)
Unable to refill per protocol, Rx request is too soon. Last refill 12/02/22 for 90 days and 3 refills.  Requested Prescriptions  Pending Prescriptions Disp Refills   gabapentin (NEURONTIN) 300 MG capsule [Pharmacy Med Name: GABAPENTIN '300MG'$  CAPSULES] 90 capsule     Sig: TAKE 1 CAPSULE BY MOUTH AT BEDTIME. MAY INCREASE TO 2 CAPSULES AT BEDTIME AS TOLERATED. ALSO. MAY TAKE 1 CAPSULE DURNING DAY AS NEEDED     Neurology: Anticonvulsants - gabapentin Passed - 02/12/2023  6:30 AM      Passed - Cr in normal range and within 360 days    Creat  Date Value Ref Range Status  08/30/2022 0.81 0.50 - 1.03 mg/dL Final         Passed - Completed PHQ-2 or PHQ-9 in the last 360 days      Passed - Valid encounter within last 12 months    Recent Outpatient Visits           3 days ago [place], DO   2 months ago Essential hypertension   [place], DO   5 months ago Upper GI bleed   [place], [place]   11 months ago Chronic left shoulder pain   [place], DO   1 year ago Vaginal discharge   [place], [place], DO       Future Appointments             In 1 month Jonathon Bellows, [facility] [place]   In 6 months [place], [facility], [facility]

## 2023-02-15 NOTE — Telephone Encounter (Signed)
I attempted to call her, did not reach. I left her a voicemail.  Please notify patient of the following:  Yes All Statin cholesterol medications can cause muscle aches and cramps as a possible side effect. It is not a guarantee, but it can happen. This may be part of your symptoms. However I know we discussed your shoulder specifically. Usually the muscle cramps from these meds are all over whole body or both arms and legs etc, not just one joint.  I would suggest HOLD medication for a few weeks until you are symptom or side effect free. Then we have a few options.  Keep Rosuvastatin '10mg'$  tablets but take HALF dose = '5mg'$  daily. Take the half dose '5mg'$  but take it intermittently (not every day). You could do HALF dose '5mg'$  twice a week and then gradually build up to 3 times a week.  Switch the med - we can try a different version for you, at a low dose. Just let me know and I can send an alternative version.  Let me know which option she chooses  Nobie Putnam, [facility] 02/15/2023, 5:34 PM

## 2023-02-16 ENCOUNTER — Telehealth: Payer: 59

## 2023-02-16 ENCOUNTER — Other Ambulatory Visit: Payer: Self-pay | Admitting: Family Medicine

## 2023-02-16 ENCOUNTER — Ambulatory Visit (INDEPENDENT_AMBULATORY_CARE_PROVIDER_SITE_OTHER): Payer: 59

## 2023-02-16 DIAGNOSIS — R7303 Prediabetes: Secondary | ICD-10-CM

## 2023-02-16 DIAGNOSIS — J432 Centrilobular emphysema: Secondary | ICD-10-CM

## 2023-02-16 DIAGNOSIS — I10 Essential (primary) hypertension: Secondary | ICD-10-CM

## 2023-02-16 DIAGNOSIS — E7800 Pure hypercholesterolemia, unspecified: Secondary | ICD-10-CM

## 2023-02-16 NOTE — Chronic Care Management (AMB) (Signed)
Chronic Care Management   CCM RN Visit Note  02/16/2023 Name: Cheryl Payne MRN: [id number] DOB: [birth year]  Subjective: Cheryl Payne is a 57 y.o. year old female who is a primary care patient of [name], DO. The patient was referred to the Chronic Care Management team for assistance with care management needs subsequent to provider initiation of CCM services and plan of care.    Today's Visit:  Engaged with patient by telephone for follow up visit.        Goals Addressed             This Visit's Progress    CCM Expected Outcome:  Monitor, Self-Manage and Reduce Symptoms of: Pre-Diabetes       Current Barriers:  Knowledge Deficits related to dietary changes to help with managing pre-DM Chronic Disease Management support and education needs related to effective management of pre-DM with hospitalization for GI bleed in September  Lab Results  Component Value Date   HGBA1C 5.7 (A) 12/24/2021     Planned Interventions: Provided education to patient about basic DM disease process. The patient is doing well. She states that she needs to reschedule her upcoming colonoscopy due to having to go to [place] due to a family illness. Education and support given. ; Reviewed prescribed diet with patient heart healthy/ADA diet. Review of dietary restrictions. The patient is mindful of what she is eating especially since she has ulcers; Counseled on importance of regular laboratory monitoring as prescribed;        Discussed plans with patient for ongoing care management follow up and provided patient with direct contact information for care management team;      Provided patient with written educational materials related to hypo and hyperglycemia and importance of correct treatment;       Reviewed scheduled/upcoming provider appointments including: 08-18-2023        call provider for findings outside established parameters;       Review of patient status, including review  of consultants reports, relevant laboratory and other test results, and medications completed;       Advised patient to discuss changes that would warrant taking medications for DM, questions or concerns related to effective management of DM with provider;      The patient was in the hospital for a GI bleed in September. The patient states her GI symptoms are better.  The patient states her GI symptoms are stable. The patient states that she is going to get her colonoscopy rescheduled. Education and support given.    Symptom Management: Attend all scheduled provider appointments Call provider office for new concerns or questions  call the Suicide and Crisis Lifeline: 988 call the [place] National Suicide Prevention Lifeline: 737-813-5035 or TTY: 270-764-3535 TTY (408)103-1538) to talk to a trained counselor call 1-800-273-TALK (toll free, 24 hour hotline) if experiencing a Mental Health or [place]  check feet daily for cuts, sores or redness trim toenails straight across manage portion size wash and dry feet carefully every day wear comfortable, cotton socks wear comfortable, well-fitting shoes  Follow Up Plan: Telephone follow up appointment with care management team member scheduled for: 04-27-2023 at 9 am       CCM Expected Outcome:  Monitor, Self-Manage, and Reduce Symptoms of Hypertension       Current Barriers:  Knowledge Deficits related to controlling blood pressures and monitoring for changes in blood pressure Care Coordination needs related to knowing how to effectively manage blood pressures and calling the  office for changes in blood pressures and when to take medications and when to hold in a patient with HTN Chronic Disease Management support and education needs related to effective management of HTN  BP Readings from Last 3 Encounters:  02/10/23 126/78  02/07/23 132/82  12/02/22 119/64     Planned Interventions: Evaluation of current treatment plan  related to hypertension self management and patient's adherence to plan as established by provider. The patient has stable blood pressures, no acute changes noted;  The patient blood pressures are stable. Denies any new concerns related to HTN Provided education to patient re: stroke prevention, s/s of heart attack and stroke; Reviewed prescribed diet heart healthy diet, avoiding spicy foods due to recent GI Bleed. GI sx and sx have improved and the patient is using Peppermint capsules per directions of the pcp. Reviewed medications with patient and discussed importance of compliance. The patient is compliant with medications. The patient was concerned about her HLD medications and Crestor. The patient heard from her pharmacy that [place] be causing her to have cramping. The patient states that she stopped taking it and called the office. Review of the notes from the pcp and talked at length with the patient about it. She has not taken in 3 days. She is going to start back taking every other day and see how that does and see if she can tell a difference in the cramping of her legs. Education provided. Will let the pcp know what her current plan is. The patient will call for any new changes or concerns;  Discussed plans with patient for ongoing care management follow up and provided patient with direct contact information for care management team; Advised patient, providing education and rationale, to monitor blood pressure daily and record, calling PCP for findings outside established parameters;  Reviewed scheduled/upcoming provider appointments including: saw pcp on 02-10-2023, next appointment on 08-18-2023. Knows to call for changes or new needs. Advised patient to discuss changes in blood pressures that warrant changes in medications or plan of care with provider; Provided education on prescribed diet heart healthy diet, avoiding spicy foods;  Discussed complications of poorly controlled blood  pressure such as heart disease, stroke, circulatory complications, vision complications, kidney impairment, sexual dysfunction;  Screening for signs and symptoms of depression related to chronic disease state;  Assessed social determinant of health barriers;  The patient is wanting to get her preventative health done. The patient received a letter about her mammogram. Answered questions and assisted with scheduling of her mammogram locally.   Symptom Management: Take medications as prescribed   Attend all scheduled provider appointments Call pharmacy for medication refills 3-7 days in advance of running out of medications Call provider office for new concerns or questions  call the Suicide and Crisis Lifeline: 988 call the [place] National Suicide Prevention Lifeline: 906-850-9952 or TTY: 425-123-3142 TTY 813-653-6047) to talk to a trained counselor call 1-800-273-TALK (toll free, 24 hour hotline) if experiencing a Mental Health or [place]   Follow Up Plan: Telephone follow up appointment with care management team member scheduled for: 04-27-2023 at 0900 am       CCM:  Maintain, Monitor and Self-Manage Symptoms of COPD       Current Barriers:  Knowledge Deficits related to triggers and factors that may cause COPD Exacerbation Chronic Disease Management support and education needs related to COPD  Planned Interventions: Provided patient with basic written and verbal COPD education on self care/management/and exacerbation prevention. The patient  is no longer working and states her COPD is stable. Did get some eye drops by visine that are working well for her when she is experiencing allergies related to eyes and drainage. She denies any acute findings today related to her breathing. Advised patient to track and manage COPD triggers. Review of changes in weather and monitoring for others around her being sick, education on staying safe and protecting self during peak sickness  times. She will be flying to [place] this weekend to visit with a sick relative. She states that she is going to be safe and monitor for changes in her condition.  Provided  verbal instructions on pursed lip breathing and utilized returned demonstration as teach back Advised patient to self assesses COPD action plan zone and make appointment with provider if in the yellow zone for 48 hours without improvement. Review of monitoring for changes and taking medications as directed. Discussed calling the office for changes or new concerns with COPD and effective management of COPD. Advised patient to engage in light exercise as tolerated 3-5 days a week to aid in the the management of COPD Provided education about and advised patient to utilize infection prevention strategies to reduce risk of respiratory infection. Review of risk factors Discussed the importance of adequate rest and management of fatigue with COPD Screening for signs and symptoms of depression related to chronic disease state  Assessed social determinant of health barriers  Symptom Management: Attend all scheduled provider appointments Call provider office for new concerns or questions  call the Suicide and Crisis Lifeline: 988 call the [place] National Suicide Prevention Lifeline: (407) 051-8849 or TTY: 617-734-9408 TTY 508-089-2309) to talk to a trained counselor call 1-800-273-TALK (toll free, 24 hour hotline) if experiencing a Mental Health or [place]  avoid second hand smoke eliminate smoking in my home identify and remove indoor air pollutants limit outdoor activity during cold weather listen for public air quality announcements every day develop a rescue plan eliminate symptom triggers at home follow rescue plan if symptoms flare-up develop a new routine to improve sleep get at least 7 to 8 hours of sleep at night  Follow Up Plan: Telephone follow up appointment with care management team member  scheduled for: 04-27-2023 at 9 am          Plan:Telephone follow up appointment with care management team member scheduled for:  04-27-2023 at 0900 am  [place], MSN, CCM RN Care Manager  Chronic Care Management Direct Number: [phone]

## 2023-02-16 NOTE — Patient Instructions (Signed)
Please call the care guide team at [phone] if you need to cancel or reschedule your appointment.   If you are experiencing a Mental Health or [place] or need someone to talk to, please call the Suicide and Crisis Lifeline: 988 call the [place] National Suicide Prevention Lifeline: 415 333 0342 or TTY: (667)535-4787 TTY (501)480-3790) to talk to a trained counselor call 1-800-273-TALK (toll free, 24 hour hotline)   Following is a copy of the CCM Program Consent:  CCM service includes personalized support from designated clinical staff supervised by the physician, including individualized plan of care and coordination with other care providers 24/7 contact phone numbers for assistance for urgent and routine care needs. Service will only be billed when office clinical staff spend 20 minutes or more in a month to coordinate care. Only one practitioner may furnish and bill the service in a calendar month. The patient may stop CCM services at amy time (effective at the end of the month) by phone call to the office staff. The patient will be responsible for cost sharing (co-pay) or up to 20% of the service fee (after annual deductible is met)  Following is a copy of your full provider care plan:   Goals Addressed             This Visit's Progress    CCM Expected Outcome:  Monitor, Self-Manage and Reduce Symptoms of: Pre-Diabetes       Current Barriers:  Knowledge Deficits related to dietary changes to help with managing pre-DM Chronic Disease Management support and education needs related to effective management of pre-DM with hospitalization for GI bleed in September  Lab Results  Component Value Date   HGBA1C 5.7 (A) 12/24/2021     Planned Interventions: Provided education to patient about basic DM disease process. The patient is doing well. She states that she needs to reschedule her upcoming colonoscopy due to having to go to [place] due to a family illness.  Education and support given. ; Reviewed prescribed diet with patient heart healthy/ADA diet. Review of dietary restrictions. The patient is mindful of what she is eating especially since she has ulcers; Counseled on importance of regular laboratory monitoring as prescribed;        Discussed plans with patient for ongoing care management follow up and provided patient with direct contact information for care management team;      Provided patient with written educational materials related to hypo and hyperglycemia and importance of correct treatment;       Reviewed scheduled/upcoming provider appointments including: 08-18-2023        call provider for findings outside established parameters;       Review of patient status, including review of consultants reports, relevant laboratory and other test results, and medications completed;       Advised patient to discuss changes that would warrant taking medications for DM, questions or concerns related to effective management of DM with provider;      The patient was in the hospital for a GI bleed in September. The patient states her GI symptoms are better.  The patient states her GI symptoms are stable. The patient states that she is going to get her colonoscopy rescheduled. Education and support given.    Symptom Management: Attend all scheduled provider appointments Call provider office for new concerns or questions  call the Suicide and Crisis Lifeline: 988 call the [place] National Suicide Prevention Lifeline: (409)404-2396 or TTY: (775)322-6973 TTY (585)747-7339) to talk to a trained counselor call  1-800-273-TALK (toll free, 24 hour hotline) if experiencing a Mental Health or [place]  check feet daily for cuts, sores or redness trim toenails straight across manage portion size wash and dry feet carefully every day wear comfortable, cotton socks wear comfortable, well-fitting shoes  Follow Up Plan: Telephone follow up appointment  with care management team member scheduled for: 04-27-2023 at 9 am       CCM Expected Outcome:  Monitor, Self-Manage, and Reduce Symptoms of Hypertension       Current Barriers:  Knowledge Deficits related to controlling blood pressures and monitoring for changes in blood pressure Care Coordination needs related to knowing how to effectively manage blood pressures and calling the office for changes in blood pressures and when to take medications and when to hold in a patient with HTN Chronic Disease Management support and education needs related to effective management of HTN  BP Readings from Last 3 Encounters:  02/10/23 126/78  02/07/23 132/82  12/02/22 119/64     Planned Interventions: Evaluation of current treatment plan related to hypertension self management and patient's adherence to plan as established by provider. The patient has stable blood pressures, no acute changes noted;  The patient blood pressures are stable. Denies any new concerns related to HTN Provided education to patient re: stroke prevention, s/s of heart attack and stroke; Reviewed prescribed diet heart healthy diet, avoiding spicy foods due to recent GI Bleed. GI sx and sx have improved and the patient is using Peppermint capsules per directions of the pcp. Reviewed medications with patient and discussed importance of compliance. The patient is compliant with medications. The patient was concerned about her HLD medications and Crestor. The patient heard from her pharmacy that [place] be causing her to have cramping. The patient states that she stopped taking it and called the office. Review of the notes from the pcp and talked at length with the patient about it. She has not taken in 3 days. She is going to start back taking every other day and see how that does and see if she can tell a difference in the cramping of her legs. Education provided. Will let the pcp know what her current plan is. The patient will call for  any new changes or concerns;  Discussed plans with patient for ongoing care management follow up and provided patient with direct contact information for care management team; Advised patient, providing education and rationale, to monitor blood pressure daily and record, calling PCP for findings outside established parameters;  Reviewed scheduled/upcoming provider appointments including: saw pcp on 02-10-2023, next appointment on 08-18-2023. Knows to call for changes or new needs. Advised patient to discuss changes in blood pressures that warrant changes in medications or plan of care with provider; Provided education on prescribed diet heart healthy diet, avoiding spicy foods;  Discussed complications of poorly controlled blood pressure such as heart disease, stroke, circulatory complications, vision complications, kidney impairment, sexual dysfunction;  Screening for signs and symptoms of depression related to chronic disease state;  Assessed social determinant of health barriers;  The patient is wanting to get her preventative health done. The patient received a letter about her mammogram. Answered questions and assisted with scheduling of her mammogram locally.   Symptom Management: Take medications as prescribed   Attend all scheduled provider appointments Call pharmacy for medication refills 3-7 days in advance of running out of medications Call provider office for new concerns or questions  call the Suicide and Crisis Lifeline: 988  call the [place] National Suicide Prevention Lifeline: 217-140-5643 or TTY: 314-591-5990 TTY 848-131-6937) to talk to a trained counselor call 1-800-273-TALK (toll free, 24 hour hotline) if experiencing a Mental Health or [place]   Follow Up Plan: Telephone follow up appointment with care management team member scheduled for: 04-27-2023 at 0900 am       CCM:  Maintain, Monitor and Self-Manage Symptoms of COPD       Current Barriers:  Knowledge  Deficits related to triggers and factors that may cause COPD Exacerbation Chronic Disease Management support and education needs related to COPD  Planned Interventions: Provided patient with basic written and verbal COPD education on self care/management/and exacerbation prevention. The patient is no longer working and states her COPD is stable. Did get some eye drops by visine that are working well for her when she is experiencing allergies related to eyes and drainage. She denies any acute findings today related to her breathing. Advised patient to track and manage COPD triggers. Review of changes in weather and monitoring for others around her being sick, education on staying safe and protecting self during peak sickness times. She will be flying to [place] this weekend to visit with a sick relative. She states that she is going to be safe and monitor for changes in her condition.  Provided  verbal instructions on pursed lip breathing and utilized returned demonstration as teach back Advised patient to self assesses COPD action plan zone and make appointment with provider if in the yellow zone for 48 hours without improvement. Review of monitoring for changes and taking medications as directed. Discussed calling the office for changes or new concerns with COPD and effective management of COPD. Advised patient to engage in light exercise as tolerated 3-5 days a week to aid in the the management of COPD Provided education about and advised patient to utilize infection prevention strategies to reduce risk of respiratory infection. Review of risk factors Discussed the importance of adequate rest and management of fatigue with COPD Screening for signs and symptoms of depression related to chronic disease state  Assessed social determinant of health barriers  Symptom Management: Attend all scheduled provider appointments Call provider office for new concerns or questions  call the Suicide and  Crisis Lifeline: 988 call the [place] National Suicide Prevention Lifeline: (772)061-3796 or TTY: (715)449-1420 TTY (218)396-1869) to talk to a trained counselor call 1-800-273-TALK (toll free, 24 hour hotline) if experiencing a Mental Health or [place]  avoid second hand smoke eliminate smoking in my home identify and remove indoor air pollutants limit outdoor activity during cold weather listen for public air quality announcements every day develop a rescue plan eliminate symptom triggers at home follow rescue plan if symptoms flare-up develop a new routine to improve sleep get at least 7 to 8 hours of sleep at night  Follow Up Plan: Telephone follow up appointment with care management team member scheduled for: 04-27-2023 at 9 am          Patient verbalizes understanding of instructions and care plan provided today and agrees to view in [place]. Active MyChart status and patient understanding of how to access instructions and care plan via MyChart confirmed with patient.     Telephone follow up appointment with care management team member scheduled for: 04-27-2023 at 0900 am

## 2023-02-21 ENCOUNTER — Telehealth: Payer: Self-pay | Admitting: *Deleted

## 2023-02-21 NOTE — Telephone Encounter (Signed)
Patient called over to endo unit because she needed to reschedule. Patient is currently on 03/02/2023.  We are changing this to 03/24/2023.  New instructions will be sent.  Patient verbalized understanding.

## 2023-03-05 DIAGNOSIS — I10 Essential (primary) hypertension: Secondary | ICD-10-CM

## 2023-03-05 DIAGNOSIS — J432 Centrilobular emphysema: Secondary | ICD-10-CM

## 2023-03-17 ENCOUNTER — Encounter: Payer: Self-pay | Admitting: Gastroenterology

## 2023-03-20 ENCOUNTER — Telehealth: Payer: Self-pay | Admitting: Gastroenterology

## 2023-03-20 NOTE — Telephone Encounter (Signed)
Patient calling about her appointment tomorrow 03/20/2023 at 1:30pm. Patient is wondering if Dr. Tobi Bastos wants her to come to that appointment or postpone the appointment for after her colonoscopy. Requesting call back.

## 2023-03-20 NOTE — Telephone Encounter (Signed)
Patient called stating that she wanted to cancel her appointment until after her colonoscopy (03/24/2023). Patient was rescheduled to be seen on 03/28/2023 at 1:45 PM.

## 2023-03-21 ENCOUNTER — Ambulatory Visit: Payer: 59 | Admitting: Gastroenterology

## 2023-03-22 ENCOUNTER — Other Ambulatory Visit: Payer: Self-pay

## 2023-03-23 ENCOUNTER — Encounter: Payer: Self-pay | Admitting: Gastroenterology

## 2023-03-24 ENCOUNTER — Encounter: Payer: Self-pay | Admitting: Gastroenterology

## 2023-03-24 ENCOUNTER — Ambulatory Visit: Payer: 59 | Admitting: Anesthesiology

## 2023-03-24 ENCOUNTER — Encounter
Admission: RE | Disposition: A | Discharge status: Home / Self Care | Payer: Self-pay | Source: Home / Self Care | Attending: Gastroenterology

## 2023-03-24 ENCOUNTER — Other Ambulatory Visit: Payer: Self-pay

## 2023-03-24 ENCOUNTER — Ambulatory Visit
Admission: RE | Admit: 2023-03-24 | Discharge: 2023-03-24 | Disposition: A | Discharge status: Home / Self Care | Payer: 59 | Attending: Gastroenterology | Admitting: Gastroenterology

## 2023-03-24 DIAGNOSIS — M1990 Unspecified osteoarthritis, unspecified site: Secondary | ICD-10-CM | POA: Insufficient documentation

## 2023-03-24 DIAGNOSIS — E669 Obesity, unspecified: Secondary | ICD-10-CM | POA: Insufficient documentation

## 2023-03-24 DIAGNOSIS — Z87891 Personal history of nicotine dependence: Secondary | ICD-10-CM | POA: Diagnosis not present

## 2023-03-24 DIAGNOSIS — R58 Hemorrhage, not elsewhere classified: Secondary | ICD-10-CM | POA: Diagnosis not present

## 2023-03-24 DIAGNOSIS — F32A Depression, unspecified: Secondary | ICD-10-CM | POA: Diagnosis not present

## 2023-03-24 DIAGNOSIS — F419 Anxiety disorder, unspecified: Secondary | ICD-10-CM | POA: Insufficient documentation

## 2023-03-24 DIAGNOSIS — K921 Melena: Secondary | ICD-10-CM | POA: Diagnosis not present

## 2023-03-24 DIAGNOSIS — Z6833 Body mass index (BMI) 33.0-33.9, adult: Secondary | ICD-10-CM | POA: Insufficient documentation

## 2023-03-24 DIAGNOSIS — K640 First degree hemorrhoids: Secondary | ICD-10-CM | POA: Insufficient documentation

## 2023-03-24 DIAGNOSIS — Z8719 Personal history of other diseases of the digestive system: Secondary | ICD-10-CM | POA: Diagnosis not present

## 2023-03-24 DIAGNOSIS — Z8711 Personal history of peptic ulcer disease: Secondary | ICD-10-CM | POA: Diagnosis not present

## 2023-03-24 DIAGNOSIS — D124 Benign neoplasm of descending colon: Secondary | ICD-10-CM | POA: Insufficient documentation

## 2023-03-24 DIAGNOSIS — Z801 Family history of malignant neoplasm of trachea, bronchus and lung: Secondary | ICD-10-CM | POA: Diagnosis not present

## 2023-03-24 DIAGNOSIS — K449 Diaphragmatic hernia without obstruction or gangrene: Secondary | ICD-10-CM | POA: Insufficient documentation

## 2023-03-24 DIAGNOSIS — Z85118 Personal history of other malignant neoplasm of bronchus and lung: Secondary | ICD-10-CM | POA: Insufficient documentation

## 2023-03-24 DIAGNOSIS — R7303 Prediabetes: Secondary | ICD-10-CM | POA: Diagnosis not present

## 2023-03-24 DIAGNOSIS — K635 Polyp of colon: Secondary | ICD-10-CM | POA: Diagnosis not present

## 2023-03-24 DIAGNOSIS — D126 Benign neoplasm of colon, unspecified: Secondary | ICD-10-CM | POA: Diagnosis not present

## 2023-03-24 DIAGNOSIS — I10 Essential (primary) hypertension: Secondary | ICD-10-CM | POA: Insufficient documentation

## 2023-03-24 DIAGNOSIS — K259 Gastric ulcer, unspecified as acute or chronic, without hemorrhage or perforation: Secondary | ICD-10-CM | POA: Diagnosis not present

## 2023-03-24 DIAGNOSIS — D122 Benign neoplasm of ascending colon: Secondary | ICD-10-CM | POA: Diagnosis not present

## 2023-03-24 HISTORY — PX: ESOPHAGOGASTRODUODENOSCOPY (EGD) WITH PROPOFOL: SHX5813

## 2023-03-24 HISTORY — DX: Depression, unspecified: F32.A

## 2023-03-24 HISTORY — DX: Essential (primary) hypertension: I10

## 2023-03-24 HISTORY — PX: COLONOSCOPY WITH PROPOFOL: SHX5780

## 2023-03-24 HISTORY — DX: Gastro-esophageal reflux disease without esophagitis: K21.9

## 2023-03-24 HISTORY — DX: Unspecified osteoarthritis, unspecified site: M19.90

## 2023-03-24 SURGERY — COLONOSCOPY WITH PROPOFOL
Anesthesia: General

## 2023-03-24 MED ORDER — PROPOFOL 10 MG/ML IV BOLUS
INTRAVENOUS | Status: DC | PRN
  Administered 2023-03-24: 90 mg via INTRAVENOUS

## 2023-03-24 MED ORDER — SODIUM CHLORIDE 0.9 % IV SOLN: INTRAVENOUS | Status: DC

## 2023-03-24 MED ORDER — PROPOFOL 500 MG/50ML IV EMUL
INTRAVENOUS | Status: DC | PRN
  Administered 2023-03-24: 150 ug/kg/min via INTRAVENOUS

## 2023-03-24 MED ORDER — LIDOCAINE HCL (CARDIAC) PF 100 MG/5ML IV SOSY
PREFILLED_SYRINGE | INTRAVENOUS | Status: DC | PRN
  Administered 2023-03-24: 50 mg via INTRAVENOUS

## 2023-03-24 NOTE — Anesthesia Postprocedure Evaluation (Signed)
Anesthesia Post Note  Patient: Cheryl Payne  Procedure(s) Performed: COLONOSCOPY WITH PROPOFOL ESOPHAGOGASTRODUODENOSCOPY (EGD) WITH PROPOFOL  Patient location during evaluation: PACU Anesthesia Type: General Level of consciousness: awake and alert, oriented and patient cooperative Pain management: pain level controlled Vital Signs Assessment: post-procedure vital signs reviewed and stable Respiratory status: spontaneous breathing, nonlabored ventilation and respiratory function stable Cardiovascular status: blood pressure returned to baseline and stable Postop Assessment: adequate PO intake Anesthetic complications: no   No notable events documented.   Last Vitals:  Vitals:   03/24/23 1037 03/24/23 1038  BP:  107/68  Pulse:    Resp:    Temp: (!) 35.7 C   SpO2:      Last Pain:  Vitals:   03/24/23 1050  TempSrc:   PainSc: 0-No pain                 Reed Breech

## 2023-03-24 NOTE — H&P (Signed)
Wyline Mood, MD [address], [place], [place], [address] [address], [place], [place], [address] Phone: [phone]  Fax: [phone]  Primary Care Physician:  [name], DO   Pre-Procedure History & Physical: HPI:  Baneza Bartoszek is a 57 y.o. female is here for an endoscopy and colonoscopy    Past Medical History:  Diagnosis Date   Arthritis    Cancer    lung   Chronic left shoulder pain    Depression    GERD (gastroesophageal reflux disease)    Hypertension     Past Surgical History:  Procedure Laterality Date   ESOPHAGOGASTRODUODENOSCOPY (EGD) WITH PROPOFOL N/A 08/17/2022   Procedure: ESOPHAGOGASTRODUODENOSCOPY (EGD) WITH PROPOFOL;  Surgeon: Wyline Mood, MD;  Location: [facility] ENDOSCOPY;  Service: Gastroenterology;  Laterality: N/A;   LUNG SURGERY      Prior to Admission medications   Medication Sig Start Date End Date Taking? Authorizing Provider  amLODipine (NORVASC) 5 MG tablet Take 1 tablet (5 mg total) by mouth daily. (Note this medication has been temporarily HELD, if BP increases in future, please fill and start taking) 08/30/22   Karamalegos, Netta Neat, DO  Ascorbic Acid 125 MG CHEW Chew 1 tablet by mouth.    [provider]  busPIRone (BUSPAR) 5 MG tablet Take 1 tablet (5 mg total) by mouth 3 (three) times daily as needed (anxiety). 02/10/23   Karamalegos, Netta Neat, DO  diclofenac Sodium (VOLTAREN) 1 % GEL Apply 2 g topically.    [provider]  escitalopram (LEXAPRO) 20 MG tablet Take 1 tablet (20 mg total) by mouth at bedtime. 12/02/22   Karamalegos, Netta Neat, DO  gabapentin (NEURONTIN) 300 MG capsule Take 2 capsules (600 mg total) by mouth at bedtime. 12/02/22   Karamalegos, Netta Neat, DO  omeprazole (PRILOSEC) 40 MG capsule Take 1 capsule (40 mg total) by mouth daily before breakfast. 02/10/23   Althea Charon, Netta Neat, DO  rosuvastatin (CRESTOR) 10 MG tablet Take 0.5 tablets (5 mg total) by  mouth every other day. Patient not taking: Reported on 03/17/2023 02/16/23   [name], DO  [facility] syringe 0.5 mLs. 01/19/23   [provider]    Allergies as of 02/08/2023   (No Known Allergies)    Family History  Problem Relation Age of Onset   Stomach cancer Mother 55   Breast cancer Mother    Lung cancer Father 17    Social History   Socioeconomic History   Marital status: Single    Spouse name: Not on file   Number of children: Not on file   Years of education: Not on file   Highest education level: Not on file  Occupational History   Not on file  Tobacco Use   Smoking status: Former    Packs/day: 1.00    Years: 35.00    Additional pack years: 0.00    Total pack years: 35.00    Types: Cigarettes   Smokeless tobacco: Former    Quit date: 06/04/2018  Vaping Use   Vaping Use: Never used  Substance and Sexual Activity   Alcohol use: Never   Drug use: Never   Sexual activity: Not on file  Other Topics Concern   Not on file  Social History Narrative   Not on file   Social Determinants of Health   Financial Resource Strain: Low Risk  (12/08/2022)   Overall Financial Resource Strain (CARDIA)    Difficulty of Paying Living Expenses:  Not hard at all  Food Insecurity: No Food Insecurity (12/08/2022)   Hunger Vital Sign    Worried About Running Out of Food in the Last Year: Never true    Ran Out of Food in the Last Year: Never true  Transportation Needs: No Transportation Needs (12/08/2022)   PRAPARE - Administrator, Civil Service (Medical): No    Lack of Transportation (Non-Medical): No  Physical Activity: Sufficiently Active (12/08/2022)   Exercise Vital Sign    Days of Exercise per Week: 3 days    Minutes of Exercise per Session: 60 min  Stress: No Stress Concern Present (12/08/2022)   [employer] of Occupational Health - Occupational Stress Questionnaire    Feeling of Stress : Not at all  Social Connections: Moderately  Isolated (12/08/2022)   Social Connection and Isolation Panel [NHANES]    Frequency of Communication with Friends and Family: More than three times a week    Frequency of Social Gatherings with Friends and Family: Twice a week    Attends Religious Services: More than 4 times per year    Active Member of [employer] or Organizations: No    Attends Banker Meetings: Never    Marital Status: Never married  Intimate Partner Violence: Not At Risk (12/08/2022)   Humiliation, Afraid, Rape, and Kick questionnaire    Fear of Current or Ex-Partner: No    Emotionally Abused: No    Physically Abused: No    Sexually Abused: No    Review of Systems: See HPI, otherwise negative ROS  Physical Exam: There were no vitals taken for this visit. General:   Alert,  pleasant and cooperative in NAD Head:  Normocephalic and atraumatic. Neck:  Supple; no masses or thyromegaly. Lungs:  Clear throughout to auscultation, normal respiratory effort.    Heart:  +S1, +S2, Regular rate and rhythm, No edema. Abdomen:  Soft, nontender and nondistended. Normal bowel sounds, without guarding, and without rebound.   Neurologic:  Alert and  oriented x4;  grossly normal neurologically.  Impression/Plan: Danny Zimny is here for an endoscopy and colonoscopy  to be performed for  evaluation of gastric ulcers and blood in stool     Risks, benefits, limitations, and alternatives regarding endoscopy have been reviewed with the patient.  Questions have been answered.  All parties agreeable.   Wyline Mood, MD  03/24/2023, 9:16 AM

## 2023-03-24 NOTE — Anesthesia Preprocedure Evaluation (Addendum)
Anesthesia Evaluation  Patient identified by MRN, date of birth, ID band Patient awake    Reviewed: Allergy & Precautions, NPO status , Patient's Chart, lab work & pertinent test results  History of Anesthesia Complications Negative for: history of anesthetic complications  Airway Mallampati: I   Neck ROM: Full    Dental  (+) Missing   Pulmonary former smoker (quit 2019) Lung CA   Pulmonary exam normal breath sounds clear to auscultation       Cardiovascular hypertension, Normal cardiovascular exam Rhythm:Regular Rate:Normal  ECG 08/16/22: Sinus rhythm Borderline T wave abnormalities   Neuro/Psych  PSYCHIATRIC DISORDERS Anxiety Depression    negative neurological ROS     GI/Hepatic PUD,,,  Endo/Other  Obesity, prediabetes  Renal/GU negative Renal ROS     Musculoskeletal  (+) Arthritis ,    Abdominal   Peds  Hematology  (+) Blood dyscrasia, anemia   Anesthesia Other Findings   Reproductive/Obstetrics                             Anesthesia Physical Anesthesia Plan  ASA: 2  Anesthesia Plan: General   Post-op Pain Management:    Induction: Intravenous  PONV Risk Score and Plan: 3 and Propofol infusion, TIVA and Treatment may vary due to age or medical condition  Airway Management Planned: Natural Airway  Additional Equipment:   Intra-op Plan:   Post-operative Plan:   Informed Consent: I have reviewed the patients History and Physical, chart, labs and discussed the procedure including the risks, benefits and alternatives for the proposed anesthesia with the patient or authorized representative who has indicated his/her understanding and acceptance.       Plan Discussed with: CRNA  Anesthesia Plan Comments: (LMA/GETA backup discussed.  Patient consented for risks of anesthesia including but not limited to:  - adverse reactions to medications - damage to eyes, teeth, lips  or other oral mucosa - nerve damage due to positioning  - sore throat or hoarseness - damage to heart, brain, nerves, lungs, other parts of body or loss of life  Informed patient about role of CRNA in peri- and intra-operative care.  Patient voiced understanding.)        Anesthesia Quick Evaluation

## 2023-03-24 NOTE — Op Note (Signed)
[facility] Gastroenterology Patient Name: Cheryl Payne Procedure Date: 03/24/2023 9:48 AM MRN: [id number] Account #: [id number] Date of Birth: 03-16-1966 Admit Type: Outpatient Age: 57 Room: [facility] ENDO ROOM 4 Gender: Female Note Status: Finalized Instrument Name: Prentice Docker [id number] Procedure:             Colonoscopy Indications:           Hematochezia Providers:             Wyline Mood MD, MD Referring MD:          [name] (Referring MD) Medicines:             Monitored Anesthesia Care Complications:         No immediate complications. Procedure:             Pre-Anesthesia Assessment:                        - Prior to the procedure, a History and Physical was                         performed, and patient medications, allergies and                         sensitivities were reviewed. The patient's tolerance                         of previous anesthesia was reviewed.                        - The risks and benefits of the procedure and the                         sedation options and risks were discussed with the                         patient. All questions were answered and informed                         consent was obtained.                        - ASA Grade Assessment: II - A patient with mild                         systemic disease.                        After obtaining informed consent, the colonoscope was                         passed under direct vision. Throughout the procedure,                         the patient's blood pressure, pulse, and oxygen                         saturations were monitored continuously. The                         Colonoscope was introduced through the anus and  advanced to the the cecum, identified by the                         appendiceal orifice. The colonoscopy was performed                         with ease. The patient tolerated the procedure well.                          The quality of the bowel preparation was excellent.                         The ileocecal valve, appendiceal orifice, and rectum                         were photographed. Findings:      The perianal and digital rectal examinations were normal.      Non-bleeding internal hemorrhoids were found during retroflexion. The       hemorrhoids were medium-sized and Grade I (internal hemorrhoids that do       not prolapse).      A 12 mm polyp was found in the descending colon. The polyp was       pedunculated. The polyp was removed with a hot snare. Resection and       retrieval were complete.      The exam was otherwise without abnormality on direct and retroflexion       views. Impression:            - Non-bleeding internal hemorrhoids.                        - One 12 mm polyp in the descending colon, removed                         with a hot snare. Resected and retrieved.                        - The examination was otherwise normal on direct and                         retroflexion views. Recommendation:        - Discharge patient to home (with escort).                        - Resume previous diet.                        - Continue present medications.                        - Await pathology results.                        - Repeat colonoscopy in 3 years for surveillance based                         on pathology results. Procedure Code(s):     --- Professional ---  13086, Colonoscopy, flexible; with removal of                         tumor(s), polyp(s), or other lesion(s) by snare                         technique Diagnosis Code(s):     --- Professional ---                        D12.4, Benign neoplasm of descending colon                        K64.0, First degree hemorrhoids CPT copyright 2022 American Medical Association. All rights reserved. The codes documented in this report are preliminary and upon coder review may  be revised to meet current compliance  requirements. Wyline Mood, MD Wyline Mood MD, MD 03/24/2023 10:31:13 AM This report has been signed electronically. Number of Addenda: 0 Note Initiated On: 03/24/2023 9:48 AM Scope Withdrawal Time: 0 hours 11 minutes 14 seconds  Total Procedure Duration: 0 hours 15 minutes 39 seconds  Estimated Blood Loss:  Estimated blood loss: none.      [facility]

## 2023-03-24 NOTE — Transfer of Care (Signed)
Immediate Anesthesia Transfer of Care Note  Patient: Cheryl Payne  Procedure(s) Performed: COLONOSCOPY WITH PROPOFOL ESOPHAGOGASTRODUODENOSCOPY (EGD) WITH PROPOFOL  Patient Location: Endoscopy Unit  Anesthesia Type:General  Level of Consciousness: awake and patient cooperative  Airway & Oxygen Therapy: Patient Spontanous Breathing  Post-op Assessment: Report given to RN and Post -op Vital signs reviewed and stable  Post vital signs: Reviewed and stable  Last Vitals:  Vitals Value Taken Time  BP 100/76 03/24/23 1033  Temp    Pulse 69 03/24/23 1033  Resp 21 03/24/23 1033  SpO2 94 % 03/24/23 1033  Vitals shown include unvalidated device data.  Last Pain:  Vitals:   03/24/23 1031  TempSrc:   PainSc: 0-No pain         Complications: No notable events documented.

## 2023-03-24 NOTE — Op Note (Signed)
[facility] Gastroenterology Patient Name: Cheryl Payne Procedure Date: 03/24/2023 9:49 AM MRN: [id number] Account #: [id number] Date of Birth: [birth year] Admit Type: Outpatient Age: 57 Room: 4 Gender: Female Note Status: Finalized Instrument Name: Upper Endoscope [id number] Procedure:             Upper GI endoscopy Indications:           Follow-up of acute peptic ulcer Providers:             Wyline Mood MD, MD Referring MD:          [name] (Referring MD) Medicines:             Monitored Anesthesia Care Complications:         No immediate complications. Procedure:             Pre-Anesthesia Assessment:                        - Prior to the procedure, a History and Physical was                         performed, and patient medications, allergies and                         sensitivities were reviewed. The patient's tolerance                         of previous anesthesia was reviewed.                        - The risks and benefits of the procedure and the                         sedation options and risks were discussed with the                         patient. All questions were answered and informed                         consent was obtained.                        - ASA Grade Assessment: II - A patient with mild                         systemic disease.                        After obtaining informed consent, the endoscope was                         passed under direct vision. Throughout the procedure,                         the patient's blood pressure, pulse, and oxygen                         saturations were monitored continuously. The                         Endosonoscope was introduced  through the mouth, and                         advanced to the third part of duodenum. The upper GI                         endoscopy was accomplished with ease. The patient                         tolerated the procedure well. Findings:      The  esophagus was normal.      The examined duodenum was normal.      A small hiatal hernia was present.      The cardia and gastric fundus were normal on retroflexion. Impression:            - Normal esophagus.                        - Normal examined duodenum.                        - Small hiatal hernia.                        - No specimens collected. Recommendation:        - Perform a colonoscopy today. Procedure Code(s):     --- Professional ---                        [phone], Esophagogastroduodenoscopy, flexible,                         transoral; diagnostic, including collection of                         specimen(s) by brushing or washing, when performed                         (separate procedure) Diagnosis Code(s):     --- Professional ---                        K44.9, Diaphragmatic hernia without obstruction or                         gangrene                        K27.3, Acute peptic ulcer, site unspecified, without                         hemorrhage or perforation CPT copyright 2022 American Medical Association. All rights reserved. The codes documented in this report are preliminary and upon coder review may  be revised to meet current compliance requirements. Wyline Mood, MD Wyline Mood MD, MD 03/24/2023 10:10:14 AM This report has been signed electronically. Number of Addenda: 0 Note Initiated On: 03/24/2023 9:49 AM Estimated Blood Loss:  Estimated blood loss: none.      [facility]

## 2023-03-27 ENCOUNTER — Ambulatory Visit (INDEPENDENT_AMBULATORY_CARE_PROVIDER_SITE_OTHER): Payer: 59 | Admitting: Gastroenterology

## 2023-03-27 ENCOUNTER — Encounter: Payer: Self-pay | Admitting: Gastroenterology

## 2023-03-27 VITALS — BP 130/81 | HR 76 | Temp 98.6°F | Ht 64.0 in | Wt 199.6 lb

## 2023-03-27 DIAGNOSIS — K648 Other hemorrhoids: Secondary | ICD-10-CM

## 2023-03-27 LAB — SURGICAL PATHOLOGY

## 2023-03-27 NOTE — Progress Notes (Signed)
Wyline Mood MD, MRCP(U.K) [address]  [address]  [place], [place] [address]  Main: [phone]  Fax: [phone]   Primary Care Physician: [name], DO  Primary Gastroenterologist:  Dr. Wyline Mood   Chief Complaint  Patient presents with   hemorrhoid banding    HPI: Cheryl Payne is a 57 y.o. female  Summary of history :   I had seen her back in 08/24/2022 when she presented to the hospital with hematemesis after nausea and vomiting.  On admission her hemoglobin was 9.3 g with a BUN of 69.  She had been on meloxicam.  I put formed an upper endoscopy on 08/17/2022 that showed erythematous appearing mucosa in the duodenal bulb and a linear gastric ulcers with clean base in the greater curvature the stomach largest was 10 mm in size biopsies were taken 15 mm mucosal nodule was seen at the GE junction biopsies were taken.  Plan was to repeat EGD in 8 weeks to check for healing of the ulcers.  Biopsies of the duodenum showed peptic duodenitis and the biopsies of stomach showed reactive gastropathy GE junction nodule features of reflux esophagitis was seen no evidence of malignancy was seen. 01/17/2023 hemoglobin 13.6 g CMP shows no abnormality.    Interval history   02/07/2023-03/27/2023  02/07/2023: H. pylori breath test negative  03/24/2023: EGD: Small hiatal hernia no ulcers noted Colonoscopy: 12 mm polyp in the descending colon pedunculated resected nonbleeding internal hemorrhoids seen  Polyp path report still pending.  Still has perianal discomfort on and off bleeding associated with a bowel movement would like to get hemorrhoidal banding done today   PROCEDURE NOTE: The patient presents with symptomatic grade 1 hemorrhoids, unresponsive to maximal medical therapy, requesting rubber band ligation of his/her hemorrhoidal disease.  All risks, benefits and alternative forms of therapy were described and informed consent was obtained.  In the Left Lateral  Decubitus position (if anoscopy is performed) anoscopic examination revealed grade 1 hemorrhoids in the all position(s).   The decision was made to band the LL internal hemorrhoid, and the [facility] System was used to perform band ligation without complication.  Digital anorectal examination was then performed to assure proper positioning of the band, and to adjust the banded tissue as required.  The patient was discharged home without pain or other issues.  Dietary and behavioral recommendations were given and (if necessary - prescriptions were given), along with follow-up instructions.  The patient will return 4 weeks for follow-up and possible additional banding as required.  No complications were encountered and the patient tolerated the procedure well.      His hospital discharge denies any black stools.  Doing well.  Complains some blood on the tissue paper when she has a bowel movement she has tried high-fiber diet has not worked never had a colonoscopy.  Denies any constipation.  Not on any PPI presently. Current Outpatient Medications  Medication Sig Dispense Refill   amLODipine (NORVASC) 5 MG tablet Take 1 tablet (5 mg total) by mouth daily. (Note this medication has been temporarily HELD, if BP increases in future, please fill and start taking) 90 tablet 3   busPIRone (BUSPAR) 5 MG tablet Take 1 tablet (5 mg total) by mouth 3 (three) times daily as needed (anxiety). 90 tablet 2   escitalopram (LEXAPRO) 20 MG tablet Take 1 tablet (20 mg total) by mouth at bedtime. 90 tablet 3   gabapentin (NEURONTIN) 300 MG capsule Take 2 capsules (600 mg  total) by mouth at bedtime. 180 capsule 3   omeprazole (PRILOSEC) 40 MG capsule Take 1 capsule (40 mg total) by mouth daily before breakfast. 90 capsule 1   rosuvastatin (CRESTOR) 10 MG tablet Take 5 mg by mouth every other day.     SPIKEVAX syringe 0.5 mLs.     Ascorbic Acid 125 MG CHEW Chew 1 tablet by mouth.     diclofenac Sodium (VOLTAREN) 1 %  GEL Apply 2 g topically.     No current facility-administered medications for this visit.    Allergies as of 03/27/2023   (No Known Allergies)    ROS:  General: Negative for anorexia, weight loss, fever, chills, fatigue, weakness. ENT: Negative for hoarseness, difficulty swallowing , nasal congestion. CV: Negative for chest pain, angina, palpitations, dyspnea on exertion, peripheral edema.  Respiratory: Negative for dyspnea at rest, dyspnea on exertion, cough, sputum, wheezing.  GI: See history of present illness. GU:  Negative for dysuria, hematuria, urinary incontinence, urinary frequency, nocturnal urination.  Endo: Negative for unusual weight change.    Physical Examination:   BP 130/81   Pulse 76   Temp 98.6 F (37 C)   Ht  (1.626 m)   Wt 199 lb 9.6 oz (90.5 kg)   BMI 34.26 kg/m   General: Well-nourished, well-developed in no acute distress.  Eyes: No icterus. Conjunctivae pink. Skin: Warm and dry, no jaundice.   Psych: Alert and cooperative, normal mood and affect.   Imaging Studies: No results found.  Assessment and Plan:   Cheryl Payne is a 57 y.o. y/o female here to follow-up for blood on tissue paper.  In 08/24/2022 admitted with an upper GI bleed secondary to multiple NSAID related gastric ulcers.  Plan 1.  Status post banding of left lateral column of her internal hemorrhoids    Dr Wyline Mood  MD,MRCP [facility]) Follow up in 4 weeks to complete banding over the next 2 sessions

## 2023-03-27 NOTE — Patient Instructions (Signed)
Please see your follow up appointment listed below. Please call office if you experience any pain.

## 2023-04-27 ENCOUNTER — Telehealth: Payer: 59

## 2023-04-27 ENCOUNTER — Ambulatory Visit (INDEPENDENT_AMBULATORY_CARE_PROVIDER_SITE_OTHER): Payer: 59

## 2023-04-27 DIAGNOSIS — I10 Essential (primary) hypertension: Secondary | ICD-10-CM

## 2023-04-27 DIAGNOSIS — J432 Centrilobular emphysema: Secondary | ICD-10-CM

## 2023-04-27 DIAGNOSIS — R7303 Prediabetes: Secondary | ICD-10-CM

## 2023-04-27 NOTE — Patient Instructions (Signed)
Please call the care guide team at [phone] if you need to cancel or reschedule your appointment.   If you are experiencing a Mental Health or Behavioral Health Crisis or need someone to talk to, please call the Suicide and Crisis Lifeline: 988 call the [place] National Suicide Prevention Lifeline: (740) 350-3576 or TTY: (769) 723-1022 TTY 3373242518) to talk to a trained counselor call 1-800-273-TALK (toll free, 24 hour hotline)   Following is a copy of the CCM Program Consent:  CCM service includes personalized support from designated clinical staff supervised by the physician, including individualized plan of care and coordination with other care providers 24/7 contact phone numbers for assistance for urgent and routine care needs. Service will only be billed when office clinical staff spend 20 minutes or more in a month to coordinate care. Only one practitioner may furnish and bill the service in a calendar month. The patient may stop CCM services at amy time (effective at the end of the month) by phone call to the office staff. The patient will be responsible for cost sharing (co-pay) or up to 20% of the service fee (after annual deductible is met)  Following is a copy of your full provider care plan:   Goals Addressed             This Visit's Progress    CCM Expected Outcome:  Monitor, Self-Manage and Reduce Symptoms of: Pre-Diabetes       Current Barriers:  Knowledge Deficits related to dietary changes to help with managing pre-DM Chronic Disease Management support and education needs related to effective management of pre-DM with hospitalization for GI bleed in September  Lab Results  Component Value Date   HGBA1C 5.7 (A) 12/24/2021     Planned Interventions: Provided education to patient about basic DM disease process. The patient denies any issues with her DM. Knows that she needs to have new blood work.  Reviewed prescribed diet with patient heart healthy/ADA diet.  Review of dietary restrictions. The patient is mindful of what she is eating especially since she has ulcers.; Counseled on importance of regular laboratory monitoring as prescribed. Review of last A1C in 12-2021 last year. The patient knows she needs to get updated labs. Denies any new issues        Discussed plans with patient for ongoing care management follow up and provided patient with direct contact information for care management team;      Provided patient with written educational materials related to hypo and hyperglycemia and importance of correct treatment. Review of the signs and symptoms of hypo and hyperglycemia.        Reviewed scheduled/upcoming provider appointments including: 08-18-2023 at 0820 am        call provider for findings outside established parameters;       Review of patient status, including review of consultants reports, relevant laboratory and other test results, and medications completed;       Advised patient to discuss changes that would warrant taking medications for DM, questions or concerns related to effective management of DM with provider;      The patient was in the hospital for a GI bleed in September. The patient states her GI symptoms are better.  The patient states her GI symptoms are stable. The patient had hemorrhoid banding in April. She is doing well. Denies any acute changes.    Symptom Management: Attend all scheduled provider appointments Call provider office for new concerns or questions  call the Suicide and Crisis Lifeline: 988  call the [place] National Suicide Prevention Lifeline: 615 685 8233 or TTY: 630-854-7380 TTY [phone]) to talk to a trained counselor call 1-800-273-TALK (toll free, 24 hour hotline) if experiencing a Mental Health or Behavioral Health Crisis  check feet daily for cuts, sores or redness trim toenails straight across manage portion size wash and dry feet carefully every day wear comfortable, cotton socks wear  comfortable, well-fitting shoes  Follow Up Plan: Telephone follow up appointment with care management team member scheduled for: 06-27-2023 at 9 am       CCM Expected Outcome:  Monitor, Self-Manage, and Reduce Symptoms of Hypertension       Current Barriers:  Knowledge Deficits related to controlling blood pressures and monitoring for changes in blood pressure Care Coordination needs related to knowing how to effectively manage blood pressures and calling the office for changes in blood pressures and when to take medications and when to hold in a patient with HTN Chronic Disease Management support and education needs related to effective management of HTN  BP Readings from Last 3 Encounters:  03/27/23 130/81  03/24/23 107/68  02/10/23 126/78     Planned Interventions: Evaluation of current treatment plan related to hypertension self management and patient's adherence to plan as established by provider. The patient has stable blood pressures, no acute changes noted;  The patient blood pressures are stable. Denies any new concerns related to HTN Provided education to patient re: stroke prevention, s/s of heart attack and stroke; Reviewed prescribed diet heart healthy diet, avoiding spicy foods due to recent GI Bleed. GI sx and sx have improved and the patient is using Peppermint capsules per directions of the pcp. Reviewed medications with patient and discussed importance of compliance. The patient is compliant with medications.  Discussed plans with patient for ongoing care management follow up and provided patient with direct contact information for care management team; Advised patient, providing education and rationale, to monitor blood pressure daily and record, calling PCP for findings outside established parameters;  Reviewed scheduled/upcoming provider appointments including:  next appointment on 08-18-2023. Knows to call for changes or new needs. Advised patient to discuss changes in  blood pressures that warrant changes in medications or plan of care with provider; Provided education on prescribed diet heart healthy diet, avoiding spicy foods;  Discussed complications of poorly controlled blood pressure such as heart disease, stroke, circulatory complications, vision complications, kidney impairment, sexual dysfunction;  Screening for signs and symptoms of depression related to chronic disease state;  Assessed social determinant of health barriers;  The patient is wanting to get her preventative health done. The patient received a letter about her mammogram. Answered questions and assisted with scheduling of her mammogram locally.   Symptom Management: Take medications as prescribed   Attend all scheduled provider appointments Call pharmacy for medication refills 3-7 days in advance of running out of medications Call provider office for new concerns or questions  call the Suicide and Crisis Lifeline: 988 call the [place] National Suicide Prevention Lifeline: 929-740-9150 or TTY: 831 562 0587 TTY (657)[phone]) to talk to a trained counselor call 1-800-273-TALK (toll free, 24 hour hotline) if experiencing a Mental Health or Behavioral Health Crisis   Follow Up Plan: Telephone follow up appointment with care management team member scheduled for: 06-27-2023 at 0900 am       CCM:  Maintain, Monitor and Self-Manage Symptoms of COPD       Current Barriers:  Knowledge Deficits related to triggers and factors that may cause COPD Exacerbation Chronic Disease Management  support and education needs related to COPD  Planned Interventions: Provided patient with basic written and verbal COPD education on self care/management/and exacerbation prevention. The patient is no longer working and states her COPD is stable. Did get some eye drops by visine that are working well for her when she is experiencing allergies related to eyes and drainage. She denies any acute findings today related  to her breathing. Advised patient to track and manage COPD triggers. Review of changes in weather and monitoring for others around her being sick, education on staying safe and protecting self during peak sickness times. Is monitoring for changes in her breathing. Denies any acute changes in her COPD. Stays out of the heat.   Provided  verbal instructions on pursed lip breathing and utilized returned demonstration as teach back Advised patient to self assesses COPD action plan zone and make appointment with provider if in the yellow zone for 48 hours without improvement. Review of monitoring for changes and taking medications as directed. Discussed calling the office for changes or new concerns with COPD and effective management of COPD. Advised patient to engage in light exercise as tolerated 3-5 days a week to aid in the the management of COPD Provided education about and advised patient to utilize infection prevention strategies to reduce risk of respiratory infection. Review of risk factors Discussed the importance of adequate rest and management of fatigue with COPD Screening for signs and symptoms of depression related to chronic disease state  Assessed social determinant of health barriers  Symptom Management: Attend all scheduled provider appointments Call provider office for new concerns or questions  call the Suicide and Crisis Lifeline: 988 call the [place] National Suicide Prevention Lifeline: 838-701-8256 or TTY: 6815906952 TTY [phone]) to talk to a trained counselor call 1-800-273-TALK (toll free, 24 hour hotline) if experiencing a Mental Health or Behavioral Health Crisis  avoid second hand smoke eliminate smoking in my home identify and remove indoor air pollutants limit outdoor activity during cold weather listen for public air quality announcements every day develop a rescue plan eliminate symptom triggers at home follow rescue plan if symptoms flare-up develop a new  routine to improve sleep get at least 7 to 8 hours of sleep at night  Follow Up Plan: Telephone follow up appointment with care management team member scheduled for: 06-27-2023 at 9 am          Patient verbalizes understanding of instructions and care plan provided today and agrees to view in MyChart. Active MyChart status and patient understanding of how to access instructions and care plan via MyChart confirmed with patient.  Telephone follow up appointment with care management team member scheduled for: 06-27-2023 at 0900 am

## 2023-04-27 NOTE — Chronic Care Management (AMB) (Signed)
Chronic Care Management   CCM RN Visit Note  04/27/2023 Name: Cheryl Payne MRN: [id number] DOB: 05/17/1966  Subjective: Cheryl Payne is a 57 y.o. year old female who is a primary care patient of [name], DO. The patient was referred to the Chronic Care Management team for assistance with care management needs subsequent to provider initiation of CCM services and plan of care.    Today's Visit:  Engaged with patient by telephone for follow up visit.        Goals Addressed             This Visit's Progress    CCM Expected Outcome:  Monitor, Self-Manage and Reduce Symptoms of: Pre-Diabetes       Current Barriers:  Knowledge Deficits related to dietary changes to help with managing pre-DM Chronic Disease Management support and education needs related to effective management of pre-DM with hospitalization for GI bleed in September  Lab Results  Component Value Date   HGBA1C 5.7 (A) 12/24/2021     Planned Interventions: Provided education to patient about basic DM disease process. The patient denies any issues with her DM. Knows that she needs to have new blood work.  Reviewed prescribed diet with patient heart healthy/ADA diet. Review of dietary restrictions. The patient is mindful of what she is eating especially since she has ulcers.; Counseled on importance of regular laboratory monitoring as prescribed. Review of last A1C in 12-2021 last year. The patient knows she needs to get updated labs. Denies any new issues        Discussed plans with patient for ongoing care management follow up and provided patient with direct contact information for care management team;      Provided patient with written educational materials related to hypo and hyperglycemia and importance of correct treatment. Review of the signs and symptoms of hypo and hyperglycemia.        Reviewed scheduled/upcoming provider appointments including: 08-18-2023 at 0820 am        call provider for  findings outside established parameters;       Review of patient status, including review of consultants reports, relevant laboratory and other test results, and medications completed;       Advised patient to discuss changes that would warrant taking medications for DM, questions or concerns related to effective management of DM with provider;      The patient was in the hospital for a GI bleed in September. The patient states her GI symptoms are better.  The patient states her GI symptoms are stable. The patient had hemorrhoid banding in April. She is doing well. Denies any acute changes.    Symptom Management: Attend all scheduled provider appointments Call provider office for new concerns or questions  call the Suicide and Crisis Lifeline: 988 call the [place] National Suicide Prevention Lifeline: 986 258 5649 or TTY: 863-380-1316 TTY [phone]) to talk to a trained counselor call 1-800-273-TALK (toll free, 24 hour hotline) if experiencing a Mental Health or Behavioral Health Crisis  check feet daily for cuts, sores or redness trim toenails straight across manage portion size wash and dry feet carefully every day wear comfortable, cotton socks wear comfortable, well-fitting shoes  Follow Up Plan: Telephone follow up appointment with care management team member scheduled for: 06-27-2023 at 9 am       CCM Expected Outcome:  Monitor, Self-Manage, and Reduce Symptoms of Hypertension       Current Barriers:  Knowledge Deficits related to controlling blood pressures and monitoring  for changes in blood pressure Care Coordination needs related to knowing how to effectively manage blood pressures and calling the office for changes in blood pressures and when to take medications and when to hold in a patient with HTN Chronic Disease Management support and education needs related to effective management of HTN  BP Readings from Last 3 Encounters:  03/27/23 130/81  03/24/23 107/68  02/10/23  126/78     Planned Interventions: Evaluation of current treatment plan related to hypertension self management and patient's adherence to plan as established by provider. The patient has stable blood pressures, no acute changes noted;  The patient blood pressures are stable. Denies any new concerns related to HTN Provided education to patient re: stroke prevention, s/s of heart attack and stroke; Reviewed prescribed diet heart healthy diet, avoiding spicy foods due to recent GI Bleed. GI sx and sx have improved and the patient is using Peppermint capsules per directions of the pcp. Reviewed medications with patient and discussed importance of compliance. The patient is compliant with medications.  Discussed plans with patient for ongoing care management follow up and provided patient with direct contact information for care management team; Advised patient, providing education and rationale, to monitor blood pressure daily and record, calling PCP for findings outside established parameters;  Reviewed scheduled/upcoming provider appointments including:  next appointment on 08-18-2023. Knows to call for changes or new needs. Advised patient to discuss changes in blood pressures that warrant changes in medications or plan of care with provider; Provided education on prescribed diet heart healthy diet, avoiding spicy foods;  Discussed complications of poorly controlled blood pressure such as heart disease, stroke, circulatory complications, vision complications, kidney impairment, sexual dysfunction;  Screening for signs and symptoms of depression related to chronic disease state;  Assessed social determinant of health barriers;  The patient is wanting to get her preventative health done. The patient received a letter about her mammogram. Answered questions and assisted with scheduling of her mammogram locally.   Symptom Management: Take medications as prescribed   Attend all scheduled provider  appointments Call pharmacy for medication refills 3-7 days in advance of running out of medications Call provider office for new concerns or questions  call the Suicide and Crisis Lifeline: 988 call the [place] National Suicide Prevention Lifeline: (519)733-4510 or TTY: 747-789-7553 TTY 5037026992) to talk to a trained counselor call 1-800-273-TALK (toll free, 24 hour hotline) if experiencing a Mental Health or Behavioral Health Crisis   Follow Up Plan: Telephone follow up appointment with care management team member scheduled for: 06-27-2023 at 0900 am       CCM:  Maintain, Monitor and Self-Manage Symptoms of COPD       Current Barriers:  Knowledge Deficits related to triggers and factors that may cause COPD Exacerbation Chronic Disease Management support and education needs related to COPD  Planned Interventions: Provided patient with basic written and verbal COPD education on self care/management/and exacerbation prevention. The patient is no longer working and states her COPD is stable. Did get some eye drops by visine that are working well for her when she is experiencing allergies related to eyes and drainage. She denies any acute findings today related to her breathing. Advised patient to track and manage COPD triggers. Review of changes in weather and monitoring for others around her being sick, education on staying safe and protecting self during peak sickness times. Is monitoring for changes in her breathing. Denies any acute changes in her COPD. Stays out of the heat.  Provided  verbal instructions on pursed lip breathing and utilized returned demonstration as teach back Advised patient to self assesses COPD action plan zone and make appointment with provider if in the yellow zone for 48 hours without improvement. Review of monitoring for changes and taking medications as directed. Discussed calling the office for changes or new concerns with COPD and effective management of  COPD. Advised patient to engage in light exercise as tolerated 3-5 days a week to aid in the the management of COPD Provided education about and advised patient to utilize infection prevention strategies to reduce risk of respiratory infection. Review of risk factors Discussed the importance of adequate rest and management of fatigue with COPD Screening for signs and symptoms of depression related to chronic disease state  Assessed social determinant of health barriers  Symptom Management: Attend all scheduled provider appointments Call provider office for new concerns or questions  call the Suicide and Crisis Lifeline: 988 call the [place] National Suicide Prevention Lifeline: 2724180137 or TTY: 203-216-7612 TTY 407-048-7906) to talk to a trained counselor call 1-800-273-TALK (toll free, 24 hour hotline) if experiencing a Mental Health or Behavioral Health Crisis  avoid second hand smoke eliminate smoking in my home identify and remove indoor air pollutants limit outdoor activity during cold weather listen for public air quality announcements every day develop a rescue plan eliminate symptom triggers at home follow rescue plan if symptoms flare-up develop a new routine to improve sleep get at least 7 to 8 hours of sleep at night  Follow Up Plan: Telephone follow up appointment with care management team member scheduled for: 06-27-2023 at 9 am          Plan:Telephone follow up appointment with care management team member scheduled for:  06-27-2023 at 0900 am  Alto Denver RN, MSN, CCM RN Care Manager  Chronic Care Management Direct Number: [phone]

## 2023-05-03 ENCOUNTER — Other Ambulatory Visit: Payer: Self-pay

## 2023-05-04 ENCOUNTER — Ambulatory Visit (INDEPENDENT_AMBULATORY_CARE_PROVIDER_SITE_OTHER): Payer: 59 | Admitting: Gastroenterology

## 2023-05-04 ENCOUNTER — Encounter: Payer: Self-pay | Admitting: Gastroenterology

## 2023-05-04 VITALS — BP 117/76 | HR 80 | Temp 97.6°F | Wt 200.0 lb

## 2023-05-04 DIAGNOSIS — K648 Other hemorrhoids: Secondary | ICD-10-CM | POA: Diagnosis not present

## 2023-05-04 NOTE — Progress Notes (Signed)
Patient follow-ups today for banding of hemorrhoids    Summary of history :  Has perianal discomfort on and off bleeding associated with a bowel movement would like to get hemorrhoidal banding done today     First round:03/27/2023 Did well after the last round of hemorrhoidal banding no complaints     Digital rectal exam performed in the presence of a chaperone. External anal findings: Normal Internal findings: Normal, No masses, no blood on glove noticed.    PROCEDURE NOTE: The patient presents with symptomatic grade 1 hemorrhoids, unresponsive to maximal medical therapy, requesting rubber band ligation of his/her hemorrhoidal disease.  All risks, benefits and alternative forms of therapy were described and informed consent was obtained.  In the Left Lateral Decubitus position (if anoscopy is performed) anoscopic examination revealed grade 1 hemorrhoids in the RA and RP position(s).   The decision was made to band the RA internal hemorrhoid, and the [facility]an System was used to perform band ligation without complication.  Digital anorectal examination was then performed to assure proper positioning of the band, and to adjust the banded tissue as required.  The patient was discharged home without pain or other issues.  Dietary and behavioral recommendations were given and (if necessary - prescriptions were given), along with follow-up instructions.  The patient will return 4 weeks for follow-up and possible additional banding as required.  No complications were encountered and the patient tolerated the procedure well.   Plan:  Avoid constipation.  Commence on stool softeners if not already on  Follow-up: 4 weeks  Dr Wyline Mood MD,MRCP [facility]) Gastroenterology/Hepatology Pager: [phone]

## 2023-05-05 DIAGNOSIS — I10 Essential (primary) hypertension: Secondary | ICD-10-CM

## 2023-05-05 DIAGNOSIS — J432 Centrilobular emphysema: Secondary | ICD-10-CM

## 2023-05-11 ENCOUNTER — Other Ambulatory Visit: Payer: Self-pay | Admitting: Family Medicine

## 2023-05-11 ENCOUNTER — Telehealth: Payer: Self-pay

## 2023-05-11 ENCOUNTER — Ambulatory Visit (INDEPENDENT_AMBULATORY_CARE_PROVIDER_SITE_OTHER): Payer: 59

## 2023-05-11 DIAGNOSIS — F3341 Major depressive disorder, recurrent, in partial remission: Secondary | ICD-10-CM

## 2023-05-11 DIAGNOSIS — K279 Peptic ulcer, site unspecified, unspecified as acute or chronic, without hemorrhage or perforation: Secondary | ICD-10-CM

## 2023-05-11 DIAGNOSIS — I10 Essential (primary) hypertension: Secondary | ICD-10-CM

## 2023-05-11 DIAGNOSIS — G8929 Other chronic pain: Secondary | ICD-10-CM

## 2023-05-11 DIAGNOSIS — E7800 Pure hypercholesterolemia, unspecified: Secondary | ICD-10-CM

## 2023-05-11 DIAGNOSIS — R7303 Prediabetes: Secondary | ICD-10-CM

## 2023-05-11 DIAGNOSIS — F411 Generalized anxiety disorder: Secondary | ICD-10-CM

## 2023-05-11 DIAGNOSIS — J432 Centrilobular emphysema: Secondary | ICD-10-CM

## 2023-05-11 MED ORDER — ROSUVASTATIN CALCIUM 10 MG PO TABS: 5.0000 mg | ORAL_TABLET | ORAL | 1 refills | Status: DC

## 2023-05-11 MED ORDER — OMEPRAZOLE 40 MG PO CPDR: 40.0000 mg | DELAYED_RELEASE_CAPSULE | Freq: Every day | ORAL | 1 refills | Status: DC

## 2023-05-11 MED ORDER — GABAPENTIN 300 MG PO CAPS: 600.0000 mg | ORAL_CAPSULE | Freq: Every day | ORAL | 1 refills | Status: DC

## 2023-05-11 MED ORDER — ESCITALOPRAM OXALATE 20 MG PO TABS
20.0000 mg | ORAL_TABLET | Freq: Every day | ORAL | 1 refills | Status: DC
Start: 2023-05-11 — End: 2023-11-10

## 2023-05-11 MED ORDER — AMLODIPINE BESYLATE 5 MG PO TABS: 5.0000 mg | ORAL_TABLET | Freq: Every day | ORAL | 1 refills | Status: DC

## 2023-05-11 MED ORDER — BUSPIRONE HCL 5 MG PO TABS: 5.0000 mg | ORAL_TABLET | Freq: Three times a day (TID) | ORAL | 1 refills | Status: DC | PRN

## 2023-05-11 NOTE — Patient Instructions (Signed)
Please call the care guide team at [phone] if you need to cancel or reschedule your appointment.   If you are experiencing a Mental Health or Behavioral Health Crisis or need someone to talk to, please call the Suicide and Crisis Lifeline: 988 call the [place] National Suicide Prevention Lifeline: 309-100-4512 or TTY: (671)308-2247 TTY 458 263 9370) to talk to a trained counselor call 1-800-273-TALK (toll free, 24 hour hotline)   Following is a copy of the CCM Program Consent:  CCM service includes personalized support from designated clinical staff supervised by the physician, including individualized plan of care and coordination with other care providers 24/7 contact phone numbers for assistance for urgent and routine care needs. Service will only be billed when office clinical staff spend 20 minutes or more in a month to coordinate care. Only one practitioner may furnish and bill the service in a calendar month. The patient may stop CCM services at amy time (effective at the end of the month) by phone call to the office staff. The patient will be responsible for cost sharing (co-pay) or up to 20% of the service fee (after annual deductible is met)  Following is a copy of your full provider care plan:   Goals Addressed             This Visit's Progress    CCM Expected Outcome:  Monitor, Self-Manage and Reduce Symptoms of: Pre-Diabetes       Current Barriers:  Knowledge Deficits related to dietary changes to help with managing pre-DM Chronic Disease Management support and education needs related to effective management of pre-DM with hospitalization for GI bleed in September  Lab Results  Component Value Date   HGBA1C 5.7 (A) 12/24/2021     Planned Interventions: Provided education to patient about basic DM disease process. The patient denies any issues with her DM. Knows that she needs to have new blood work.  Reviewed prescribed diet with patient heart healthy/ADA diet.  Review of dietary restrictions. The patient is mindful of what she is eating especially since she has ulcers.; Counseled on importance of regular laboratory monitoring as prescribed. Review of last A1C in 12-2021 last year. The patient knows she needs to get updated labs. Denies any new issues        Discussed plans with patient for ongoing care management follow up and provided patient with direct contact information for care management team;      Provided patient with written educational materials related to hypo and hyperglycemia and importance of correct treatment. Review of the signs and symptoms of hypo and hyperglycemia.        Reviewed scheduled/upcoming provider appointments including: 08-18-2023 at 0820 am        call provider for findings outside established parameters;       Review of patient status, including review of consultants reports, relevant laboratory and other test results, and medications completed;       Advised patient to discuss changes that would warrant taking medications for DM, questions or concerns related to effective management of DM with provider;      The patient was in the hospital for a GI bleed in September. The patient states her GI symptoms are better.  The patient states her GI symptoms are stable. The patient had hemorrhoid banding in April. She is doing well. Denies any acute changes.   Incoming call from the patient today asking for assistance with getting hermedications changed over to a different pharmacy. Her pharmacy closed down. Collaboration with  the pharm D and pcp asking for assistance with requested needs.  Symptom Management: Attend all scheduled provider appointments Call provider office for new concerns or questions  call the Suicide and Crisis Lifeline: 988 call the [place] National Suicide Prevention Lifeline: 7821612957 or TTY: 629-470-1375 TTY 737-191-6112) to talk to a trained counselor call 1-800-273-TALK (toll free, 24 hour hotline) if  experiencing a Mental Health or Behavioral Health Crisis  check feet daily for cuts, sores or redness trim toenails straight across manage portion size wash and dry feet carefully every day wear comfortable, cotton socks wear comfortable, well-fitting shoes  Follow Up Plan: Telephone follow up appointment with care management team member scheduled for: 06-27-2023 at 9 am       CCM Expected Outcome:  Monitor, Self-Manage, and Reduce Symptoms of Hypertension       Current Barriers:  Knowledge Deficits related to controlling blood pressures and monitoring for changes in blood pressure Care Coordination needs related to knowing how to effectively manage blood pressures and calling the office for changes in blood pressures and when to take medications and when to hold in a patient with HTN Chronic Disease Management support and education needs related to effective management of HTN  BP Readings from Last 3 Encounters:  05/04/23 117/76  03/27/23 130/81  03/24/23 107/68     Planned Interventions: Evaluation of current treatment plan related to hypertension self management and patient's adherence to plan as established by provider. The patient has stable blood pressures, no acute changes noted;  The patient blood pressures are stable. Denies any new concerns related to HTN or heart health Provided education to patient re: stroke prevention, s/s of heart attack and stroke; Reviewed prescribed diet heart healthy diet, avoiding spicy foods due to recent GI Bleed. GI sx and sx have improved and the patient is using Peppermint capsules per directions of the pcp. Reviewed medications with patient and discussed importance of compliance. The patient is compliant with medications. Did call asking for assistance with getting her medication changed over to a new pharmacy as her pharmacy has closed down. In basket message sent to the pcp and pharm D for assistance with getting RX changed to the [facility]  near her apartment. Discussed plans with patient for ongoing care management follow up and provided patient with direct contact information for care management team; Advised patient, providing education and rationale, to monitor blood pressure daily and record, calling PCP for findings outside established parameters;  Reviewed scheduled/upcoming provider appointments including:  next appointment on 08-18-2023. Knows to call for changes or new needs. Advised patient to discuss changes in blood pressures that warrant changes in medications or plan of care with provider; Provided education on prescribed diet heart healthy diet, avoiding spicy foods;  Discussed complications of poorly controlled blood pressure such as heart disease, stroke, circulatory complications, vision complications, kidney impairment, sexual dysfunction;  Screening for signs and symptoms of depression related to chronic disease state;  Assessed social determinant of health barriers;  The patient is wanting to get her preventative health done. The patient received a letter about her mammogram. Answered questions and assisted with scheduling of her mammogram locally.   Symptom Management: Take medications as prescribed   Attend all scheduled provider appointments Call pharmacy for medication refills 3-7 days in advance of running out of medications Call provider office for new concerns or questions  call the Suicide and Crisis Lifeline: 988 call the [place] National Suicide Prevention Lifeline: 781-881-2354 or TTY: (484)546-6545 TTY 717-318-8504)  to talk to a trained counselor call 1-800-273-TALK (toll free, 24 hour hotline) if experiencing a Mental Health or Behavioral Health Crisis   Follow Up Plan: Telephone follow up appointment with care management team member scheduled for: 06-27-2023 at 0900 am       CCM:  Maintain, Monitor and Self-Manage Symptoms of COPD       Current Barriers:  Knowledge Deficits related to triggers  and factors that may cause COPD Exacerbation Chronic Disease Management support and education needs related to COPD  Planned Interventions: Provided patient with basic written and verbal COPD education on self care/management/and exacerbation prevention. The patient is no longer working and states her COPD is stable. Did get some eye drops by visine that are working well for her when she is experiencing allergies related to eyes and drainage. She denies any acute findings today related to her breathing. Advised patient to track and manage COPD triggers. Review of changes in weather and monitoring for others around her being sick, education on staying safe and protecting self during peak sickness times. Is monitoring for changes in her breathing. Denies any acute changes in her COPD. Stays out of the heat.   Provided  verbal instructions on pursed lip breathing and utilized returned demonstration as teach back Advised patient to self assesses COPD action plan zone and make appointment with provider if in the yellow zone for 48 hours without improvement. Review of monitoring for changes and taking medications as directed. Discussed calling the office for changes or new concerns with COPD and effective management of COPD. Advised patient to engage in light exercise as tolerated 3-5 days a week to aid in the the management of COPD Provided education about and advised patient to utilize infection prevention strategies to reduce risk of respiratory infection. Review of risk factors Discussed the importance of adequate rest and management of fatigue with COPD Screening for signs and symptoms of depression related to chronic disease state  Assessed social determinant of health barriers Needed assistance with getting her medications changes. Have sent a message to the pcp and pharm D asking for help with getting medications changed over.  Symptom Management: Attend all scheduled provider appointments Call  provider office for new concerns or questions  call the Suicide and Crisis Lifeline: 988 call the [place] National Suicide Prevention Lifeline: 778-098-6584 or TTY: (225)112-7665 TTY 832-754-6160) to talk to a trained counselor call 1-800-273-TALK (toll free, 24 hour hotline) if experiencing a Mental Health or Behavioral Health Crisis  avoid second hand smoke eliminate smoking in my home identify and remove indoor air pollutants limit outdoor activity during cold weather listen for public air quality announcements every day develop a rescue plan eliminate symptom triggers at home follow rescue plan if symptoms flare-up develop a new routine to improve sleep get at least 7 to 8 hours of sleep at night  Follow Up Plan: Telephone follow up appointment with care management team member scheduled for: 06-27-2023 at 9 am          Patient verbalizes understanding of instructions and care plan provided today and agrees to view in MyChart. Active MyChart status and patient understanding of how to access instructions and care plan via MyChart confirmed with patient.  Telephone follow up appointment with care management team member scheduled for: 06-27-2023 at 0900 am

## 2023-05-11 NOTE — Telephone Encounter (Signed)
   CCM RN Visit Note   05-11-2023 Name: Cheryl Payne MRN: [id number]      DOB: 1966/06/17  Subjective: Cheryl Payne is a 57 y.o. year old female who is a primary care patient of [name], DO. The patient was referred to the Chronic Care Management team for assistance with care management needs subsequent to provider initiation of CCM services and plan of care.      Today's Visit: Engaged with patient by telephone for  return call from the patient giving information to the [facility] for pharmacy address and telephone number of where she wanted her medications to be transferred since the other pharmacy near her had closed. Collaboration with the pcp for expressed needs .   Brief incoming call from the patient to verify she wanted her medications scripts to be sent to [facility] at [address] [place], [place], [address]. Phone number [phone]. The pcp has been notified and the pcp has sent refill request for medications to [facility].      Plan:Telephone follow up appointment with care management team member scheduled for:  06-27-2023 at 0900 am  Alto Denver RN, MSN, CCM RN Care Manager  Chronic Care Management Direct Number: [phone]

## 2023-05-11 NOTE — Chronic Care Management (AMB) (Signed)
Chronic Care Management   CCM RN Visit Note  05/11/2023 Name: Cheryl Payne MRN: [id number] DOB: [birth year]  Subjective: Cheryl Payne is a 57 y.o. year old female who is a primary care patient of [name], DO. The patient was referred to the Chronic Care Management team for assistance with care management needs subsequent to provider initiation of CCM services and plan of care.    Today's Visit:  Engaged with patient by telephone for follow up visit.        Goals Addressed             This Visit's Progress    CCM Expected Outcome:  Monitor, Self-Manage and Reduce Symptoms of: Pre-Diabetes       Current Barriers:  Knowledge Deficits related to dietary changes to help with managing pre-DM Chronic Disease Management support and education needs related to effective management of pre-DM with hospitalization for GI bleed in September  Lab Results  Component Value Date   HGBA1C 5.7 (A) 12/24/2021     Planned Interventions: Provided education to patient about basic DM disease process. The patient denies any issues with her DM. Knows that she needs to have new blood work.  Reviewed prescribed diet with patient heart healthy/ADA diet. Review of dietary restrictions. The patient is mindful of what she is eating especially since she has ulcers.; Counseled on importance of regular laboratory monitoring as prescribed. Review of last A1C in 12-2021 last year. The patient knows she needs to get updated labs. Denies any new issues        Discussed plans with patient for ongoing care management follow up and provided patient with direct contact information for care management team;      Provided patient with written educational materials related to hypo and hyperglycemia and importance of correct treatment. Review of the signs and symptoms of hypo and hyperglycemia.        Reviewed scheduled/upcoming provider appointments including: 08-18-2023 at 0820 am        call provider for  findings outside established parameters;       Review of patient status, including review of consultants reports, relevant laboratory and other test results, and medications completed;       Advised patient to discuss changes that would warrant taking medications for DM, questions or concerns related to effective management of DM with provider;      The patient was in the hospital for a GI bleed in September. The patient states her GI symptoms are better.  The patient states her GI symptoms are stable. The patient had hemorrhoid banding in April. She is doing well. Denies any acute changes.   Incoming call from the patient today asking for assistance with getting hermedications changed over to a different pharmacy. Her pharmacy closed down. Collaboration with the pharm D and pcp asking for assistance with requested needs.  Symptom Management: Attend all scheduled provider appointments Call provider office for new concerns or questions  call the Suicide and Crisis Lifeline: 988 call the [place] National Suicide Prevention Lifeline: (867)195-7566 or TTY: 684-137-8538 TTY (708) 706-0216) to talk to a trained counselor call 1-800-273-TALK (toll free, 24 hour hotline) if experiencing a Mental Health or Behavioral Health Crisis  check feet daily for cuts, sores or redness trim toenails straight across manage portion size wash and dry feet carefully every day wear comfortable, cotton socks wear comfortable, well-fitting shoes  Follow Up Plan: Telephone follow up appointment with care management team member scheduled for: 06-27-2023 at 9 am  CCM Expected Outcome:  Monitor, Self-Manage, and Reduce Symptoms of Hypertension       Current Barriers:  Knowledge Deficits related to controlling blood pressures and monitoring for changes in blood pressure Care Coordination needs related to knowing how to effectively manage blood pressures and calling the office for changes in blood pressures and when to  take medications and when to hold in a patient with HTN Chronic Disease Management support and education needs related to effective management of HTN  BP Readings from Last 3 Encounters:  05/04/23 117/76  03/27/23 130/81  03/24/23 107/68     Planned Interventions: Evaluation of current treatment plan related to hypertension self management and patient's adherence to plan as established by provider. The patient has stable blood pressures, no acute changes noted;  The patient blood pressures are stable. Denies any new concerns related to HTN or heart health Provided education to patient re: stroke prevention, s/s of heart attack and stroke; Reviewed prescribed diet heart healthy diet, avoiding spicy foods due to recent GI Bleed. GI sx and sx have improved and the patient is using Peppermint capsules per directions of the pcp. Reviewed medications with patient and discussed importance of compliance. The patient is compliant with medications. Did call asking for assistance with getting her medication changed over to a new pharmacy as her pharmacy has closed down. In basket message sent to the pcp and pharm D for assistance with getting RX changed to the [facility] near her apartment. Discussed plans with patient for ongoing care management follow up and provided patient with direct contact information for care management team; Advised patient, providing education and rationale, to monitor blood pressure daily and record, calling PCP for findings outside established parameters;  Reviewed scheduled/upcoming provider appointments including:  next appointment on 08-18-2023. Knows to call for changes or new needs. Advised patient to discuss changes in blood pressures that warrant changes in medications or plan of care with provider; Provided education on prescribed diet heart healthy diet, avoiding spicy foods;  Discussed complications of poorly controlled blood pressure such as heart disease, stroke,  circulatory complications, vision complications, kidney impairment, sexual dysfunction;  Screening for signs and symptoms of depression related to chronic disease state;  Assessed social determinant of health barriers;  The patient is wanting to get her preventative health done. The patient received a letter about her mammogram. Answered questions and assisted with scheduling of her mammogram locally.   Symptom Management: Take medications as prescribed   Attend all scheduled provider appointments Call pharmacy for medication refills 3-7 days in advance of running out of medications Call provider office for new concerns or questions  call the Suicide and Crisis Lifeline: 988 call the [place] National Suicide Prevention Lifeline: 5414120755 or TTY: (825) 583-3267 TTY 251-673-9618) to talk to a trained counselor call 1-800-273-TALK (toll free, 24 hour hotline) if experiencing a Mental Health or Behavioral Health Crisis   Follow Up Plan: Telephone follow up appointment with care management team member scheduled for: 06-27-2023 at 0900 am       CCM:  Maintain, Monitor and Self-Manage Symptoms of COPD       Current Barriers:  Knowledge Deficits related to triggers and factors that may cause COPD Exacerbation Chronic Disease Management support and education needs related to COPD  Planned Interventions: Provided patient with basic written and verbal COPD education on self care/management/and exacerbation prevention. The patient is no longer working and states her COPD is stable. Did get some eye drops by visine that are  working well for her when she is experiencing allergies related to eyes and drainage. She denies any acute findings today related to her breathing. Advised patient to track and manage COPD triggers. Review of changes in weather and monitoring for others around her being sick, education on staying safe and protecting self during peak sickness times. Is monitoring for changes in her  breathing. Denies any acute changes in her COPD. Stays out of the heat.   Provided  verbal instructions on pursed lip breathing and utilized returned demonstration as teach back Advised patient to self assesses COPD action plan zone and make appointment with provider if in the yellow zone for 48 hours without improvement. Review of monitoring for changes and taking medications as directed. Discussed calling the office for changes or new concerns with COPD and effective management of COPD. Advised patient to engage in light exercise as tolerated 3-5 days a week to aid in the the management of COPD Provided education about and advised patient to utilize infection prevention strategies to reduce risk of respiratory infection. Review of risk factors Discussed the importance of adequate rest and management of fatigue with COPD Screening for signs and symptoms of depression related to chronic disease state  Assessed social determinant of health barriers Needed assistance with getting her medications changes. Have sent a message to the pcp and pharm D asking for help with getting medications changed over.  Symptom Management: Attend all scheduled provider appointments Call provider office for new concerns or questions  call the Suicide and Crisis Lifeline: 988 call the [place] National Suicide Prevention Lifeline: 209-869-5048 or TTY: 216-294-7762 TTY (773)424-9100) to talk to a trained counselor call 1-800-273-TALK (toll free, 24 hour hotline) if experiencing a Mental Health or Behavioral Health Crisis  avoid second hand smoke eliminate smoking in my home identify and remove indoor air pollutants limit outdoor activity during cold weather listen for public air quality announcements every day develop a rescue plan eliminate symptom triggers at home follow rescue plan if symptoms flare-up develop a new routine to improve sleep get at least 7 to 8 hours of sleep at night  Follow Up Plan: Telephone  follow up appointment with care management team member scheduled for: 06-27-2023 at 9 am          Plan:Telephone follow up appointment with care management team member scheduled for:  06-27-2023 at 0900 am  Alto Denver RN, MSN, CCM RN Care Manager  Chronic Care Management Direct Number: [phone]

## 2023-05-13 ENCOUNTER — Other Ambulatory Visit: Payer: Self-pay | Admitting: Family Medicine

## 2023-05-13 DIAGNOSIS — I10 Essential (primary) hypertension: Secondary | ICD-10-CM

## 2023-05-15 NOTE — Telephone Encounter (Signed)
No confirmed receipt from pharmacy, class: print.   Requested Prescriptions  Pending Prescriptions Disp Refills   amLODipine (NORVASC) 5 MG tablet [Pharmacy Med Name: AMLODIPINE BESYLATE 5MG  TABLETS] 90 tablet 1    Sig: TAKE 1 TABLET(5 MG) BY MOUTH DAILY     Cardiovascular: Calcium Channel Blockers 2 Passed - 05/13/2023  7:03 AM      Passed - Last BP in normal range    BP Readings from Last 1 Encounters:  05/04/23 117/76         Passed - Last Heart Rate in normal range    Pulse Readings from Last 1 Encounters:  05/04/23 80         Passed - Valid encounter within last 6 months    Recent Outpatient Visits           3 months ago Pre-diabetes   [place] [facility] [place], Netta Neat, DO   5 months ago Essential hypertension   [place] [facility] [name], DO   8 months ago Upper GI bleed   [place] [facility] [name], DO   1 year ago Chronic left shoulder pain   [place] [facility] [name], DO   1 year ago Vaginal discharge   [place] [facility] [name], DO       Future Appointments             In 1 month Wyline Mood, MD [facility] [place]   In 3 months Althea Charon, Netta Neat, DO [facility], [facility]

## 2023-05-30 DIAGNOSIS — R220 Localized swelling, mass and lump, head: Secondary | ICD-10-CM | POA: Diagnosis not present

## 2023-06-04 DIAGNOSIS — I10 Essential (primary) hypertension: Secondary | ICD-10-CM

## 2023-06-04 DIAGNOSIS — J432 Centrilobular emphysema: Secondary | ICD-10-CM

## 2023-06-09 ENCOUNTER — Other Ambulatory Visit: Payer: Self-pay | Admitting: Family Medicine

## 2023-06-09 DIAGNOSIS — I10 Essential (primary) hypertension: Secondary | ICD-10-CM

## 2023-06-09 MED ORDER — AMLODIPINE BESYLATE 5 MG PO TABS: 5.0000 mg | ORAL_TABLET | Freq: Every day | ORAL | 1 refills | Status: DC

## 2023-06-14 ENCOUNTER — Other Ambulatory Visit: Payer: Self-pay

## 2023-06-19 ENCOUNTER — Ambulatory Visit: Payer: 59 | Admitting: Gastroenterology

## 2023-06-19 ENCOUNTER — Encounter: Payer: Self-pay | Admitting: Gastroenterology

## 2023-06-19 VITALS — BP 120/74 | HR 79 | Temp 98.2°F | Ht 64.0 in | Wt 199.4 lb

## 2023-06-19 DIAGNOSIS — K648 Other hemorrhoids: Secondary | ICD-10-CM | POA: Diagnosis not present

## 2023-06-19 NOTE — Progress Notes (Signed)
Patient follow-ups today for banding of hemorrhoids    Summary of history : Has perianal discomfort on and off bleeding associated with a bowel movement would like to get hemorrhoidal banding done today        First round:03/27/2023:LL column banded   Second round:05/04/2023: RA column banded    Interval history     Doing well no bleeding    Digital rectal exam performed in the presence of a chaperone. External anal findings: Normal Internal findings: , No masses, no blood on glove noticed.    PROCEDURE NOTE: The patient presents with symptomatic grade 1 hemorrhoids, unresponsive to maximal medical therapy, requesting rubber band ligation of his/her hemorrhoidal disease.  All risks, benefits and alternative forms of therapy were described and informed consent was obtained.  In the Left Lateral Decubitus position (if anoscopy is performed) anoscopic examination revealed grade 1 hemorrhoids in the RP position(s).   The decision was made to band the RP internal hemorrhoid, and the [facility] was used to perform band ligation without complication.  Digital anorectal examination was then performed to assure proper positioning of the band, and to adjust the banded tissue as required.  The patient was discharged home without pain or other issues.  Dietary and behavioral recommendations were given and (if necessary - prescriptions were given), along with follow-up instructions.  The patient will return  as needed for follow-up and possible additional banding as required.  No complications were encountered and the patient tolerated the procedure well.   Plan:  Avoid constipation.    Follow-up: As needed  Dr Wyline Mood MD,MRCP [facility]) Gastroenterology/Hepatology Pager: [phone]

## 2023-06-21 DIAGNOSIS — C3411 Malignant neoplasm of upper lobe, right bronchus or lung: Secondary | ICD-10-CM | POA: Diagnosis not present

## 2023-06-27 ENCOUNTER — Ambulatory Visit (INDEPENDENT_AMBULATORY_CARE_PROVIDER_SITE_OTHER): Payer: 59

## 2023-06-27 ENCOUNTER — Telehealth: Payer: 59

## 2023-06-27 DIAGNOSIS — J432 Centrilobular emphysema: Secondary | ICD-10-CM

## 2023-06-27 DIAGNOSIS — R7303 Prediabetes: Secondary | ICD-10-CM

## 2023-06-27 DIAGNOSIS — I10 Essential (primary) hypertension: Secondary | ICD-10-CM

## 2023-06-27 NOTE — Chronic Care Management (AMB) (Signed)
Chronic Care Management   CCM RN Visit Note  06/27/2023 Name: Cheryl Payne MRN: [id number] DOB: 06/21/1966  Subjective: Cheryl Payne is a 57 y.o. year old female who is a primary care patient of [name], DO. The patient was referred to the Chronic Care Management team for assistance with care management needs subsequent to provider initiation of CCM services and plan of care.    Today's Visit:  Engaged with patient by telephone for follow up visit.     SDOH Interventions Today    Flowsheet Row Most Recent Value  SDOH Interventions   Health Literacy Interventions Intervention Not Indicated, Other (Comment)  [when she does not understand something she reaches out for help in making sure she understands her health and well being]         Goals Addressed             This Visit's Progress    CCM Expected Outcome:  Monitor, Self-Manage and Reduce Symptoms of: Pre-Diabetes       Current Barriers:  Knowledge Deficits related to dietary changes to help with managing pre-DM Chronic Disease Management support and education needs related to effective management of pre-DM with hospitalization for GI bleed in September  Lab Results  Component Value Date   HGBA1C 5.7 (A) 12/24/2021     Planned Interventions: Provided education to patient about basic DM disease process. The patient denies any issues with her DM. Knows that she needs to have new blood work. Reminder provided. Has her physical coming up in August with the pcp. Saw the GI specialist recently.  Reviewed prescribed diet with patient heart healthy/ADA diet. Review of dietary restrictions. The patient is mindful of what she is eating especially since she has ulcers.; Counseled on importance of regular laboratory monitoring as prescribed. Review of last A1C in 12-2021 last year. The patient knows she needs to get updated labs. Denies any new issues        Discussed plans with patient for ongoing care management  follow up and provided patient with direct contact information for care management team;      Provided patient with written educational materials related to hypo and hyperglycemia and importance of correct treatment. Review of the signs and symptoms of hypo and hyperglycemia.        Reviewed scheduled/upcoming provider appointments including: 07-21-2023 at 08 am        call provider for findings outside established parameters;       Review of patient status, including review of consultants reports, relevant laboratory and other test results, and medications completed;       Advised patient to discuss changes that would warrant taking medications for DM, questions or concerns related to effective management of DM with provider;      The patient was in the hospital for a GI bleed in September. The patient states her GI symptoms are better.  The patient states her GI symptoms are stable. The patient had hemorrhoid banding in April. She is doing well. Denies any acute changes.  Saw the GI specialist last week. She is doing well. She had to have the procedure 4 times for the banding to be successful. Had a CT scan with no acute findings. Will continue to monitor.  Incoming call from the patient today asking for assistance with getting hermedications changed over to a different pharmacy. Her pharmacy closed down. Collaboration with the pharm D and pcp asking for assistance with requested needs.  Symptom Management: Attend all  scheduled provider appointments Call provider office for new concerns or questions  call the Suicide and Crisis Lifeline: 988 call the [place] National Suicide Prevention Lifeline: 445-285-9284 or TTY: 559-169-0650 TTY 667 431 2779) to talk to a trained counselor call 1-800-273-TALK (toll free, 24 hour hotline) if experiencing a Mental Health or Behavioral Health Crisis  check feet daily for cuts, sores or redness trim toenails straight across manage portion size wash and dry feet  carefully every day wear comfortable, cotton socks wear comfortable, well-fitting shoes  Follow Up Plan: Telephone follow up appointment with care management team member scheduled for: 08-22-2023 at 9 am       CCM Expected Outcome:  Monitor, Self-Manage, and Reduce Symptoms of Hypertension       Current Barriers:  Knowledge Deficits related to controlling blood pressures and monitoring for changes in blood pressure Care Coordination needs related to knowing how to effectively manage blood pressures and calling the office for changes in blood pressures and when to take medications and when to hold in a patient with HTN Chronic Disease Management support and education needs related to effective management of HTN  BP Readings from Last 3 Encounters:  06/19/23 120/74  05/04/23 117/76  03/27/23 130/81     Planned Interventions: Evaluation of current treatment plan related to hypertension self management and patient's adherence to plan as established by provider. The patient has stable blood pressures, no acute changes noted;  The patient states she was going to start taking her blood pressures and write them down but she has not yet. Encouraged her to take if she has a headache or dizziness to see what range her blood pressures may be in. Education and support provided. Will continue to monitor for changes. Provided education to patient re: stroke prevention, s/s of heart attack and stroke; Reviewed prescribed diet heart healthy diet, avoiding spicy foods due to recent GI Bleed. GI sx and sx have improved and the patient is using Peppermint capsules per directions of the pcp. The patient is compliant with heart healthy diet and monitoring her dietary intake. Education provided.  Reviewed medications with patient and discussed importance of compliance. The patient is compliant with medications. The patient has all of her medications switched over and denies any issues with getting her medications at  this time. Discussed plans with patient for ongoing care management follow up and provided patient with direct contact information for care management team; Advised patient, providing education and rationale, to monitor blood pressure daily and record, calling PCP for findings outside established parameters;  Reviewed scheduled/upcoming provider appointments including:  next appointment on 07-21-2023. Knows to call for changes or new needs. Advised patient to discuss changes in blood pressures that warrant changes in medications or plan of care with provider; Provided education on prescribed diet heart healthy diet, avoiding spicy foods;  Discussed complications of poorly controlled blood pressure such as heart disease, stroke, circulatory complications, vision complications, kidney impairment, sexual dysfunction;  Screening for signs and symptoms of depression related to chronic disease state;  Assessed social determinant of health barriers;  The patient is wanting to get her preventative health done. The patient received a letter about her mammogram. Answered questions and assisted with scheduling of her mammogram locally.   Symptom Management: Take medications as prescribed   Attend all scheduled provider appointments Call pharmacy for medication refills 3-7 days in advance of running out of medications Call provider office for new concerns or questions  call the Suicide and Crisis Lifeline: 988 call  the [place] National Suicide Prevention Lifeline: 442-814-5166 or TTY: 719-822-4206 TTY (916) 136-8642) to talk to a trained counselor call 1-800-273-TALK (toll free, 24 hour hotline) if experiencing a Mental Health or Behavioral Health Crisis   Follow Up Plan: Telephone follow up appointment with care management team member scheduled for: 08-22-2023 at 0900 am       CCM:  Maintain, Monitor and Self-Manage Symptoms of COPD       Current Barriers:  Knowledge Deficits related to triggers and  factors that may cause COPD Exacerbation Chronic Disease Management support and education needs related to COPD  Planned Interventions: Provided patient with basic written and verbal COPD education on self care/management/and exacerbation prevention. The patient denies any acute changes in her COPD. She states when it is so hot she tries to stay in and not go out. She states that overall she feels like she is doing very well.  Advised patient to track and manage COPD triggers. Review of changes in weather and monitoring for others around her being sick, education on staying safe and protecting self during peak sickness times. Is monitoring for changes in her breathing. Denies any acute changes in her COPD. Stays out of the heat.   Provided  verbal instructions on pursed lip breathing and utilized returned demonstration as teach back Advised patient to self assesses COPD action plan zone and make appointment with provider if in the yellow zone for 48 hours without improvement. Review of monitoring for changes and taking medications as directed. Discussed calling the office for changes or new concerns with COPD and effective management of COPD. Has her medications. Denies any new needs with medications. Advised patient to engage in light exercise as tolerated 3-5 days a week to aid in the the management of COPD Provided education about and advised patient to utilize infection prevention strategies to reduce risk of respiratory infection. Review of risk factors Discussed the importance of adequate rest and management of fatigue with COPD Screening for signs and symptoms of depression related to chronic disease state  Assessed social determinant of health barriers She has gotten her medications changed over to her new pharmacy and denies any new medication needs at this time. Will continue to monitor.   Symptom Management: Attend all scheduled provider appointments Call provider office for new concerns  or questions  call the Suicide and Crisis Lifeline: 988 call the [place] National Suicide Prevention Lifeline: (762)035-9605 or TTY: (938)100-9812 TTY 7540044924) to talk to a trained counselor call 1-800-273-TALK (toll free, 24 hour hotline) if experiencing a Mental Health or Behavioral Health Crisis  avoid second hand smoke eliminate smoking in my home identify and remove indoor air pollutants limit outdoor activity during cold weather listen for public air quality announcements every day develop a rescue plan eliminate symptom triggers at home follow rescue plan if symptoms flare-up develop a new routine to improve sleep get at least 7 to 8 hours of sleep at night  Follow Up Plan: Telephone follow up appointment with care management team member scheduled for: 08-22-2023 at 9 am          Plan:Telephone follow up appointment with care management team member scheduled for:  08-22-2023 at 0900 am  Alto Denver RN, MSN, CCM RN Care Manager  Chronic Care Management Direct Number: [phone]

## 2023-06-27 NOTE — Patient Instructions (Signed)
Please call the care guide team at [phone] if you need to cancel or reschedule your appointment.   If you are experiencing a Mental Health or Behavioral Health Crisis or need someone to talk to, please call the Suicide and Crisis Lifeline: 988 call the [place] National Suicide Prevention Lifeline: 262-279-6098 or TTY: (650)202-1638 TTY 920-629-1183) to talk to a trained counselor call 1-800-273-TALK (toll free, 24 hour hotline)   Following is a copy of the CCM Program Consent:  CCM service includes personalized support from designated clinical staff supervised by the physician, including individualized plan of care and coordination with other care providers 24/7 contact phone numbers for assistance for urgent and routine care needs. Service will only be billed when office clinical staff spend 20 minutes or more in a month to coordinate care. Only one practitioner may furnish and bill the service in a calendar month. The patient may stop CCM services at amy time (effective at the end of the month) by phone call to the office staff. The patient will be responsible for cost sharing (co-pay) or up to 20% of the service fee (after annual deductible is met)  Following is a copy of your full provider care plan:   Goals Addressed             This Visit's Progress    CCM Expected Outcome:  Monitor, Self-Manage and Reduce Symptoms of: Pre-Diabetes       Current Barriers:  Knowledge Deficits related to dietary changes to help with managing pre-DM Chronic Disease Management support and education needs related to effective management of pre-DM with hospitalization for GI bleed in September  Lab Results  Component Value Date   HGBA1C 5.7 (A) 12/24/2021     Planned Interventions: Provided education to patient about basic DM disease process. The patient denies any issues with her DM. Knows that she needs to have new blood work. Reminder provided. Has her physical coming up in August with the pcp.  Saw the GI specialist recently.  Reviewed prescribed diet with patient heart healthy/ADA diet. Review of dietary restrictions. The patient is mindful of what she is eating especially since she has ulcers.; Counseled on importance of regular laboratory monitoring as prescribed. Review of last A1C in 12-2021 last year. The patient knows she needs to get updated labs. Denies any new issues        Discussed plans with patient for ongoing care management follow up and provided patient with direct contact information for care management team;      Provided patient with written educational materials related to hypo and hyperglycemia and importance of correct treatment. Review of the signs and symptoms of hypo and hyperglycemia.        Reviewed scheduled/upcoming provider appointments including: 07-21-2023 at 08 am        call provider for findings outside established parameters;       Review of patient status, including review of consultants reports, relevant laboratory and other test results, and medications completed;       Advised patient to discuss changes that would warrant taking medications for DM, questions or concerns related to effective management of DM with provider;      The patient was in the hospital for a GI bleed in September. The patient states her GI symptoms are better.  The patient states her GI symptoms are stable. The patient had hemorrhoid banding in April. She is doing well. Denies any acute changes.  Saw the GI specialist last week. She is  doing well. She had to have the procedure 4 times for the banding to be successful. Had a CT scan with no acute findings. Will continue to monitor.  Incoming call from the patient today asking for assistance with getting hermedications changed over to a different pharmacy. Her pharmacy closed down. Collaboration with the pharm D and pcp asking for assistance with requested needs.  Symptom Management: Attend all scheduled provider appointments Call  provider office for new concerns or questions  call the Suicide and Crisis Lifeline: 988 call the [place] National Suicide Prevention Lifeline: 314-057-0149 or TTY: (236)085-9156 TTY 203-793-9858) to talk to a trained counselor call 1-800-273-TALK (toll free, 24 hour hotline) if experiencing a Mental Health or Behavioral Health Crisis  check feet daily for cuts, sores or redness trim toenails straight across manage portion size wash and dry feet carefully every day wear comfortable, cotton socks wear comfortable, well-fitting shoes  Follow Up Plan: Telephone follow up appointment with care management team member scheduled for: 08-22-2023 at 9 am       CCM Expected Outcome:  Monitor, Self-Manage, and Reduce Symptoms of Hypertension       Current Barriers:  Knowledge Deficits related to controlling blood pressures and monitoring for changes in blood pressure Care Coordination needs related to knowing how to effectively manage blood pressures and calling the office for changes in blood pressures and when to take medications and when to hold in a patient with HTN Chronic Disease Management support and education needs related to effective management of HTN  BP Readings from Last 3 Encounters:  06/19/23 120/74  05/04/23 117/76  03/27/23 130/81     Planned Interventions: Evaluation of current treatment plan related to hypertension self management and patient's adherence to plan as established by provider. The patient has stable blood pressures, no acute changes noted;  The patient states she was going to start taking her blood pressures and write them down but she has not yet. Encouraged her to take if she has a headache or dizziness to see what range her blood pressures may be in. Education and support provided. Will continue to monitor for changes. Provided education to patient re: stroke prevention, s/s of heart attack and stroke; Reviewed prescribed diet heart healthy diet, avoiding spicy  foods due to recent GI Bleed. GI sx and sx have improved and the patient is using Peppermint capsules per directions of the pcp. The patient is compliant with heart healthy diet and monitoring her dietary intake. Education provided.  Reviewed medications with patient and discussed importance of compliance. The patient is compliant with medications. The patient has all of her medications switched over and denies any issues with getting her medications at this time. Discussed plans with patient for ongoing care management follow up and provided patient with direct contact information for care management team; Advised patient, providing education and rationale, to monitor blood pressure daily and record, calling PCP for findings outside established parameters;  Reviewed scheduled/upcoming provider appointments including:  next appointment on 07-21-2023. Knows to call for changes or new needs. Advised patient to discuss changes in blood pressures that warrant changes in medications or plan of care with provider; Provided education on prescribed diet heart healthy diet, avoiding spicy foods;  Discussed complications of poorly controlled blood pressure such as heart disease, stroke, circulatory complications, vision complications, kidney impairment, sexual dysfunction;  Screening for signs and symptoms of depression related to chronic disease state;  Assessed social determinant of health barriers;  The patient is wanting to  get her preventative health done. The patient received a letter about her mammogram. Answered questions and assisted with scheduling of her mammogram locally.   Symptom Management: Take medications as prescribed   Attend all scheduled provider appointments Call pharmacy for medication refills 3-7 days in advance of running out of medications Call provider office for new concerns or questions  call the Suicide and Crisis Lifeline: 988 call the [place] National Suicide Prevention Lifeline:  812-746-4328 or TTY: (660)060-0801 TTY (602) 104-0003) to talk to a trained counselor call 1-800-273-TALK (toll free, 24 hour hotline) if experiencing a Mental Health or Behavioral Health Crisis   Follow Up Plan: Telephone follow up appointment with care management team member scheduled for: 08-22-2023 at 0900 am       CCM:  Maintain, Monitor and Self-Manage Symptoms of COPD       Current Barriers:  Knowledge Deficits related to triggers and factors that may cause COPD Exacerbation Chronic Disease Management support and education needs related to COPD  Planned Interventions: Provided patient with basic written and verbal COPD education on self care/management/and exacerbation prevention. The patient denies any acute changes in her COPD. She states when it is so hot she tries to stay in and not go out. She states that overall she feels like she is doing very well.  Advised patient to track and manage COPD triggers. Review of changes in weather and monitoring for others around her being sick, education on staying safe and protecting self during peak sickness times. Is monitoring for changes in her breathing. Denies any acute changes in her COPD. Stays out of the heat.   Provided  verbal instructions on pursed lip breathing and utilized returned demonstration as teach back Advised patient to self assesses COPD action plan zone and make appointment with provider if in the yellow zone for 48 hours without improvement. Review of monitoring for changes and taking medications as directed. Discussed calling the office for changes or new concerns with COPD and effective management of COPD. Has her medications. Denies any new needs with medications. Advised patient to engage in light exercise as tolerated 3-5 days a week to aid in the the management of COPD Provided education about and advised patient to utilize infection prevention strategies to reduce risk of respiratory infection. Review of risk  factors Discussed the importance of adequate rest and management of fatigue with COPD Screening for signs and symptoms of depression related to chronic disease state  Assessed social determinant of health barriers She has gotten her medications changed over to her new pharmacy and denies any new medication needs at this time. Will continue to monitor.   Symptom Management: Attend all scheduled provider appointments Call provider office for new concerns or questions  call the Suicide and Crisis Lifeline: 988 call the [place] National Suicide Prevention Lifeline: (402) 869-6667 or TTY: 858-037-4422 TTY 361-034-5942) to talk to a trained counselor call 1-800-273-TALK (toll free, 24 hour hotline) if experiencing a Mental Health or Behavioral Health Crisis  avoid second hand smoke eliminate smoking in my home identify and remove indoor air pollutants limit outdoor activity during cold weather listen for public air quality announcements every day develop a rescue plan eliminate symptom triggers at home follow rescue plan if symptoms flare-up develop a new routine to improve sleep get at least 7 to 8 hours of sleep at night  Follow Up Plan: Telephone follow up appointment with care management team member scheduled for: 08-22-2023 at 9 am  Patient verbalizes understanding of instructions and care plan provided today and agrees to view in MyChart. Active MyChart status and patient understanding of how to access instructions and care plan via MyChart confirmed with patient.  Telephone follow up appointment with care management team member scheduled for: 08-22-2023 at 0900 am

## 2023-06-29 DIAGNOSIS — C3411 Malignant neoplasm of upper lobe, right bronchus or lung: Secondary | ICD-10-CM | POA: Diagnosis not present

## 2023-07-05 DIAGNOSIS — J432 Centrilobular emphysema: Secondary | ICD-10-CM

## 2023-07-05 DIAGNOSIS — I10 Essential (primary) hypertension: Secondary | ICD-10-CM

## 2023-07-10 ENCOUNTER — Other Ambulatory Visit: Payer: Self-pay | Admitting: Family Medicine

## 2023-07-10 DIAGNOSIS — F411 Generalized anxiety disorder: Secondary | ICD-10-CM

## 2023-07-11 NOTE — Telephone Encounter (Signed)
Requested Prescriptions  Pending Prescriptions Disp Refills   busPIRone (BUSPAR) 5 MG tablet [Pharmacy Med Name: busPIRone HCL 5 MG TABLET] 90 tablet 1    Sig: TAKE 1 TABLET BY MOUTH 3 TIMES A DAY AS NEEDED FOR ANXIETY     Psychiatry: Anxiolytics/Hypnotics - Non-controlled Passed - 07/10/2023  6:23 AM      Passed - Valid encounter within last 12 months    Recent Outpatient Visits           5 months ago Pre-diabetes   [place] [facility] [name], DO   7 months ago Essential hypertension   [place] [facility] [name], DO   10 months ago Upper GI bleed   [place] [facility] [name], DO   1 year ago Chronic left shoulder pain   [place] [facility] [name], DO   1 year ago Vaginal discharge   [place] [facility] [name], DO       Future Appointments             In 1 week Althea Charon, Netta Neat, DO [place] [facility]

## 2023-07-13 ENCOUNTER — Other Ambulatory Visit: Payer: Self-pay

## 2023-07-13 DIAGNOSIS — I10 Essential (primary) hypertension: Secondary | ICD-10-CM

## 2023-07-13 DIAGNOSIS — R7303 Prediabetes: Secondary | ICD-10-CM

## 2023-07-13 DIAGNOSIS — Z0000 Encounter for general adult medical examination without abnormal findings: Secondary | ICD-10-CM

## 2023-07-13 DIAGNOSIS — E7800 Pure hypercholesterolemia, unspecified: Secondary | ICD-10-CM

## 2023-07-14 ENCOUNTER — Other Ambulatory Visit: Payer: 59

## 2023-07-14 DIAGNOSIS — I10 Essential (primary) hypertension: Secondary | ICD-10-CM | POA: Diagnosis not present

## 2023-07-14 DIAGNOSIS — Z0000 Encounter for general adult medical examination without abnormal findings: Secondary | ICD-10-CM | POA: Diagnosis not present

## 2023-07-14 DIAGNOSIS — E7800 Pure hypercholesterolemia, unspecified: Secondary | ICD-10-CM | POA: Diagnosis not present

## 2023-07-14 DIAGNOSIS — R7303 Prediabetes: Secondary | ICD-10-CM | POA: Diagnosis not present

## 2023-07-20 ENCOUNTER — Telehealth: Payer: Self-pay | Admitting: Family Medicine

## 2023-07-20 NOTE — Telephone Encounter (Signed)
LVM 07/20/23 at 11:43am to r/s AWV appt due to [facility] out sick. New appt date 07/28/23 at 8:30am. Please call to confirm date change or r/s khc   Verlee Rossetti; Care Guide Ambulatory Clinical Support [place] [facility] Direct Dial: [phone]

## 2023-07-21 ENCOUNTER — Ambulatory Visit (INDEPENDENT_AMBULATORY_CARE_PROVIDER_SITE_OTHER): Payer: 59 | Admitting: Family Medicine

## 2023-07-21 ENCOUNTER — Encounter: Payer: Self-pay | Admitting: Family Medicine

## 2023-07-21 VITALS — BP 120/58 | HR 77 | Temp 96.6°F | Ht 64.0 in | Wt 194.3 lb

## 2023-07-21 DIAGNOSIS — F411 Generalized anxiety disorder: Secondary | ICD-10-CM | POA: Diagnosis not present

## 2023-07-21 DIAGNOSIS — L509 Urticaria, unspecified: Secondary | ICD-10-CM

## 2023-07-21 DIAGNOSIS — E7800 Pure hypercholesterolemia, unspecified: Secondary | ICD-10-CM

## 2023-07-21 DIAGNOSIS — H1013 Acute atopic conjunctivitis, bilateral: Secondary | ICD-10-CM

## 2023-07-21 DIAGNOSIS — C3491 Malignant neoplasm of unspecified part of right bronchus or lung: Secondary | ICD-10-CM

## 2023-07-21 DIAGNOSIS — I10 Essential (primary) hypertension: Secondary | ICD-10-CM

## 2023-07-21 DIAGNOSIS — M7552 Bursitis of left shoulder: Secondary | ICD-10-CM

## 2023-07-21 DIAGNOSIS — Z0000 Encounter for general adult medical examination without abnormal findings: Secondary | ICD-10-CM | POA: Diagnosis not present

## 2023-07-21 DIAGNOSIS — J432 Centrilobular emphysema: Secondary | ICD-10-CM

## 2023-07-21 DIAGNOSIS — F3341 Major depressive disorder, recurrent, in partial remission: Secondary | ICD-10-CM

## 2023-07-21 DIAGNOSIS — E669 Obesity, unspecified: Secondary | ICD-10-CM

## 2023-07-21 MED ORDER — ROSUVASTATIN CALCIUM 5 MG PO TABS
5.0000 mg | ORAL_TABLET | ORAL | 3 refills | Status: DC
Start: 2023-07-21 — End: 2023-11-14

## 2023-07-21 NOTE — Assessment & Plan Note (Signed)
Followed by multidisciplinary team ([facility] - with Oncology, Cardiothoracic Surgery) Currently without evidence of recurrence S/p chemotherapy, radiation, surgical resection RUL  Plan Continue surveillance

## 2023-07-21 NOTE — Assessment & Plan Note (Signed)
Improved HTN control - Home BP readings reviewed  Complication CAD Off Metoprolol   Plan:  1. Continue current BP regimen Amlodipine 5mg  daily 2. Encourage improved lifestyle - low sodium diet, regular exercise 3. Continue monitor BP outside office, bring readings to next visit, if persistently >140/90 or new symptoms notify office sooner

## 2023-07-21 NOTE — Assessment & Plan Note (Addendum)
Chronic problem Mostly stable and controlled but feels SSRI less effective on for 15+ years in past Has some mild chronic depression co morbid Related to chronic health problems recent few years  Continue Escitalopram 20mg , Buspar

## 2023-07-21 NOTE — Assessment & Plan Note (Signed)
See A&P for GAD Chronic recurrent mild partial remission On SSRI

## 2023-07-21 NOTE — Assessment & Plan Note (Addendum)
Controlled on statin Known CAD  Plan: 1. Continue current meds - Rosuvastatin 5mg  every other night 2. Continue ASA 81mg  for primary ASCVD risk reduction 3. Encourage improved lifestyle - low carb/cholesterol, reduce portion size, continue improving regular exercise  Consider repatha in future if interested instead of Statin therapy.

## 2023-07-21 NOTE — Assessment & Plan Note (Signed)
Stable without flare Known mild obstructive COPD identified on CT imaging / Spirometry S/p lung cancer Currently not on maintenance

## 2023-07-21 NOTE — Progress Notes (Signed)
Subjective:    Patient ID: Cheryl Payne, female    DOB: [birth year], 57 y.o.   MRN: [id number]  Cheryl Payne is a 57 y.o. female presenting on 07/21/2023 for Annual Exam   HPI  Here for Annual Physical and lab Reivew  GERD PUD Prior history, she has had GI management EGD Colonoscopy Completed PPI Now resolved CBC shows normalized Hgb   Generalized Anxiety Disorder Major Depression, chronic recurrent mild - partial remission Chronic history 10+ years in past, has had primarily issues with generalized anxiety disorder Last visit 12/02/22, we dose increased Escitalopram from 10 to 20mg  daily She is interested in some medication during the day anxiety  A1c Elevated A1c 5.9, prior 5.6 to 5.7 Goal to improve diet   Hypertension HLD - Currently taking Rosuvastatin 5mg  (half of 10mg  dose), tolerating well without side effects or myalgias - Taking Amlodipine 5mg  daily (has not taken today, until after blood work) - Taking Metoprolol tartrate 25mg  BID for cardioprotection Bp has been controlled lately, doing well   Left Shoulder Chronic Pain Bursitis Previously treated in 07/2020 and 2022, has improved in past 1 year with steroid injection with good results. Last treated 03/11/22 for same problem, chronic bursitis, she improved but it did not resolve. Now returns 1 year later still with same issue. Left shoulder, worse at night. - She had a massage yesterday with good results. - She prefers to avoid surgery   She has tried other medications muscle relaxants. Limited improve Working with PT but not for shoulder yet. Request repeat injection now 1 year later Denies injury fall numbness tingling  Centrilobular Emphysema COPD History of Tobacco Abuse, former smoker She has done spirometry was told mild obstructive lung disease or mild COPD Not using any albuterol currently has used in past. Not on maintenance therapy either. Doing well.  Non-small cell lung cancer  RUL Followed by [facility] Last visit 06/2023 checked out well January 2025 will be 5 years, then she will go yearly   Generalized Anxiety Disorder Major Depression, chronic recurrent mild - partial remission Chronic history 10+ years in past, has had primarily issues with generalized anxiety disorder   Additional issue  Allergic reaction / urticaria due to food allergy strawberries Eye itching allergy -    Health Maintenance:   Completed Pap smear per GYN, results below. NILM and Negative HPV.   Breast CA Screening: Completed Mammogram January 2024   Colon CA Screening: Last colonoscopy 03/2023, next due 3 years. 2027      07/21/2023    7:58 AM 02/10/2023    8:07 AM 12/02/2022    8:08 AM  Depression screen PHQ 2/9  Decreased Interest 2 1 1   Down, Depressed, Hopeless 1 0 1  PHQ - 2 Score 3 1 2   Altered sleeping 3 1 2   Tired, decreased energy 1 1 1   Change in appetite 1 0 1  Feeling bad or failure about yourself  0 0 0  Trouble concentrating 1 0 0  Moving slowly or fidgety/restless 0 0 0  Suicidal thoughts 0 0 0  PHQ-9 Score 9 3 6   Difficult doing work/chores Not difficult at all Not difficult at all Not difficult at all      07/21/2023    7:58 AM 02/10/2023    8:07 AM 12/02/2022    8:08 AM 12/23/2021   10:17 AM  GAD 7 : Generalized Anxiety Score  Nervous, Anxious, on Edge 1 1 1 1   Control/stop worrying 2 1 1  1  Worry too much - different things 3 1 1 1   Trouble relaxing 2 1 1  0  Restless 1 0 1 0  Easily annoyed or irritable 1 1 2 1   Afraid - awful might happen 1 0 0 0  Total GAD 7 Score 11 5 7 4   Anxiety Difficulty Somewhat difficult Not difficult at all Not difficult at all Not difficult at all     Past Medical History:  Diagnosis Date   Anxiety NA   Arthritis    Blood transfusion without reported diagnosis 2023   When i was in the hospital   Cancer [facility])    lung   Chronic left shoulder pain    Depression    GERD (gastroesophageal reflux disease)     Hypertension    Ulcer 2023   When i was in the hospital   Past Surgical History:  Procedure Laterality Date   COLONOSCOPY WITH PROPOFOL N/A 03/24/2023   Procedure: COLONOSCOPY WITH PROPOFOL;  Surgeon: Wyline Mood, MD;  Location[facility] ENDOSCOPY;  Service: Gastroenterology;  Laterality: N/A;   ESOPHAGOGASTRODUODENOSCOPY (EGD) WITH PROPOFOL N/A 08/17/2022   Procedure: ESOPHAGOGASTRODUODENOSCOPY (EGD) WITH PROPOFOL;  Surgeon: Wyline Mood, MD;  Location[facility] ENDOSCOPY;  Service: Gastroenterology;  Laterality: N/A;   ESOPHAGOGASTRODUODENOSCOPY (EGD) WITH PROPOFOL N/A 03/24/2023   Procedure: ESOPHAGOGASTRODUODENOSCOPY (EGD) WITH PROPOFOL;  Surgeon: Wyline Mood, MD;  Location[facility] ENDOSCOPY;  Service: Gastroenterology;  Laterality: N/A;   LUNG SURGERY     Social History   Socioeconomic History   Marital status: Single    Spouse name: Not on file   Number of children: Not on file   Years of education: Not on file   Highest education level: Not on file  Occupational History   Not on file  Tobacco Use   Smoking status: Former    Current packs/day: 0.00    Average packs/day: 1 pack/day for 35.0 years (35.0 ttl pk-yrs)    Types: Cigarettes    Quit date: 01/29/2019    Years since quitting: 4.4   Smokeless tobacco: Former    Quit date: 06/04/2018  Vaping Use   Vaping status: Never Used  Substance and Sexual Activity   Alcohol use: Not Currently    Comment: Dont drink anymore   Drug use: Not Currently    Types: Marijuana   Sexual activity: Not Currently    Birth control/protection: None  Other Topics Concern   Not on file  Social History Narrative   Not on file   Social Determinants of Health   Financial Resource Strain: Low Risk  (01/19/2023)   [facility]   Overall Financial Resource Strain (CARDIA)    Difficulty of Paying Living Expenses: Not hard at all  Food Insecurity: No Food Insecurity (01/19/2023)   Received from [facility]    Hunger Vital Sign    Worried About Running Out of Food in the Last Year: Never true    Ran Out of Food in the Last Year: Never true  Transportation Needs: No Transportation Needs (01/19/2023)   Received from [facility], [facility]   PRAPARE - Transportation    Lack of Transportation (Medical): No    Lack of Transportation (Non-Medical): No  Physical Activity: Sufficiently Active (12/08/2022)   Exercise Vital Sign    Days of Exercise per Week: 3 days    Minutes of Exercise per Session: 60 min  Stress: No Stress Concern Present (12/08/2022)   [employer] of Occupational Health -  Occupational Stress Questionnaire    Feeling of Stress : Not at all  Social Connections: Moderately Isolated (12/08/2022)   Social Connection and Isolation Panel [NHANES]    Frequency of Communication with Friends and Family: More than three times a week    Frequency of Social Gatherings with Friends and Family: Twice a week    Attends Religious Services: More than 4 times per year    Active Member of [employer] or Organizations: No    Attends Banker Meetings: Never    Marital Status: Never married  Intimate Partner Violence: Not At Risk (12/08/2022)   Humiliation, Afraid, Rape, and Kick questionnaire    Fear of Current or Ex-Partner: No    Emotionally Abused: No    Physically Abused: No    Sexually Abused: No   Family History  Problem Relation Age of Onset   Stomach cancer Mother 65   Breast cancer Mother    Cancer Mother    Hypertension Mother    Stroke Mother    Lung cancer Father 61   Current Outpatient Medications on File Prior to Visit  Medication Sig   amLODipine (NORVASC) 5 MG tablet Take 1 tablet (5 mg total) by mouth daily.   busPIRone (BUSPAR) 5 MG tablet TAKE 1 TABLET BY MOUTH 3 TIMES A DAY AS NEEDED FOR ANXIETY   escitalopram (LEXAPRO) 20 MG tablet Take 1 tablet (20 mg total) by mouth at bedtime.   gabapentin (NEURONTIN) 300 MG capsule Take 2 capsules (600 mg total) by  mouth at bedtime.   triamcinolone ointment (KENALOG) 0.1 % Apply 1 Application topically 2 (two) times daily.   No current facility-administered medications on file prior to visit.    Review of Systems  Constitutional:  Negative for activity change, appetite change, chills, diaphoresis, fatigue and fever.  HENT:  Negative for congestion and hearing loss.   Eyes:  Negative for visual disturbance.  Respiratory:  Negative for cough, chest tightness, shortness of breath and wheezing.   Cardiovascular:  Negative for chest pain, palpitations and leg swelling.  Gastrointestinal:  Negative for abdominal pain, constipation, diarrhea, nausea and vomiting.  Genitourinary:  Negative for dysuria, frequency and hematuria.  Musculoskeletal:  Positive for arthralgias (L shoulder). Negative for neck pain.  Skin:  Negative for rash.  Neurological:  Negative for dizziness, weakness, light-headedness, numbness and headaches.  Hematological:  Negative for adenopathy.  Psychiatric/Behavioral:  Negative for behavioral problems, dysphoric mood and sleep disturbance.    Per HPI unless specifically indicated above      Objective:    BP (!) 120/58   Pulse 77   Temp (!) 96.6 F (35.9 C) (Temporal)   Ht 5\' 4"  (1.626 m)   Wt 194 lb 4.8 oz (88.1 kg)   SpO2 100%   BMI 33.35 kg/m   Wt Readings from Last 3 Encounters:  07/21/23 194 lb 4.8 oz (88.1 kg)  06/19/23 199 lb 6 oz (90.4 kg)  05/04/23 200 lb (90.7 kg)    Physical Exam Vitals and nursing note reviewed.  Constitutional:      General: She is not in acute distress.    Appearance: She is well-developed. She is not diaphoretic.     Comments: Well-appearing, comfortable, cooperative  HENT:     Head: Normocephalic and atraumatic.  Eyes:     General:        Right eye: No discharge.        Left eye: No discharge.     Conjunctiva/sclera: Conjunctivae normal.  Pupils: Pupils are equal, round, and reactive to light.  Neck:     Thyroid: No  thyromegaly.  Cardiovascular:     Rate and Rhythm: Normal rate and regular rhythm.     Pulses: Normal pulses.     Heart sounds: Normal heart sounds. No murmur heard. Pulmonary:     Effort: Pulmonary effort is normal. No respiratory distress.     Breath sounds: Normal breath sounds. No wheezing or rales.  Abdominal:     General: Bowel sounds are normal. There is no distension.     Palpations: Abdomen is soft. There is no mass.     Tenderness: There is no abdominal tenderness.  Musculoskeletal:        General: No tenderness.     Cervical back: Normal range of motion and neck supple.     Comments: L Shoulder reduced ROM active  Upper / Lower Extremities: - Normal muscle tone, strength bilateral upper extremities 5/5, lower extremities 5/5  Lymphadenopathy:     Cervical: No cervical adenopathy.  Skin:    General: Skin is warm and dry.     Findings: No erythema or rash.  Neurological:     Mental Status: She is alert and oriented to person, place, and time.     Comments: Distal sensation intact to light touch all extremities  Psychiatric:        Mood and Affect: Mood normal.        Behavior: Behavior normal.        Thought Content: Thought content normal.     Comments: Well groomed, good eye contact, normal speech and thoughts    Results for orders placed or performed in visit on 07/13/23  CBC with Differential/Platelet  Result Value Ref Range   WBC 5.0 3.8 - 10.8 Thousand/uL   RBC 4.24 3.80 - 5.10 Million/uL   Hemoglobin 13.5 11.7 - 15.5 g/dL   HCT 62.8 31.5 - 17.6 %   MCV 96.9 80.0 - 100.0 fL   MCH 31.8 27.0 - 33.0 pg   MCHC 32.8 32.0 - 36.0 g/dL   RDW 16.0 (H) 73.7 - 10.6 %   Platelets 197 140 - 400 Thousand/uL   MPV 11.2 7.5 - 12.5 fL   Neutro Abs 1,685 1,500 - 7,800 cells/uL   Lymphs Abs 2,775 850 - 3,900 cells/uL   Absolute Monocytes 460 200 - 950 cells/uL   Eosinophils Absolute 30 15 - 500 cells/uL   Basophils Absolute 50 0 - 200 cells/uL   Neutrophils Relative %  33.7 %   Total Lymphocyte 55.5 %   Monocytes Relative 9.2 %   Eosinophils Relative 0.6 %   Basophils Relative 1.0 %  Comprehensive metabolic panel  Result Value Ref Range   Glucose, Bld 117 (H) 65 - 99 mg/dL   BUN 10 7 - 25 mg/dL   Creat 2.69 4.85 - 4.62 mg/dL   BUN/Creatinine Ratio SEE NOTE: 6 - 22 (calc)   Sodium 140 135 - 146 mmol/L   Potassium 4.7 3.5 - 5.3 mmol/L   Chloride 105 98 - 110 mmol/L   CO2 28 20 - 32 mmol/L   Calcium 9.6 8.6 - 10.4 mg/dL   Total Protein 6.5 6.1 - 8.1 g/dL   Albumin 4.0 3.6 - 5.1 g/dL   Globulin 2.5 1.9 - 3.7 g/dL (calc)   AG Ratio 1.6 1.0 - 2.5 (calc)   Total Bilirubin 0.5 0.2 - 1.2 mg/dL   Alkaline phosphatase (APISO) 79 37 - 153 U/L   AST  11 10 - 35 U/L   ALT 6 6 - 29 U/L  Lipid panel  Result Value Ref Range   Cholesterol 204 (H) <200 mg/dL   HDL 60 > OR = 50 mg/dL   Triglycerides 161 <096 mg/dL   LDL Cholesterol (Calc) 121 (H) mg/dL (calc)   Total CHOL/HDL Ratio 3.4 <5.0 (calc)   Non-HDL Cholesterol (Calc) 144 (H) <130 mg/dL (calc)  TSH  Result Value Ref Range   TSH 0.75 0.40 - 4.50 mIU/L  Hemoglobin A1c  Result Value Ref Range   Hgb A1c MFr Bld 5.9 (H) <5.7 % of total Hgb   Mean Plasma Glucose 123 mg/dL   eAG (mmol/L) 6.8 mmol/L      Assessment & Plan:   Problem List Items Addressed This Visit     Centrilobular emphysema (HCC)    Stable without flare Known mild obstructive COPD identified on CT imaging / Spirometry S/p lung cancer Currently not on maintenance      Essential hypertension    Improved HTN control - Home BP readings reviewed  Complication CAD Off Metoprolol   Plan:  1. Continue current BP regimen Amlodipine 5mg  daily 2. Encourage improved lifestyle - low sodium diet, regular exercise 3. Continue monitor BP outside office, bring readings to next visit, if persistently >140/90 or new symptoms notify office sooner      Relevant Medications   rosuvastatin (CRESTOR) 5 MG tablet   GAD (generalized anxiety  disorder)    Chronic problem Mostly stable and controlled but feels SSRI less effective on for 15+ years in past Has some mild chronic depression co morbid Related to chronic health problems recent few years  Continue Escitalopram 20mg , Buspar      Major depressive disorder, recurrent, in partial remission (HCC)    See A&P for GAD Chronic recurrent mild partial remission On SSRI      Non-small cell carcinoma of lung (HCC)    Followed by multidisciplinary team ([facility] - with Oncology, Cardiothoracic Surgery) Currently without evidence of recurrence S/p chemotherapy, radiation, surgical resection RUL  Plan Continue surveillance      Obesity (BMI 30.0-34.9)   Pure hypercholesterolemia    Controlled on statin Known CAD  Plan: 1. Continue current meds - Rosuvastatin 5mg  every other night 2. Continue ASA 81mg  for primary ASCVD risk reduction 3. Encourage improved lifestyle - low carb/cholesterol, reduce portion size, continue improving regular exercise  Consider repatha in future if interested instead of Statin therapy.      Relevant Medications   rosuvastatin (CRESTOR) 5 MG tablet   Other Visit Diagnoses     Annual physical exam    -  Primary   Chronic bursitis of left shoulder       Allergic conjunctivitis of both eyes       Urticaria           Updated Health Maintenance information Reviewed recent lab results with patient Encouraged improvement to lifestyle with diet and exercise Goal of weight loss  Lung Cancer S/p 5 years in Jan 2025 She follows with Oncology currently  Allergic Rhinosinusitis/Conjunctivitis I recommend OTC Claritin 10mg  (or Loratadine) daily allergy anti histamine every day to help prevent, instead of benadryl PRN  L Shoulder, Chronic Bursitis Prior injection 2023, benefit but not long term resolution Discussion today, future follow up on this issue can return sooner, we can repeat subacromial bursa injection if she is  interested.   Meds ordered this encounter  Medications   rosuvastatin (CRESTOR) 5 MG  tablet    Sig: Take 1 tablet (5 mg total) by mouth every other day.    Dispense:  45 tablet    Refill:  3      Follow up plan: Return in about 6 months (around 01/21/2024) for Return sooner for Left Shoulder Injection AND 6 month PreDM A1c, Onc updates .  Saralyn Pilar, DO [facility] 07/21/2023, 8:14 AM

## 2023-07-21 NOTE — Patient Instructions (Addendum)
Thank you for coming to the office today.  Recent Labs    07/14/23 0755  HGBA1C 5.9*   Cholesterol improved  New rx Rosuvastatin 5mg , take WHOLE pill every other day  I recommend OTC Claritin 10mg  (or Loratadine) daily allergy anti histamine every day to help prevent.  We can follow-up with another injection in L Shoulder   Please schedule a Follow-up Appointment to: Return in about 6 months (around 01/21/2024) for Return sooner for Left Shoulder Injection AND 6 month PreDM A1c, Onc updates .  If you have any other questions or concerns, please feel free to call the office or send a message through MyChart. You may also schedule an earlier appointment if necessary.  Additionally, you may be receiving a survey about your experience at our office within a few days to 1 week by e-mail or mail. We value your feedback.  Saralyn Pilar, DO [facility], [place]

## 2023-07-24 ENCOUNTER — Ambulatory Visit: Payer: 59 | Admitting: Family Medicine

## 2023-07-24 ENCOUNTER — Encounter: Payer: Self-pay | Admitting: Family Medicine

## 2023-07-24 VITALS — BP 118/71 | HR 92 | Ht 64.0 in | Wt 192.0 lb

## 2023-07-24 DIAGNOSIS — M7552 Bursitis of left shoulder: Secondary | ICD-10-CM

## 2023-07-24 DIAGNOSIS — H6123 Impacted cerumen, bilateral: Secondary | ICD-10-CM | POA: Diagnosis not present

## 2023-07-24 MED ORDER — LIDOCAINE HCL (PF) 1 % IJ SOLN
4.0000 mL | Freq: Once | INTRAMUSCULAR | Status: AC
Start: 2023-07-24 — End: 2023-07-24
  Administered 2023-07-24: 4 mL

## 2023-07-24 MED ORDER — METHYLPREDNISOLONE ACETATE 40 MG/ML IJ SUSP
40.0000 mg | Freq: Once | INTRAMUSCULAR | Status: AC
Start: 2023-07-24 — End: 2023-07-24
  Administered 2023-07-24: 40 mg via INTRA_ARTICULAR

## 2023-07-24 NOTE — Patient Instructions (Addendum)
Thank you for coming to the office today.  You received a Left Shoulder Joint steroid injection today. - Lidocaine numbing medicine may ease the pain initially for a few hours until it wears off - As discussed, you may experience a "steroid flare" this evening or within 24-48 hours, anytime medicine is injected into an inflamed joint it can cause the pain to get worse temporarily - Everyone responds differently to these injections, it depends on the patient and the severity of the joint problem, it may provide anywhere from days to weeks, to months of relief. Ideal response is >6 months relief - Try to take it easy for next 1-2 days, avoid over activity and strain on joint (limit lifting for shoulder) - Recommend the following:   - For swelling - rest, compression sleeve / ACE wrap, elevation, and ice packs as needed for first few days   - For pain in future may use heating pad or moist heat as needed  You have thick impacted ear wax (cerumen) blocking ear canals and ear drums. This is the most likely cause of reduced hearing and ear pain and discomfort. - We were able to remove almost all of the ear wax with flushing in the office today   Recommend Debrox ear drops OTC  Avoid using Q-tips inside ears, as this can push wax deeper, but you can try to use rolled up kleenex as a wick to absorb fluid and wax as well.   Please schedule a Follow-up Appointment to: Return if symptoms worsen or fail to improve.  If you have any other questions or concerns, please feel free to call the office or send a message through MyChart. You may also schedule an earlier appointment if necessary.  Additionally, you may be receiving a survey about your experience at our office within a few days to 1 week by e-mail or mail. We value your feedback.  Saralyn Pilar, DO [facility], [place]

## 2023-07-24 NOTE — Progress Notes (Signed)
Subjective:    Patient ID: Cheryl Payne, female    DOB: 09/29/1966, 57 y.o.   MRN: [id number]  Cheryl Payne is a 57 y.o. female presenting on 07/24/2023 for Shoulder Pain (Left shoulder injection.)   HPI  Left Shoulder Chronic Pain Bursitis Previously treated in 07/2020 and 2022, has improved in past 1 year with steroid injection with good results. Last treated 03/11/22 for same problem, chronic bursitis, she improved but it did not resolve. Now returns 1 year later still with same issue. Left shoulder, worse at night. - She had a massage yesterday with good results. - She prefers to avoid surgery   She has tried other medications muscle relaxants. Limited improve Working with PT but not for shoulder yet. Request repeat injection now 1 year later Denies injury fall numbness tingling  Biteral Cerumen Impaction Tried bulb syringe at home without success  Admits fullness in both ears      07/24/2023    8:03 AM 07/21/2023    7:58 AM 02/10/2023    8:07 AM  Depression screen PHQ 2/9  Decreased Interest 1 2 1   Down, Depressed, Hopeless 1 1 0  PHQ - 2 Score 2 3 1   Altered sleeping 3 3 1   Tired, decreased energy 1 1 1   Change in appetite 1 1 0  Feeling bad or failure about yourself  0 0 0  Trouble concentrating 1 1 0  Moving slowly or fidgety/restless 0 0 0  Suicidal thoughts 0 0 0  PHQ-9 Score 8 9 3   Difficult doing work/chores Not difficult at all Not difficult at all Not difficult at all    Social History   Tobacco Use   Smoking status: Former    Current packs/day: 0.00    Average packs/day: 1 pack/day for 35.0 years (35.0 ttl pk-yrs)    Types: Cigarettes    Quit date: 01/29/2019    Years since quitting: 4.4   Smokeless tobacco: Former    Quit date: 06/04/2018  Vaping Use   Vaping status: Never Used  Substance Use Topics   Alcohol use: Not Currently    Comment: Dont drink anymore   Drug use: Not Currently    Types: Marijuana    Review of Systems Per HPI unless  specifically indicated above     Objective:    BP 118/71   Pulse 92   Ht 5\' 4"  (1.626 m)   Wt 192 lb (87.1 kg)   SpO2 97%   BMI 32.96 kg/m   Wt Readings from Last 3 Encounters:  07/24/23 192 lb (87.1 kg)  07/21/23 194 lb 4.8 oz (88.1 kg)  06/19/23 199 lb 6 oz (90.4 kg)    Physical Exam Vitals and nursing note reviewed.  Constitutional:      General: She is not in acute distress.    Appearance: Normal appearance. She is well-developed. She is not diaphoretic.     Comments: Well-appearing, comfortable, cooperative  HENT:     Head: Normocephalic and atraumatic.     Right Ear: There is impacted cerumen.     Left Ear: There is impacted cerumen.  Eyes:     General:        Right eye: No discharge.        Left eye: No discharge.     Conjunctiva/sclera: Conjunctivae normal.  Cardiovascular:     Rate and Rhythm: Normal rate.  Pulmonary:     Effort: Pulmonary effort is normal.  Musculoskeletal:     Comments: Left Shoulder  Reduced ROM forward flex abduct. Impingement POSITIVE. Negative rotator cuff weakness provoked.  Skin:    General: Skin is warm and dry.     Findings: No erythema or rash.  Neurological:     Mental Status: She is alert and oriented to person, place, and time.  Psychiatric:        Mood and Affect: Mood normal.        Behavior: Behavior normal.        Thought Content: Thought content normal.     Comments: Well groomed, good eye contact, normal speech and thoughts     ________________________________________________________ PROCEDURE NOTE Date: 07/24/23 Left Shoulder subacromial injection Discussed benefits and risks (including pain, bleeding, infection, steroid flare). Verbal consent given by patient. Medication:  1 cc Depo-medrol 40mg  and 4 cc Lidocaine 1% without epi Time Out taken  Landmarks identified. Area cleansed with alcohol wipes. Using 21 gauge and 1, 1/2 inch needle, Left subacromial bursa space was injected (with above listed medication) via  posterior approach cold spray used for superficial anesthetic. Sterile bandage placed. Patient tolerated procedure well without bleeding or paresthesias. No complications.   ________________________________________________________ PROCEDURE NOTE Date: 07/24/23 Bilateral Ear Lavage / Cerumen Removal Discussed benefits and risks (including pain / discomforts, dizziness, minor abrasion of ear canal). Verbal consent given by patient. Medication:  Ear Lavage Solution (warm water + hydrogen peroxide) Performed by Dr Althea Charon Several drops of carbamide peroxide placed in each ear, allowed to sit for few minutes. Ear lavage solution flushed into one ear at a time in attempt to dislodge and remove ear wax. Results were successful  Repeat Ear Exam: - Completely removed cerumen now, with clear ear canals and visible TMs clear and normal appearance.    Results for orders placed or performed in visit on 07/13/23  CBC with Differential/Platelet  Result Value Ref Range   WBC 5.0 3.8 - 10.8 Thousand/uL   RBC 4.24 3.80 - 5.10 Million/uL   Hemoglobin 13.5 11.7 - 15.5 g/dL   HCT 16.1 09.6 - 04.5 %   MCV 96.9 80.0 - 100.0 fL   MCH 31.8 27.0 - 33.0 pg   MCHC 32.8 32.0 - 36.0 g/dL   RDW 40.9 (H) 81.1 - 91.4 %   Platelets 197 140 - 400 Thousand/uL   MPV 11.2 7.5 - 12.5 fL   Neutro Abs 1,685 1,500 - 7,800 cells/uL   Lymphs Abs 2,775 850 - 3,900 cells/uL   Absolute Monocytes 460 200 - 950 cells/uL   Eosinophils Absolute 30 15 - 500 cells/uL   Basophils Absolute 50 0 - 200 cells/uL   Neutrophils Relative % 33.7 %   Total Lymphocyte 55.5 %   Monocytes Relative 9.2 %   Eosinophils Relative 0.6 %   Basophils Relative 1.0 %  Comprehensive metabolic panel  Result Value Ref Range   Glucose, Bld 117 (H) 65 - 99 mg/dL   BUN 10 7 - 25 mg/dL   Creat 7.82 9.56 - 2.13 mg/dL   BUN/Creatinine Ratio SEE NOTE: 6 - 22 (calc)   Sodium 140 135 - 146 mmol/L   Potassium 4.7 3.5 - 5.3 mmol/L   Chloride 105 98 -  110 mmol/L   CO2 28 20 - 32 mmol/L   Calcium 9.6 8.6 - 10.4 mg/dL   Total Protein 6.5 6.1 - 8.1 g/dL   Albumin 4.0 3.6 - 5.1 g/dL   Globulin 2.5 1.9 - 3.7 g/dL (calc)   AG Ratio 1.6 1.0 - 2.5 (calc)   Total Bilirubin 0.5  0.2 - 1.2 mg/dL   Alkaline phosphatase (APISO) 79 37 - 153 U/L   AST 11 10 - 35 U/L   ALT 6 6 - 29 U/L  Lipid panel  Result Value Ref Range   Cholesterol 204 (H) <200 mg/dL   HDL 60 > OR = 50 mg/dL   Triglycerides 606 <301 mg/dL   LDL Cholesterol (Calc) 121 (H) mg/dL (calc)   Total CHOL/HDL Ratio 3.4 <5.0 (calc)   Non-HDL Cholesterol (Calc) 144 (H) <130 mg/dL (calc)  TSH  Result Value Ref Range   TSH 0.75 0.40 - 4.50 mIU/L  Hemoglobin A1c  Result Value Ref Range   Hgb A1c MFr Bld 5.9 (H) <5.7 % of total Hgb   Mean Plasma Glucose 123 mg/dL   eAG (mmol/L) 6.8 mmol/L      Assessment & Plan:   Problem List Items Addressed This Visit   None Visit Diagnoses     Chronic bursitis of left shoulder    -  Primary   Relevant Medications   methylPREDNISolone acetate (DEPO-MEDROL) injection 40 mg (Completed) (Start on 07/24/2023  1:30 PM)   lidocaine (PF) (XYLOCAINE) 1 % injection 4 mL (Completed) (Start on 07/24/2023  1:30 PM)   Bilateral impacted cerumen           Consistent with chronic Left-shoulder bursitis vs rotator cuff tendinopathy with some reduced active ROM but without significant evidence of muscle tear (no weakness). No documented X-ray Improved w/ prior injection >1 year ago  Plan: Left shoulder subacromial steroid injection performed today, see procedure note for details.  Conservative care, Tylenol, RICE therapy, exercises F/u if not miproving  # Cerumen impacted  Plan: 1. Successful office ear lavage cerumen removal today, re-evaluated with clear ear canals and normal TMs 2. Counseled on avoiding Q-tips and may use Kleenex as wick, use OTC Debrox as needed 3. Follow-up as needed  No orders of the defined types were placed in this  encounter.     Meds ordered this encounter  Medications   methylPREDNISolone acetate (DEPO-MEDROL) injection 40 mg   lidocaine (PF) (XYLOCAINE) 1 % injection 4 mL      Follow up plan: Return if symptoms worsen or fail to improve.  Saralyn Pilar, DO [facility] 07/24/2023, 8:11 AM

## 2023-07-28 ENCOUNTER — Ambulatory Visit (INDEPENDENT_AMBULATORY_CARE_PROVIDER_SITE_OTHER): Payer: 59

## 2023-07-28 DIAGNOSIS — Z0000 Encounter for general adult medical examination without abnormal findings: Secondary | ICD-10-CM | POA: Diagnosis not present

## 2023-07-28 NOTE — Patient Instructions (Signed)
Cheryl Payne , Thank you for taking time to come for your Medicare Wellness Visit. I appreciate your ongoing commitment to your health goals. Please review the following plan we discussed and let me know if I can assist you in the future.   Referrals/Orders/Follow-Ups/Clinician Recommendations: none  This is a list of the screening recommended for you and due dates:  Health Maintenance  Topic Date Due   DTaP/Tdap/Td vaccine (1 - Tdap) Never done   Flu Shot  07/06/2023   Pap Smear  07/30/2023   COVID-19 Vaccine (7 - 2023-24 season) 08/06/2023*   Zoster (Shingles) Vaccine (2 of 2) 10/21/2023*   Medicare Annual Wellness Visit  07/27/2024   Mammogram  01/04/2025   Colon Cancer Screening  03/23/2026   Hepatitis C Screening  Completed   HIV Screening  Completed   HPV Vaccine  Aged Out   Cologuard (Stool DNA test)  Discontinued  *Topic was postponed. The date shown is not the original due date.    Advanced directives: (ACP Link)Information on Advanced Care Planning can be found at [facility] Directives Advance Health Care Directives ([email or web address]   Next Medicare Annual Wellness Visit scheduled for next year: Yes   08/01/24 @ 3:00 pm by phone

## 2023-07-28 NOTE — Progress Notes (Signed)
Subjective:   Cheryl Payne is a 57 y.o. female who presents for Medicare Annual (Subsequent) preventive examination.  Visit Complete: Virtual  I connected with  Cheryl Payne on 07/28/23 by a audio enabled telemedicine application and verified that I am speaking with the correct person using two identifiers.  Patient Location: Home  Provider Location: Office/Clinic  I discussed the limitations of evaluation and management by telemedicine. The patient expressed understanding and agreed to proceed.  Vital Signs: Unable to obtain new vitals due to this being a telehealth visit.  Review of Systems     Cardiac Risk Factors include: advanced age (>25men, >48 women);hypertension;sedentary lifestyle;obesity (BMI >30kg/m2)     Objective:    There were no vitals filed for this visit. There is no height or weight on file to calculate BMI.     07/28/2023    8:34 AM 03/24/2023    9:27 AM 08/17/2022    8:31 AM 08/16/2022   10:16 AM 07/15/2022    9:09 AM 06/14/2021   10:58 AM  Advanced Directives  Does Patient Have a Medical Advance Directive? No No No No No No  Would patient like information on creating a medical advance directive? No - Patient declined No - Patient declined No - Patient declined No - Patient declined No - Patient declined No - Patient declined    Current Medications (verified) Outpatient Encounter Medications as of 07/28/2023  Medication Sig   amLODipine (NORVASC) 5 MG tablet Take 1 tablet (5 mg total) by mouth daily.   busPIRone (BUSPAR) 5 MG tablet TAKE 1 TABLET BY MOUTH 3 TIMES A DAY AS NEEDED FOR ANXIETY   escitalopram (LEXAPRO) 20 MG tablet Take 1 tablet (20 mg total) by mouth at bedtime.   gabapentin (NEURONTIN) 300 MG capsule Take 2 capsules (600 mg total) by mouth at bedtime.   rosuvastatin (CRESTOR) 5 MG tablet Take 1 tablet (5 mg total) by mouth every other day.   [DISCONTINUED] triamcinolone ointment (KENALOG) 0.1 % Apply 1 Application topically 2 (two)  times daily.   No facility-administered encounter medications on file as of 07/28/2023.    Allergies (verified) Patient has no known allergies.   History: Past Medical History:  Diagnosis Date   Anxiety NA   Arthritis    Blood transfusion without reported diagnosis 2023   When i was in the hospital   Cancer [facility])    lung   Chronic left shoulder pain    Depression    GERD (gastroesophageal reflux disease)    Hypertension    Ulcer 2023   When i was in the hospital   Past Surgical History:  Procedure Laterality Date   COLONOSCOPY WITH PROPOFOL N/A 03/24/2023   Procedure: COLONOSCOPY WITH PROPOFOL;  Surgeon: Wyline Mood, MD;  Location: [facility] ENDOSCOPY;  Service: Gastroenterology;  Laterality: N/A;   ESOPHAGOGASTRODUODENOSCOPY (EGD) WITH PROPOFOL N/A 08/17/2022   Procedure: ESOPHAGOGASTRODUODENOSCOPY (EGD) WITH PROPOFOL;  Surgeon: Wyline Mood, MD;  Location: [facility] ENDOSCOPY;  Service: Gastroenterology;  Laterality: N/A;   ESOPHAGOGASTRODUODENOSCOPY (EGD) WITH PROPOFOL N/A 03/24/2023   Procedure: ESOPHAGOGASTRODUODENOSCOPY (EGD) WITH PROPOFOL;  Surgeon: Wyline Mood, MD;  Location[facility] ENDOSCOPY;  Service: Gastroenterology;  Laterality: N/A;   LUNG SURGERY     Family History  Problem Relation Age of Onset   Stomach cancer Mother 27   Breast cancer Mother    Cancer Mother    Hypertension Mother    Stroke Mother    Lung cancer Father 20   Social History   Socioeconomic History  Marital status: Single    Spouse name: Not on file   Number of children: Not on file   Years of education: Not on file   Highest education level: 10th grade  Occupational History   Not on file  Tobacco Use   Smoking status: Former    Current packs/day: 0.00    Average packs/day: 1 pack/day for 35.0 years (35.0 ttl pk-yrs)    Types: Cigarettes    Quit date: 01/29/2019    Years since quitting: 4.4   Smokeless tobacco: Former    Quit date: 06/04/2018  Vaping Use   Vaping status: Never Used   Substance and Sexual Activity   Alcohol use: Not Currently    Comment: Dont drink anymore   Drug use: Not Currently    Types: Marijuana   Sexual activity: Not Currently    Birth control/protection: None  Other Topics Concern   Not on file  Social History Narrative   Not on file   Social Determinants of Health   Financial Resource Strain: Low Risk  (07/28/2023)   Overall Financial Resource Strain (CARDIA)    Difficulty of Paying Living Expenses: Not hard at all  Food Insecurity: No Food Insecurity (07/28/2023)   Hunger Vital Sign    Worried About Running Out of Food in the Last Year: Never true    Ran Out of Food in the Last Year: Never true  Transportation Needs: No Transportation Needs (07/28/2023)   PRAPARE - Administrator, Civil Service (Medical): No    Lack of Transportation (Non-Medical): No  Physical Activity: Inactive (07/28/2023)   Exercise Vital Sign    Days of Exercise per Week: 0 days    Minutes of Exercise per Session: 0 min  Stress: No Stress Concern Present (07/28/2023)   [employer] of Occupational Health - Occupational Stress Questionnaire    Feeling of Stress : Only a little  Recent Concern: Stress - Stress Concern Present (07/23/2023)   [employer] of Occupational Health - Occupational Stress Questionnaire    Feeling of Stress : To some extent  Social Connections: Moderately Isolated (07/28/2023)   Social Connection and Isolation Panel [NHANES]    Frequency of Communication with Friends and Family: More than three times a week    Frequency of Social Gatherings with Friends and Family: More than three times a week    Attends Religious Services: More than 4 times per year    Active Member of [employer] or Organizations: No    Attends Engineer, structural: Never    Marital Status: Never married    Tobacco Counseling Counseling given: Not Answered   Clinical Intake:  Pre-visit preparation completed: Yes  Pain : No/denies  pain     Nutritional Risks: None Diabetes: No  How often do you need to have someone help you when you read instructions, pamphlets, or other written materials from your doctor or pharmacy?: 1 - Never  Interpreter Needed?: No  Information entered by :: Kennedy Bucker, LPN   Activities of Daily Living    07/28/2023    8:35 AM 07/24/2023    8:04 AM  In your present state of health, do you have any difficulty performing the following activities:  Hearing? 0 0  Vision? 0 0  Difficulty concentrating or making decisions? 0 0  Walking or climbing stairs? 0 0  Dressing or bathing? 0 0  Doing errands, shopping? 0 0  Preparing Food and eating ? N   Using the Toilet? N  In the past six months, have you accidently leaked urine? N   Do you have problems with loss of bowel control? N   Managing your Medications? N   Managing your Finances? N   Housekeeping or managing your Housekeeping? N     Patient Care Team: [name], DO as PCP - General (Family Medicine) Marlowe Sax, RN as Registered Nurse (General Practice)  Indicate any recent Medical Services you may have received from other than [facility] providers in the past year (date may be approximate).     Assessment:   This is a routine wellness examination for Cheryl Payne.  Hearing/Vision screen Hearing Screening - Comments:: No aids Vision Screening - Comments:: Wear glasses- [facility] on [address]   Dietary issues and exercise activities discussed:     Goals Addressed             This Visit's Progress    DIET - INCREASE WATER INTAKE         Depression Screen    07/28/2023    8:33 AM 07/24/2023    8:03 AM 07/21/2023    7:58 AM 02/10/2023    8:07 AM 12/02/2022    8:08 AM 07/15/2022    9:07 AM 03/31/2022   10:06 AM  PHQ 2/9 Scores  PHQ - 2 Score 2 2 3 1 2 1 1   PHQ- 9 Score 4 8 9 3 6 2      Fall Risk    07/28/2023    8:35 AM 07/24/2023    8:03 AM 07/21/2023    7:58 AM 06/27/2023    8:56 AM 02/10/2023     8:07 AM  Fall Risk   Falls in the past year? 0 0 0 0 0  Number falls in past yr: 0 0 0 0 0  Injury with Fall? 0 0 0 0 0  Risk for fall due to : No Fall Risks No Fall Risks No Fall Risks No Fall Risks No Fall Risks  Follow up Falls prevention discussed;Falls evaluation completed Falls evaluation completed Falls prevention discussed;Education provided;Falls evaluation completed Falls evaluation completed;Education provided Falls evaluation completed    MEDICARE RISK AT HOME: Medicare Risk at Home Any stairs in or around the home?: No If so, are there any without handrails?: No Home free of loose throw rugs in walkways, pet beds, electrical cords, etc?: Yes Adequate lighting in your home to reduce risk of falls?: Yes Life alert?: No Use of a cane, walker or w/c?: No Grab bars in the bathroom?: Yes Shower chair or bench in shower?: No Elevated toilet seat or a handicapped toilet?: No  TIMED UP AND GO:  Was the test performed?  No    Cognitive Function:        07/28/2023    8:36 AM 07/15/2022    9:11 AM  6CIT Screen  What Year? 0 points 0 points  What month? 0 points 0 points  What time? 0 points 0 points  Count back from 20 0 points 0 points  Months in reverse 0 points 0 points  Repeat phrase 2 points 0 points  Total Score 2 points 0 points    Immunizations Immunization History  Administered Date(s) Administered   Covid-19, Mrna,Vaccine(Spikevax)12yrs and older 01/19/2023   Influenza,inj,Quad PF,6+ Mos 08/24/2021   Moderna Sars-Covid-2 Vaccination 03/09/2021   PFIZER(Purple Top)SARS-COV-2 Vaccination 02/21/2020, 03/13/2020, 09/21/2020, 03/09/2021   Zoster Recombinant(Shingrix) 12/02/2022    TDAP status: Due, Education has been provided regarding the importance of this vaccine. Advised may  receive this vaccine at local pharmacy or Health Dept. Aware to provide a copy of the vaccination record if obtained from local pharmacy or Health Dept. Verbalized acceptance and  understanding.  Flu Vaccine status: Declined, Education has been provided regarding the importance of this vaccine but patient still declined. Advised may receive this vaccine at local pharmacy or Health Dept. Aware to provide a copy of the vaccination record if obtained from local pharmacy or Health Dept. Verbalized acceptance and understanding.  Pneumococcal vaccine status: Declined,  Education has been provided regarding the importance of this vaccine but patient still declined. Advised may receive this vaccine at local pharmacy or Health Dept. Aware to provide a copy of the vaccination record if obtained from local pharmacy or Health Dept. Verbalized acceptance and understanding.   Covid-19 vaccine status: Completed vaccines  Qualifies for Shingles Vaccine? Yes   Zostavax completed No   Shingrix Completed?: Yes  Screening Tests Health Maintenance  Topic Date Due   DTaP/Tdap/Td (1 - Tdap) Never done   INFLUENZA VACCINE  07/06/2023   PAP SMEAR-Modifier  07/30/2023   COVID-19 Vaccine (7 - 2023-24 season) 08/06/2023 (Originally 03/16/2023)   Zoster Vaccines- Shingrix (2 of 2) 10/21/2023 (Originally 01/27/2023)   Medicare Annual Wellness (AWV)  07/27/2024   MAMMOGRAM  01/04/2025   Colonoscopy  03/23/2026   Hepatitis C Screening  Completed   HIV Screening  Completed   HPV VACCINES  Aged Out   Fecal DNA (Cologuard)  Discontinued    Health Maintenance  Health Maintenance Due  Topic Date Due   DTaP/Tdap/Td (1 - Tdap) Never done   INFLUENZA VACCINE  07/06/2023   PAP SMEAR-Modifier  07/30/2023    Colorectal cancer screening: Type of screening: Colonoscopy. Completed 03/24/23. Repeat every 3 years  Mammogram status: Completed 01/04/23. Repeat every year  Lung Cancer Screening: (Low Dose CT Chest recommended if Age 13-80 years, 20 pack-year currently smoking OR have quit w/in 15years.) does not qualify.    Additional Screening:  Hepatitis C Screening: does qualify; Completed  08/13/20  Vision Screening: Recommended annual ophthalmology exams for early detection of glaucoma and other disorders of the eye. Is the patient up to date with their annual eye exam?  Yes  Who is the provider or what is the name of the office in which the patient attends annual eye exams? [facility]  If pt is not established with a provider, would they like to be referred to a provider to establish care? No .   Dental Screening: Recommended annual dental exams for proper oral hygiene    Community Resource Referral / Chronic Care Management: CRR required this visit?  No   CCM required this visit?  No     Plan:     I have personally reviewed and noted the following in the patient's chart:   Medical and social history Use of alcohol, tobacco or illicit drugs  Current medications and supplements including opioid prescriptions. Patient is not currently taking opioid prescriptions. Functional ability and status Nutritional status Physical activity Advanced directives List of other physicians Hospitalizations, surgeries, and ER visits in previous 12 months Vitals Screenings to include cognitive, depression, and falls Referrals and appointments  In addition, I have reviewed and discussed with patient certain preventive protocols, quality metrics, and best practice recommendations. A written personalized care plan for preventive services as well as general preventive health recommendations were provided to patient.     Hal Hope, LPN   [id number]/[id number]   After Visit Summary: (MyChart) Due  to this being a telephonic visit, the after visit summary with patients personalized plan was offered to patient via MyChart   Nurse Notes: none

## 2023-08-01 DIAGNOSIS — J069 Acute upper respiratory infection, unspecified: Secondary | ICD-10-CM | POA: Diagnosis not present

## 2023-08-01 DIAGNOSIS — Z1152 Encounter for screening for COVID-19: Secondary | ICD-10-CM | POA: Diagnosis not present

## 2023-08-01 DIAGNOSIS — R0981 Nasal congestion: Secondary | ICD-10-CM | POA: Diagnosis not present

## 2023-08-11 ENCOUNTER — Other Ambulatory Visit: Payer: 59

## 2023-08-18 ENCOUNTER — Encounter: Payer: 59 | Admitting: Family Medicine

## 2023-08-22 ENCOUNTER — Telehealth: Payer: Self-pay

## 2023-08-22 ENCOUNTER — Other Ambulatory Visit: Payer: Self-pay

## 2023-08-22 ENCOUNTER — Other Ambulatory Visit: Payer: 59

## 2023-08-22 NOTE — Patient Outreach (Signed)
Care Management   Follow Up Note   08/22/2023 Name: Cheryl Payne MRN: [id number] DOB: 1966-11-18   Referred by: [name], DO Reason for referral : Care Management (RNCM: Follow up for Chronic Disease Management and Care Coordination Needs- attempt)   An unsuccessful telephone outreach was attempted today. The patient was referred to the case management team for assistance with care management and care coordination.   Follow Up Plan: A HIPPA compliant phone message was left for the patient providing contact information and requesting a return call.   Alto Denver RN, MSN, CCM RN Care Manager  [facility]  Ambulatory Care Management  Direct Number: [phone]

## 2023-08-22 NOTE — Patient Outreach (Addendum)
Care Management   Visit Note  08/22/2023 Name: Cheryl Payne MRN: [id number] DOB: October 10, 1966  Subjective: Cheryl Payne is a 57 y.o. year old female who is a primary care patient of [name], DO. The Care Management team was consulted for assistance.      Engaged with patient spoke with patient by telephone.    Goals Addressed             This Visit's Progress    RNCM Care Management Expected Outcome:  Monitor, Self-Manage and Reduce Symptoms of: Pre-Diabetes       Current Barriers:  Knowledge Deficits related to dietary changes to help with managing pre-DM Chronic Disease Management support and education needs related to effective management of pre-DM with hospitalization for GI bleed in September  Lab Results  Component Value Date   HGBA1C 5.9 (H) 07/14/2023     Planned Interventions: Provided education to patient about basic DM disease process. The patient denies any issues with her DM. Knows that she needs to have new blood work. Reminder provided. A1C is stable. Denies any changes in her DM health and well being.  Reviewed prescribed diet with patient heart healthy/ADA diet. Review of dietary restrictions. The patient is mindful of what she is eating especially since she has ulcers.; Counseled on importance of regular laboratory monitoring as prescribed. Labs are up to date. Has good control of her DM Discussed plans with patient for ongoing care management follow up and provided patient with direct contact information for care management team;      Provided patient with written educational materials related to hypo and hyperglycemia and importance of correct treatment. Review of the signs and symptoms of hypo and hyperglycemia.        Reviewed scheduled/upcoming provider appointments including: 01-25-2024 at 08 am        call provider for findings outside established parameters;       Review of patient status, including review of consultants reports,  relevant laboratory and other test results, and medications completed;       Advised patient to discuss changes that would warrant taking medications for DM, questions or concerns related to effective management of DM with provider;      The patient was in the hospital for a GI bleed in September. The patient states her GI symptoms are better.  The patient states her GI symptoms are stable. The patient had hemorrhoid banding in April. She is doing well. Denies any acute changes.  Saw the GI specialist last week. She is doing well. She had to have the procedure 4 times for the banding to be successful. Had a CT scan with no acute findings. Will continue to monitor.  Incoming call from the patient today asking for assistance with getting hermedications changed over to a different pharmacy. Her pharmacy closed down. Collaboration with the pharm D and pcp asking for assistance with requested needs. Has her medications. Likes the way Karin Golden does her medications. Denies any new needs or changes in her medications.   Symptom Management: Attend all scheduled provider appointments Call provider office for new concerns or questions  call the Suicide and Crisis Lifeline: 988 call the [place] National Suicide Prevention Lifeline: (919)740-2490 or TTY: 662-185-4559 TTY (623) 579-3660) to talk to a trained counselor call 1-800-273-TALK (toll free, 24 hour hotline) if experiencing a Mental Health or Behavioral Health Crisis  check feet daily for cuts, sores or redness trim toenails straight across manage portion size wash and dry feet carefully  every day wear comfortable, cotton socks wear comfortable, well-fitting shoes  Follow Up Plan: Telephone follow up appointment with care management team member scheduled for: 10-24-2023 at 945 am       RNCM Care Management Expected Outcome:  Monitor, Self-Manage, and Reduce Symptoms of Hypertension       Current Barriers:  Knowledge Deficits related to controlling  blood pressures and monitoring for changes in blood pressure Care Coordination needs related to knowing how to effectively manage blood pressures and calling the office for changes in blood pressures and when to take medications and when to hold in a patient with HTN Chronic Disease Management support and education needs related to effective management of HTN  BP Readings from Last 3 Encounters:  07/24/23 118/71  07/21/23 (!) 120/58  06/19/23 120/74     Planned Interventions: Evaluation of current treatment plan related to hypertension self management and patient's adherence to plan as established by provider. The patient has stable blood pressures, no acute changes noted;  The patient states she was going to start taking her blood pressures and write them down but she has not yet. Encouraged her to take if she has a headache or dizziness to see what range her blood pressures may be in. Education and support provided. Will continue to monitor for changes. Provided education to patient re: stroke prevention, s/s of heart attack and stroke; Reviewed prescribed diet heart healthy diet, avoiding spicy foods due to recent GI Bleed. GI sx and sx have improved and the patient is using Peppermint capsules per directions of the pcp. The patient is compliant with heart healthy diet and monitoring her dietary intake. Education provided.  Reviewed medications with patient and discussed importance of compliance. The patient is compliant with medications. The patient has all of her medications switched over and denies any issues with getting her medications at this time. Discussed plans with patient for ongoing care management follow up and provided patient with direct contact information for care management team; Advised patient, providing education and rationale, to monitor blood pressure daily and record, calling PCP for findings outside established parameters;  Reviewed scheduled/upcoming provider  appointments including:  next appointment on 07-21-2023. Knows to call for changes or new needs. Advised patient to discuss changes in blood pressures that warrant changes in medications or plan of care with provider; Provided education on prescribed diet heart healthy diet, avoiding spicy foods;  Discussed complications of poorly controlled blood pressure such as heart disease, stroke, circulatory complications, vision complications, kidney impairment, sexual dysfunction;  Screening for signs and symptoms of depression related to chronic disease state;  Assessed social determinant of health barriers;  The patient is wanting to get her preventative health done. The patient received a letter about her mammogram. Answered questions and assisted with scheduling of her mammogram locally.  Her biggest concern right now is pain and discomfort in her left shoulder. She had an injection and it helped for a couple of weeks but it is still pretty rough sometimes. Encouraged her to reach out to the pcp for recommendations and further treatment options. Will continue to monitor.   Symptom Management: Take medications as prescribed   Attend all scheduled provider appointments Call pharmacy for medication refills 3-7 days in advance of running out of medications Call provider office for new concerns or questions  call the Suicide and Crisis Lifeline: 988 call the [place] National Suicide Prevention Lifeline: (707)370-4923 or TTY: 337-275-8175 TTY (267)253-6968) to talk to a trained counselor call 1-800-273-TALK (toll  free, 24 hour hotline) if experiencing a Mental Health or Behavioral Health Crisis   Follow Up Plan: Telephone follow up appointment with care management team member scheduled for: 10-24-2023 at 0945 am       RNCM Care Management:   Maintain, Monitor and Self-Manage Symptoms of COPD       Current Barriers:  Knowledge Deficits related to triggers and factors that may cause COPD  Exacerbation Chronic Disease Management support and education needs related to COPD  Planned Interventions: Provided patient with basic written and verbal COPD education on self care/management/and exacerbation prevention. The patient denies any acute changes in her COPD. She had been having a lot of congestion in her head. Under the recommendation of the pharmacist she got some Flonase and she is taking that and it is really helpful. Denies any acute changes in her breathing or COPD today.   Advised patient to track and manage COPD triggers. Review of changes in weather and monitoring for others around her being sick, education on staying safe and protecting self during peak sickness times. Is monitoring for changes in her breathing. Denies any acute changes in her COPD. Stays out of the heat.   Provided  verbal instructions on pursed lip breathing and utilized returned demonstration as teach back Advised patient to self assesses COPD action plan zone and make appointment with provider if in the yellow zone for 48 hours without improvement. Review of monitoring for changes and taking medications as directed. Discussed calling the office for changes or new concerns with COPD and effective management of COPD. Has her medications. Denies any new needs with medications. Likes getting her medications from [employer]. Advised patient to engage in light exercise as tolerated 3-5 days a week to aid in the the management of COPD Provided education about and advised patient to utilize infection prevention strategies to reduce risk of respiratory infection. Review of risk factors Discussed the importance of adequate rest and management of fatigue with COPD Screening for signs and symptoms of depression related to chronic disease state  Assessed social determinant of health barriers She has gotten her medications changed over to her new pharmacy and denies any new medication needs at this time. Will continue to  monitor.   Symptom Management: Attend all scheduled provider appointments Call provider office for new concerns or questions  call the Suicide and Crisis Lifeline: 988 call the [place] National Suicide Prevention Lifeline: 531-803-5392 or TTY: 6613917365 TTY (872)370-6736) to talk to a trained counselor call 1-800-273-TALK (toll free, 24 hour hotline) if experiencing a Mental Health or Behavioral Health Crisis  avoid second hand smoke eliminate smoking in my home identify and remove indoor air pollutants limit outdoor activity during cold weather listen for public air quality announcements every day develop a rescue plan eliminate symptom triggers at home follow rescue plan if symptoms flare-up develop a new routine to improve sleep get at least 7 to 8 hours of sleep at night  Follow Up Plan: Telephone follow up appointment with care management team member scheduled for: 10-24-2023 at 945 am           Consent to Services:  Patient was given information about care management services, agreed to services, and gave verbal consent to participate.   Plan: Telephone follow up appointment with care management team member scheduled for: 09-29-2023 at 0945 am  Cheryl Denver RN, MSN, CCM RN Care Manager  [facility]  Ambulatory Care Management  Direct Number: [phone]

## 2023-08-22 NOTE — Telephone Encounter (Signed)
Pt is calling in returning a call from [facility]

## 2023-08-22 NOTE — Patient Instructions (Signed)
Visit Information  Thank you for taking time to visit with me today. Please don't hesitate to contact me if I can be of assistance to you before our next scheduled telephone appointment.  Following are the goals we discussed today:   Goals Addressed             This Visit's Progress    RNCM Care Management Expected Outcome:  Monitor, Self-Manage and Reduce Symptoms of: Pre-Diabetes       Current Barriers:  Knowledge Deficits related to dietary changes to help with managing pre-DM Chronic Disease Management support and education needs related to effective management of pre-DM with hospitalization for GI bleed in September  Lab Results  Component Value Date   HGBA1C 5.9 (H) 07/14/2023     Planned Interventions: Provided education to patient about basic DM disease process. The patient denies any issues with her DM. Knows that she needs to have new blood work. Reminder provided. A1C is stable. Denies any changes in her DM health and well being.  Reviewed prescribed diet with patient heart healthy/ADA diet. Review of dietary restrictions. The patient is mindful of what she is eating especially since she has ulcers.; Counseled on importance of regular laboratory monitoring as prescribed. Labs are up to date. Has good control of her DM Discussed plans with patient for ongoing care management follow up and provided patient with direct contact information for care management team;      Provided patient with written educational materials related to hypo and hyperglycemia and importance of correct treatment. Review of the signs and symptoms of hypo and hyperglycemia.        Reviewed scheduled/upcoming provider appointments including: 01-25-2024 at 08 am        call provider for findings outside established parameters;       Review of patient status, including review of consultants reports, relevant laboratory and other test results, and medications completed;       Advised patient to discuss  changes that would warrant taking medications for DM, questions or concerns related to effective management of DM with provider;      The patient was in the hospital for a GI bleed in September. The patient states her GI symptoms are better.  The patient states her GI symptoms are stable. The patient had hemorrhoid banding in April. She is doing well. Denies any acute changes.  Saw the GI specialist last week. She is doing well. She had to have the procedure 4 times for the banding to be successful. Had a CT scan with no acute findings. Will continue to monitor.  Incoming call from the patient today asking for assistance with getting hermedications changed over to a different pharmacy. Her pharmacy closed down. Collaboration with the pharm D and pcp asking for assistance with requested needs. Has her medications. Likes the way Karin Golden does her medications. Denies any new needs or changes in her medications.   Symptom Management: Attend all scheduled provider appointments Call provider office for new concerns or questions  call the Suicide and Crisis Lifeline: 988 call the [place] National Suicide Prevention Lifeline: 304-658-9140 or TTY: 386-619-8419 TTY 479-797-9213) to talk to a trained counselor call 1-800-273-TALK (toll free, 24 hour hotline) if experiencing a Mental Health or Behavioral Health Crisis  check feet daily for cuts, sores or redness trim toenails straight across manage portion size wash and dry feet carefully every day wear comfortable, cotton socks wear comfortable, well-fitting shoes  Follow Up Plan: Telephone follow up appointment  with care management team member scheduled for: 10-24-2023 at 945 am       RNCM Care Management Expected Outcome:  Monitor, Self-Manage, and Reduce Symptoms of Hypertension       Current Barriers:  Knowledge Deficits related to controlling blood pressures and monitoring for changes in blood pressure Care Coordination needs related to knowing  how to effectively manage blood pressures and calling the office for changes in blood pressures and when to take medications and when to hold in a patient with HTN Chronic Disease Management support and education needs related to effective management of HTN  BP Readings from Last 3 Encounters:  07/24/23 118/71  07/21/23 (!) 120/58  06/19/23 120/74     Planned Interventions: Evaluation of current treatment plan related to hypertension self management and patient's adherence to plan as established by provider. The patient has stable blood pressures, no acute changes noted;  The patient states she was going to start taking her blood pressures and write them down but she has not yet. Encouraged her to take if she has a headache or dizziness to see what range her blood pressures may be in. Education and support provided. Will continue to monitor for changes. Provided education to patient re: stroke prevention, s/s of heart attack and stroke; Reviewed prescribed diet heart healthy diet, avoiding spicy foods due to recent GI Bleed. GI sx and sx have improved and the patient is using Peppermint capsules per directions of the pcp. The patient is compliant with heart healthy diet and monitoring her dietary intake. Education provided.  Reviewed medications with patient and discussed importance of compliance. The patient is compliant with medications. The patient has all of her medications switched over and denies any issues with getting her medications at this time. Discussed plans with patient for ongoing care management follow up and provided patient with direct contact information for care management team; Advised patient, providing education and rationale, to monitor blood pressure daily and record, calling PCP for findings outside established parameters;  Reviewed scheduled/upcoming provider appointments including:  next appointment on 07-21-2023. Knows to call for changes or new needs. Advised patient to  discuss changes in blood pressures that warrant changes in medications or plan of care with provider; Provided education on prescribed diet heart healthy diet, avoiding spicy foods;  Discussed complications of poorly controlled blood pressure such as heart disease, stroke, circulatory complications, vision complications, kidney impairment, sexual dysfunction;  Screening for signs and symptoms of depression related to chronic disease state;  Assessed social determinant of health barriers;  The patient is wanting to get her preventative health done. The patient received a letter about her mammogram. Answered questions and assisted with scheduling of her mammogram locally.  Her biggest concern right now is pain and discomfort in her left shoulder. She had an injection and it helped for a couple of weeks but it is still pretty rough sometimes. Encouraged her to reach out to the pcp for recommendations and further treatment options. Will continue to monitor.   Symptom Management: Take medications as prescribed   Attend all scheduled provider appointments Call pharmacy for medication refills 3-7 days in advance of running out of medications Call provider office for new concerns or questions  call the Suicide and Crisis Lifeline: 988 call the [place] National Suicide Prevention Lifeline: 325 524 9625 or TTY: 8072368300 TTY (604)737-8253) to talk to a trained counselor call 1-800-273-TALK (toll free, 24 hour hotline) if experiencing a Mental Health or Behavioral Health Crisis   Follow Up Plan:  Telephone follow up appointment with care management team member scheduled for: 10-24-2023 at 0945 am       RNCM Care Management:   Maintain, Monitor and Self-Manage Symptoms of COPD       Current Barriers:  Knowledge Deficits related to triggers and factors that may cause COPD Exacerbation Chronic Disease Management support and education needs related to COPD  Planned Interventions: Provided patient with  basic written and verbal COPD education on self care/management/and exacerbation prevention. The patient denies any acute changes in her COPD. She had been having a lot of congestion in her head. Under the recommendation of the pharmacist she got some Flonase and she is taking that and it is really helpful. Denies any acute changes in her breathing or COPD today.   Advised patient to track and manage COPD triggers. Review of changes in weather and monitoring for others around her being sick, education on staying safe and protecting self during peak sickness times. Is monitoring for changes in her breathing. Denies any acute changes in her COPD. Stays out of the heat.   Provided  verbal instructions on pursed lip breathing and utilized returned demonstration as teach back Advised patient to self assesses COPD action plan zone and make appointment with provider if in the yellow zone for 48 hours without improvement. Review of monitoring for changes and taking medications as directed. Discussed calling the office for changes or new concerns with COPD and effective management of COPD. Has her medications. Denies any new needs with medications. Likes getting her medications from [employer]. Advised patient to engage in light exercise as tolerated 3-5 days a week to aid in the the management of COPD Provided education about and advised patient to utilize infection prevention strategies to reduce risk of respiratory infection. Review of risk factors Discussed the importance of adequate rest and management of fatigue with COPD Screening for signs and symptoms of depression related to chronic disease state  Assessed social determinant of health barriers She has gotten her medications changed over to her new pharmacy and denies any new medication needs at this time. Will continue to monitor.   Symptom Management: Attend all scheduled provider appointments Call provider office for new concerns or questions   call the Suicide and Crisis Lifeline: 988 call the [place] National Suicide Prevention Lifeline: 812 511 9126 or TTY: (581) 058-0392 TTY 9563772394) to talk to a trained counselor call 1-800-273-TALK (toll free, 24 hour hotline) if experiencing a Mental Health or Behavioral Health Crisis  avoid second hand smoke eliminate smoking in my home identify and remove indoor air pollutants limit outdoor activity during cold weather listen for public air quality announcements every day develop a rescue plan eliminate symptom triggers at home follow rescue plan if symptoms flare-up develop a new routine to improve sleep get at least 7 to 8 hours of sleep at night  Follow Up Plan: Telephone follow up appointment with care management team member scheduled for: 10-24-2023 at 945 am           Our next appointment is by telephone on 10-24-2023 at 0945 am  Please call the care guide team at [phone] if you need to cancel or reschedule your appointment.   If you are experiencing a Mental Health or Behavioral Health Crisis or need someone to talk to, please call the Suicide and Crisis Lifeline: 988 call the [place] National Suicide Prevention Lifeline: 360-434-6540 or TTY: 5675267890 TTY (732) 486-3664) to talk to a trained counselor call 1-800-273-TALK (toll free, 24 hour hotline)  Patient verbalizes understanding of instructions and care plan provided today and agrees to view in MyChart. Active MyChart status and patient understanding of how to access instructions and care plan via MyChart confirmed with patient.     Telephone follow up appointment with care management team member scheduled for: 10-24-2023 at 0945 am  Alto Denver RN, MSN, CCM RN Care Manager  [facility]  Ambulatory Care Management  Direct Number: [phone]

## 2023-09-12 ENCOUNTER — Other Ambulatory Visit: Payer: Self-pay | Admitting: Family Medicine

## 2023-09-12 DIAGNOSIS — F411 Generalized anxiety disorder: Secondary | ICD-10-CM

## 2023-09-12 NOTE — Telephone Encounter (Signed)
Requested Prescriptions  Pending Prescriptions Disp Refills   busPIRone (BUSPAR) 5 MG tablet [Pharmacy Med Name: busPIRone HCL 5 MG TABLET] 90 tablet 1    Sig: TAKE 1 TABLET BY MOUTH 3 TIMES A DAY AS NEEDED FOR ANXIETY     Psychiatry: Anxiolytics/Hypnotics - Non-controlled Passed - 09/12/2023  6:23 AM      Passed - Valid encounter within last 12 months    Recent Outpatient Visits           1 month ago Chronic bursitis of left shoulder   [place] [facility] Ker[place], Netta Neat, DO   1 month ago Annual physical exam   [place] [facility] [name], DO   7 months ago Pre-diabetes   [place] [facility] [name], DO   9 months ago Essential hypertension   [place] [facility] [name], DO   1 year ago Upper GI bleed   [place] [facility] [name], DO       Future Appointments             In 4 months Althea Charon, Netta Neat, DO [place] [facility]

## 2023-09-14 DIAGNOSIS — Z23 Encounter for immunization: Secondary | ICD-10-CM | POA: Diagnosis not present

## 2023-10-22 ENCOUNTER — Other Ambulatory Visit: Payer: Self-pay | Admitting: Family Medicine

## 2023-10-22 DIAGNOSIS — E7800 Pure hypercholesterolemia, unspecified: Secondary | ICD-10-CM

## 2023-10-23 NOTE — Telephone Encounter (Signed)
Requested Prescriptions  Refused Prescriptions Disp Refills   rosuvastatin (CRESTOR) 5 MG tablet [Pharmacy Med Name: ROSUVASTATIN CALCIUM 5 MG TAB] 45 tablet 3    Sig: TAKE 1 TABLET BY MOUTH EVERY OTHER DAY     Cardiovascular:  Antilipid - Statins 2 Failed - 10/22/2023  6:53 AM      Failed - Lipid Panel in normal range within the last 12 months    Cholesterol  Date Value Ref Range Status  07/14/2023 204 (H) <200 mg/dL Final   LDL Cholesterol (Calc)  Date Value Ref Range Status  07/14/2023 121 (H) mg/dL (calc) Final    Comment:    Reference range: <100 . Desirable range <100 mg/dL for primary prevention;   <70 mg/dL for patients with CHD or diabetic patients  with > or = 2 CHD risk factors. Marland Kitchen LDL-C is now calculated using the Martin-Hopkins  calculation, which is a validated novel method providing  better accuracy than the Friedewald equation in the  estimation of LDL-C.  Horald Pollen et al. Lenox Ahr. 9629;528(41): 2061-2068  (http://education.[facility].com/faq/FAQ164)    HDL  Date Value Ref Range Status  07/14/2023 60 > OR = 50 mg/dL Final   Triglycerides  Date Value Ref Range Status  07/14/2023 122 <150 mg/dL Final         Passed - Cr in normal range and within 360 days    Creat  Date Value Ref Range Status  07/14/2023 0.71 0.50 - 1.03 mg/dL Final         Passed - Patient is not pregnant      Passed - Valid encounter within last 12 months    Recent Outpatient Visits           3 months ago Chronic bursitis of left shoulder   [place] [facility], Netta Neat, DO   3 months ago Annual physical exam   [place] [facility] [name], DO   8 months ago Pre-diabetes   [place] [facility] [name], DO   10 months ago Essential hypertension   [place] [facility] [name], DO   1 year ago Upper GI bleed   [place] [facility] [name], DO       Future Appointments             In 3 months Althea Charon, Netta Neat, DO  [facility]

## 2023-10-24 ENCOUNTER — Other Ambulatory Visit: Payer: Self-pay

## 2023-10-24 ENCOUNTER — Other Ambulatory Visit: Payer: Self-pay | Admitting: Family Medicine

## 2023-10-24 DIAGNOSIS — L209 Atopic dermatitis, unspecified: Secondary | ICD-10-CM

## 2023-10-24 DIAGNOSIS — K529 Noninfective gastroenteritis and colitis, unspecified: Secondary | ICD-10-CM | POA: Diagnosis not present

## 2023-10-24 MED ORDER — TRIAMCINOLONE ACETONIDE 0.1 % EX CREA
1.0000 | TOPICAL_CREAM | Freq: Two times a day (BID) | CUTANEOUS | 3 refills | Status: DC
Start: 2023-10-24 — End: 2024-01-26

## 2023-10-24 NOTE — Patient Instructions (Signed)
Visit Information  Thank you for taking time to visit with me today. Please don't hesitate to contact me if I can be of assistance to you before our next scheduled telephone appointment.  Following are the goals we discussed today:   Goals Addressed             This Visit's Progress    RNCM Care Management Expected Outcome:  Monitor, Self-Manage and Reduce Symptoms of: Pre-Diabetes       Current Barriers:  Knowledge Deficits related to dietary changes to help with managing pre-DM Chronic Disease Management support and education needs related to effective management of pre-DM with hospitalization for GI bleed in September  Lab Results  Component Value Date   HGBA1C 5.9 (H) 07/14/2023     Planned Interventions: Provided education to patient about basic DM disease process. The patient denies any issues with her DM. Knows that she needs to have new blood work. Reminder provided. A1C is stable. Denies any changes in her DM health and well being.  Reviewed prescribed diet with patient heart healthy/ADA diet. Review of dietary restrictions. The patient is mindful of what she is eating especially since she has ulcers. The patient said she had a bad night last night and is having diarrhea. She states that everything she eats or drinks is going right through her. She is not running a fever. Education provided on staying hydrated and monitoring for worsening sx and sx. The patient verbalized understanding.  Counseled on importance of regular laboratory monitoring as prescribed. Labs are up to date. Has good control of her DM Discussed plans with patient for ongoing care management follow up and provided patient with direct contact information for care management team;      Provided patient with written educational materials related to hypo and hyperglycemia and importance of correct treatment. Review of the signs and symptoms of hypo and hyperglycemia.        Reviewed scheduled/upcoming provider  appointments including: 01-25-2024 at 08 am        call provider for findings outside established parameters;       Review of patient status, including review of consultants reports, relevant laboratory and other test results, and medications completed;       Advised patient to discuss changes that would warrant taking medications for DM, questions or concerns related to effective management of DM with provider;      She is asking for assistance with getting her triamcinolone cream refilled. Will send an in basket message to the pcp for assistance.   Symptom Management: Attend all scheduled provider appointments Call provider office for new concerns or questions  call the Suicide and Crisis Lifeline: 988 call the [place] National Suicide Prevention Lifeline: 615-142-3596 or TTY: (225)770-8634 TTY [phone]) to talk to a trained counselor call 1-800-273-TALK (toll free, 24 hour hotline) if experiencing a Mental Health or Behavioral Health Crisis  check feet daily for cuts, sores or redness trim toenails straight across manage portion size wash and dry feet carefully every day wear comfortable, cotton socks wear comfortable, well-fitting shoes  Follow Up Plan: Telephone follow up appointment with care management team member scheduled for: 12-26-2023 at 1015 am       RNCM Care Management Expected Outcome:  Monitor, Self-Manage, and Reduce Symptoms of Hypertension       Current Barriers:  Knowledge Deficits related to controlling blood pressures and monitoring for changes in blood pressure Care Coordination needs related to knowing how to effectively manage blood pressures  and calling the office for changes in blood pressures and when to take medications and when to hold in a patient with HTN Chronic Disease Management support and education needs related to effective management of HTN  BP Readings from Last 3 Encounters:  07/24/23 118/71  07/21/23 (!) 120/58  06/19/23 120/74     Planned  Interventions: Evaluation of current treatment plan related to hypertension self management and patient's adherence to plan as established by provider. The patient has stable blood pressures, no acute changes noted;  The patient states she was going to start taking her blood pressures and write them down but she has not yet. Encouraged her to take if she has a headache or dizziness to see what range her blood pressures may be in. Education and support provided. Will continue to monitor for changes. Provided education to patient re: stroke prevention, s/s of heart attack and stroke; Reviewed prescribed diet heart healthy diet, avoiding spicy foods due to recent GI Bleed. GI sx and sx have improved and the patient is using Peppermint capsules per directions of the pcp. The patient is compliant with heart healthy diet and monitoring her dietary intake. Is having some diarrhea currently. The patient states that she is going to get her some ginger ale or sprite to help as her coffee is going straight through her.  Education provided.  Reviewed medications with patient and discussed importance of compliance. The patient is compliant with medications. The patient has all of her medications switched over and denies any issues with getting her medications at this time. Discussed plans with patient for ongoing care management follow up and provided patient with direct contact information for care management team; Advised patient, providing education and rationale, to monitor blood pressure daily and record, calling PCP for findings outside established parameters;  Reviewed scheduled/upcoming provider appointments including:  next appointment on 01-25-2024. Knows to call for changes or new needs. Advised patient to discuss changes in blood pressures that warrant changes in medications or plan of care with provider; Provided education on prescribed diet heart healthy diet, avoiding spicy foods;  Discussed complications  of poorly controlled blood pressure such as heart disease, stroke, circulatory complications, vision complications, kidney impairment, sexual dysfunction;  Screening for signs and symptoms of depression related to chronic disease state;  Assessed social determinant of health barriers;  The patient is wanting to get her preventative health done. The patient received a letter about her mammogram. Answered questions and assisted with scheduling of her mammogram locally.  The patient states her shoulder pain is better. She states it "comes and goes" . Denies any acute findings today.    Symptom Management: Take medications as prescribed   Attend all scheduled provider appointments Call pharmacy for medication refills 3-7 days in advance of running out of medications Call provider office for new concerns or questions  call the Suicide and Crisis Lifeline: 988 call the [place] National Suicide Prevention Lifeline: 7754300649 or TTY: 2675894124 TTY (865)412-8648) to talk to a trained counselor call 1-800-273-TALK (toll free, 24 hour hotline) if experiencing a Mental Health or Behavioral Health Crisis   Follow Up Plan: Telephone follow up appointment with care management team member scheduled for: 12-26-2023 at 1015 am       RNCM Care Management:   Maintain, Monitor and Self-Manage Symptoms of COPD       Current Barriers:  Knowledge Deficits related to triggers and factors that may cause COPD Exacerbation Chronic Disease Management support and education needs related to  COPD  Planned Interventions: Provided patient with basic written and verbal COPD education on self care/management/and exacerbation prevention. The patient denies any acute changes in her COPD.  Advised patient to track and manage COPD triggers. Review of changes in weather and monitoring for others around her being sick, education on staying safe and protecting self during peak sickness times. Is monitoring for changes in her  breathing. Denies any acute changes in her COPD. Review of being mindful when she is around others that may be sick and could trigger an exacerbation of her sx and sx.  Provided  verbal instructions on pursed lip breathing and utilized returned demonstration as teach back Advised patient to self assesses COPD action plan zone and make appointment with provider if in the yellow zone for 48 hours without improvement. Review of monitoring for changes and taking medications as directed. Discussed calling the office for changes or new concerns with COPD and effective management of COPD. Has her medications. Denies any new needs with medications. Likes getting her medications from [employer]. Advised patient to engage in light exercise as tolerated 3-5 days a week to aid in the the management of COPD Provided education about and advised patient to utilize infection prevention strategies to reduce risk of respiratory infection. Review of risk factors Discussed the importance of adequate rest and management of fatigue with COPD Screening for signs and symptoms of depression related to chronic disease state  Assessed social determinant of health barriers She has gotten her medications changed over to her new pharmacy and denies any new medication needs at this time. Will continue to monitor.   Symptom Management: Attend all scheduled provider appointments Call provider office for new concerns or questions  call the Suicide and Crisis Lifeline: 988 call the [place] National Suicide Prevention Lifeline: 785 690 9888 or TTY: 726-233-2449 TTY [phone]) to talk to a trained counselor call 1-800-273-TALK (toll free, 24 hour hotline) if experiencing a Mental Health or Behavioral Health Crisis  avoid second hand smoke eliminate smoking in my home identify and remove indoor air pollutants limit outdoor activity during cold weather listen for public air quality announcements every day develop a rescue  plan eliminate symptom triggers at home follow rescue plan if symptoms flare-up develop a new routine to improve sleep get at least 7 to 8 hours of sleep at night  Follow Up Plan: Telephone follow up appointment with care management team member scheduled for: 12-26-2023 at 1015 am           Our next appointment is by telephone on 12-26-2023 at 1015 am  Please call the care guide team at [phone] if you need to cancel or reschedule your appointment.   If you are experiencing a Mental Health or Behavioral Health Crisis or need someone to talk to, please call the Suicide and Crisis Lifeline: 988 call the [place] National Suicide Prevention Lifeline: 408-852-5126 or TTY: (503)510-3489 TTY 909-284-0372) to talk to a trained counselor call 1-800-273-TALK (toll free, 24 hour hotline)   Patient verbalizes understanding of instructions and care plan provided today and agrees to view in MyChart. Active MyChart status and patient understanding of how to access instructions and care plan via MyChart confirmed with patient.       Alto Denver RN, MSN, CCM RN Care Manager  [facility]  Ambulatory Care Management  Direct Number: [phone]

## 2023-10-24 NOTE — Patient Outreach (Signed)
Care Management   Visit Note  10/24/2023 Name: Karys Kwasnik MRN: [id number] DOB: July 25, 1966  Subjective: Cheryl Payne is a 57 y.o. year old female who is a primary care patient of [name], DO. The Care Management team was consulted for assistance.      Engaged with patient spoke with patient by telephone.    Goals Addressed             This Visit's Progress    RNCM Care Management Expected Outcome:  Monitor, Self-Manage and Reduce Symptoms of: Pre-Diabetes       Current Barriers:  Knowledge Deficits related to dietary changes to help with managing pre-DM Chronic Disease Management support and education needs related to effective management of pre-DM with hospitalization for GI bleed in September  Lab Results  Component Value Date   HGBA1C 5.9 (H) 07/14/2023     Planned Interventions: Provided education to patient about basic DM disease process. The patient denies any issues with her DM. Knows that she needs to have new blood work. Reminder provided. A1C is stable. Denies any changes in her DM health and well being.  Reviewed prescribed diet with patient heart healthy/ADA diet. Review of dietary restrictions. The patient is mindful of what she is eating especially since she has ulcers. The patient said she had a bad night last night and is having diarrhea. She states that everything she eats or drinks is going right through her. She is not running a fever. Education provided on staying hydrated and monitoring for worsening sx and sx. The patient verbalized understanding.  Counseled on importance of regular laboratory monitoring as prescribed. Labs are up to date. Has good control of her DM Discussed plans with patient for ongoing care management follow up and provided patient with direct contact information for care management team;      Provided patient with written educational materials related to hypo and hyperglycemia and importance of correct treatment.  Review of the signs and symptoms of hypo and hyperglycemia.        Reviewed scheduled/upcoming provider appointments including: 01-25-2024 at 08 am        call provider for findings outside established parameters;       Review of patient status, including review of consultants reports, relevant laboratory and other test results, and medications completed;       Advised patient to discuss changes that would warrant taking medications for DM, questions or concerns related to effective management of DM with provider;      She is asking for assistance with getting her triamcinolone cream refilled. Will send an in basket message to the pcp for assistance.   Symptom Management: Attend all scheduled provider appointments Call provider office for new concerns or questions  call the Suicide and Crisis Lifeline: 988 call the [place] National Suicide Prevention Lifeline: 302-143-7721 or TTY: (862)268-3013 TTY [phone]) to talk to a trained counselor call 1-800-273-TALK (toll free, 24 hour hotline) if experiencing a Mental Health or Behavioral Health Crisis  check feet daily for cuts, sores or redness trim toenails straight across manage portion size wash and dry feet carefully every day wear comfortable, cotton socks wear comfortable, well-fitting shoes  Follow Up Plan: Telephone follow up appointment with care management team member scheduled for: 12-26-2023 at 1015 am       RNCM Care Management Expected Outcome:  Monitor, Self-Manage, and Reduce Symptoms of Hypertension       Current Barriers:  Knowledge Deficits related to controlling blood pressures and  monitoring for changes in blood pressure Care Coordination needs related to knowing how to effectively manage blood pressures and calling the office for changes in blood pressures and when to take medications and when to hold in a patient with HTN Chronic Disease Management support and education needs related to effective management of HTN  BP  Readings from Last 3 Encounters:  07/24/23 118/71  07/21/23 (!) 120/58  06/19/23 120/74     Planned Interventions: Evaluation of current treatment plan related to hypertension self management and patient's adherence to plan as established by provider. The patient has stable blood pressures, no acute changes noted;  The patient states she was going to start taking her blood pressures and write them down but she has not yet. Encouraged her to take if she has a headache or dizziness to see what range her blood pressures may be in. Education and support provided. Will continue to monitor for changes. Provided education to patient re: stroke prevention, s/s of heart attack and stroke; Reviewed prescribed diet heart healthy diet, avoiding spicy foods due to recent GI Bleed. GI sx and sx have improved and the patient is using Peppermint capsules per directions of the pcp. The patient is compliant with heart healthy diet and monitoring her dietary intake. Is having some diarrhea currently. The patient states that she is going to get her some ginger ale or sprite to help as her coffee is going straight through her.  Education provided.  Reviewed medications with patient and discussed importance of compliance. The patient is compliant with medications. The patient has all of her medications switched over and denies any issues with getting her medications at this time. Discussed plans with patient for ongoing care management follow up and provided patient with direct contact information for care management team; Advised patient, providing education and rationale, to monitor blood pressure daily and record, calling PCP for findings outside established parameters;  Reviewed scheduled/upcoming provider appointments including:  next appointment on 01-25-2024. Knows to call for changes or new needs. Advised patient to discuss changes in blood pressures that warrant changes in medications or plan of care with  provider; Provided education on prescribed diet heart healthy diet, avoiding spicy foods;  Discussed complications of poorly controlled blood pressure such as heart disease, stroke, circulatory complications, vision complications, kidney impairment, sexual dysfunction;  Screening for signs and symptoms of depression related to chronic disease state;  Assessed social determinant of health barriers;  The patient is wanting to get her preventative health done. The patient received a letter about her mammogram. Answered questions and assisted with scheduling of her mammogram locally.  The patient states her shoulder pain is better. She states it "comes and goes" . Denies any acute findings today.    Symptom Management: Take medications as prescribed   Attend all scheduled provider appointments Call pharmacy for medication refills 3-7 days in advance of running out of medications Call provider office for new concerns or questions  call the Suicide and Crisis Lifeline: 988 call the [place] National Suicide Prevention Lifeline: 716-063-4183 or TTY: 9363907330 TTY 587-616-2362) to talk to a trained counselor call 1-800-273-TALK (toll free, 24 hour hotline) if experiencing a Mental Health or Behavioral Health Crisis   Follow Up Plan: Telephone follow up appointment with care management team member scheduled for: 12-26-2023 at 1015 am       RNCM Care Management:   Maintain, Monitor and Self-Manage Symptoms of COPD       Current Barriers:  Knowledge Deficits related  to triggers and factors that may cause COPD Exacerbation Chronic Disease Management support and education needs related to COPD  Planned Interventions: Provided patient with basic written and verbal COPD education on self care/management/and exacerbation prevention. The patient denies any acute changes in her COPD.  Advised patient to track and manage COPD triggers. Review of changes in weather and monitoring for others around her being  sick, education on staying safe and protecting self during peak sickness times. Is monitoring for changes in her breathing. Denies any acute changes in her COPD. Review of being mindful when she is around others that may be sick and could trigger an exacerbation of her sx and sx.  Provided  verbal instructions on pursed lip breathing and utilized returned demonstration as teach back Advised patient to self assesses COPD action plan zone and make appointment with provider if in the yellow zone for 48 hours without improvement. Review of monitoring for changes and taking medications as directed. Discussed calling the office for changes or new concerns with COPD and effective management of COPD. Has her medications. Denies any new needs with medications. Likes getting her medications from [employer]. Advised patient to engage in light exercise as tolerated 3-5 days a week to aid in the the management of COPD Provided education about and advised patient to utilize infection prevention strategies to reduce risk of respiratory infection. Review of risk factors Discussed the importance of adequate rest and management of fatigue with COPD Screening for signs and symptoms of depression related to chronic disease state  Assessed social determinant of health barriers She has gotten her medications changed over to her new pharmacy and denies any new medication needs at this time. Will continue to monitor.   Symptom Management: Attend all scheduled provider appointments Call provider office for new concerns or questions  call the Suicide and Crisis Lifeline: 988 call the [place] National Suicide Prevention Lifeline: 803-659-4065 or TTY: (787)165-1948 TTY 256-633-0243) to talk to a trained counselor call 1-800-273-TALK (toll free, 24 hour hotline) if experiencing a Mental Health or Behavioral Health Crisis  avoid second hand smoke eliminate smoking in my home identify and remove indoor air pollutants limit  outdoor activity during cold weather listen for public air quality announcements every day develop a rescue plan eliminate symptom triggers at home follow rescue plan if symptoms flare-up develop a new routine to improve sleep get at least 7 to 8 hours of sleep at night  Follow Up Plan: Telephone follow up appointment with care management team member scheduled for: 12-26-2023 at 1015 am            Consent to Services:  Patient was given information about care management services, agreed to services, and gave verbal consent to participate.   Plan: Telephone follow up appointment with care management team member scheduled for: 12-26-2023 at 1015 am  Alto Denver RN, MSN, CCM RN Care Manager  [facility]  Ambulatory Care Management  Direct Number: [phone]

## 2023-11-08 ENCOUNTER — Other Ambulatory Visit: Payer: Self-pay | Admitting: Family Medicine

## 2023-11-08 DIAGNOSIS — F411 Generalized anxiety disorder: Secondary | ICD-10-CM

## 2023-11-08 DIAGNOSIS — F3341 Major depressive disorder, recurrent, in partial remission: Secondary | ICD-10-CM

## 2023-11-08 DIAGNOSIS — K279 Peptic ulcer, site unspecified, unspecified as acute or chronic, without hemorrhage or perforation: Secondary | ICD-10-CM

## 2023-11-08 DIAGNOSIS — G8929 Other chronic pain: Secondary | ICD-10-CM

## 2023-11-10 NOTE — Telephone Encounter (Signed)
D/C 07/21/23. Requested Prescriptions  Signed Prescriptions Disp Refills   gabapentin (NEURONTIN) 300 MG capsule 180 capsule 1    Sig: TAKE TWO CAPSULES BY MOUTH AT BEDTIME     Neurology: Anticonvulsants - gabapentin Passed - 11/08/2023  6:24 AM      Passed - Cr in normal range and within 360 days    Creat  Date Value Ref Range Status  07/14/2023 0.71 0.50 - 1.03 mg/dL Final         Passed - Completed PHQ-2 or PHQ-9 in the last 360 days      Passed - Valid encounter within last 12 months    Recent Outpatient Visits           3 months ago Chronic bursitis of left shoulder   [place] [facility] Althea Charon, Netta Neat, DO   3 months ago Annual physical exam   [place] [facility] [name], DO   9 months ago Pre-diabetes   [place] [facility] [name], DO   11 months ago Essential hypertension   [place] [facility] [name], DO   1 year ago Upper GI bleed   [place] [facility] Althea Charon, Netta Neat, DO       Future Appointments             In 2 months Althea Charon, Netta Neat, DO [place] [facility]             escitalopram (LEXAPRO) 20 MG tablet 90 tablet 1    Sig: TAKE 1 TABLET BY MOUTH AT BEDTIME     Psychiatry:  Antidepressants - SSRI Passed - 11/08/2023  6:24 AM      Passed - Completed PHQ-2 or PHQ-9 in the last 360 days      Passed - Valid encounter within last 6 months    Recent Outpatient Visits           3 months ago Chronic bursitis of left shoulder   [place] [facility], Netta Neat, DO   3 months ago Annual physical exam   [place] [facility] [name], DO   9 months ago Pre-diabetes   [place] [facility] [name], DO   11 months ago Essential hypertension   [place] [facility] [name], DO   1 year ago Upper GI bleed   [place] [facility] Althea Charon, Netta Neat, DO       Future Appointments             In 2 months Althea Charon, Netta Neat, DO  [facility]            Refused Prescriptions Disp Refills   omeprazole (PRILOSEC) 40 MG capsule [Pharmacy Med Name: OMEPRAZOLE DR 40 MG CAPSULE] 90 capsule 1    Sig: TAKE 1 CAPSULE BY MOUTH DAILY BEFORE BREAKFAST     Gastroenterology: Proton Pump Inhibitors Passed - 11/08/2023  6:24 AM      Passed - Valid encounter within last 12 months    Recent Outpatient Visits           3 months ago Chronic bursitis of left shoulder    [place] [facility] [place], Netta Neat, DO   3 months ago Annual physical exam   [facility] [name], DO   9 months ago Pre-diabetes   [place] [facility] [name], [place]   11 months ago Essential hypertension   [place] [facility] [name], DO   1 year ago Upper GI bleed   [place] [facility] [name], DO       Future Appointments             In 2 months Althea Charon, Netta Neat, DO [place] [facility], [facility]

## 2023-11-10 NOTE — Telephone Encounter (Signed)
Requested Prescriptions  Pending Prescriptions Disp Refills   gabapentin (NEURONTIN) 300 MG capsule [Pharmacy Med Name: GABAPENTIN 300 MG CAPSULE] 180 capsule 1    Sig: TAKE TWO CAPSULES BY MOUTH AT BEDTIME     Neurology: Anticonvulsants - gabapentin Passed - 11/08/2023  6:24 AM      Passed - Cr in normal range and within 360 days    Creat  Date Value Ref Range Status  07/14/2023 0.71 0.50 - 1.03 mg/dL Final         Passed - Completed PHQ-2 or PHQ-9 in the last 360 days      Passed - Valid encounter within last 12 months    Recent Outpatient Visits           3 months ago Chronic bursitis of left shoulder   [place] [facility] [place], Netta Neat, DO   3 months ago Annual physical exam   [place] [facility] [name], DO   9 months ago Pre-diabetes   [place] [facility] [name], DO   11 months ago Essential hypertension   [place] [facility] [name], DO   1 year ago Upper GI bleed   [place] [facility] Althea Charon, Netta Neat, DO       Future Appointments             In 2 months Althea Charon, Netta Neat, DO [place] [facility]             omeprazole (PRILOSEC) 40 MG capsule [Pharmacy Med Name: OMEPRAZOLE DR 40 MG CAPSULE] 90 capsule 1    Sig: TAKE 1 CAPSULE BY MOUTH DAILY BEFORE BREAKFAST     Gastroenterology: Proton Pump Inhibitors Passed - 11/08/2023  6:24 AM      Passed - Valid encounter within last 12 months    Recent Outpatient Visits           3 months ago Chronic bursitis of left shoulder   [place] [facility], Netta Neat, DO   3 months ago Annual physical exam   [place] [facility] [name], DO   9 months ago Pre-diabetes   [place] [facility] [name], DO   11 months ago  Essential hypertension   [place] [facility] [name], DO   1 year ago Upper GI bleed   [place] [facility] Althea Charon, Netta Neat, DO       Future Appointments             In 2 months Althea Charon, Netta Neat, DO [facility]             escitalopram (LEXAPRO) 20 MG tablet [Pharmacy Med Name: ESCITALOPRAM 20 MG TABLET] 90 tablet 1    Sig: TAKE 1 TABLET BY MOUTH AT BEDTIME     Psychiatry:  Antidepressants - SSRI Passed - 11/08/2023  6:24 AM      Passed - Completed PHQ-2 or PHQ-9 in the last 360 days      Passed - Valid encounter within last 6 months    Recent Outpatient Visits           3 months ago Chronic bursitis of left shoulder    [facility] [place], Netta Neat, DO   3 months ago Annual  physical exam   [place] [facility] [name], DO   9 months ago Pre-diabetes   [place] [facility] [name], [place]   11 months ago Essential hypertension   [place] [facility] [name], DO   1 year ago Upper GI bleed   [place] [facility] [name], DO       Future Appointments             In 2 months Althea Charon, Netta Neat, DO [place] [facility]

## 2023-11-12 ENCOUNTER — Other Ambulatory Visit: Payer: Self-pay | Admitting: Family Medicine

## 2023-11-12 DIAGNOSIS — J069 Acute upper respiratory infection, unspecified: Secondary | ICD-10-CM | POA: Diagnosis not present

## 2023-11-12 DIAGNOSIS — E7800 Pure hypercholesterolemia, unspecified: Secondary | ICD-10-CM

## 2023-11-12 DIAGNOSIS — Z20822 Contact with and (suspected) exposure to covid-19: Secondary | ICD-10-CM | POA: Diagnosis not present

## 2023-11-12 DIAGNOSIS — R059 Cough, unspecified: Secondary | ICD-10-CM | POA: Diagnosis not present

## 2023-11-13 ENCOUNTER — Other Ambulatory Visit: Payer: Self-pay | Admitting: Family Medicine

## 2023-11-13 DIAGNOSIS — F411 Generalized anxiety disorder: Secondary | ICD-10-CM

## 2023-11-14 ENCOUNTER — Other Ambulatory Visit: Payer: Self-pay

## 2023-11-14 DIAGNOSIS — E7800 Pure hypercholesterolemia, unspecified: Secondary | ICD-10-CM

## 2023-11-14 MED ORDER — ROSUVASTATIN CALCIUM 5 MG PO TABS: 5.0000 mg | ORAL_TABLET | ORAL | 3 refills | Status: DC

## 2023-11-14 NOTE — Telephone Encounter (Signed)
Requested Prescriptions  Pending Prescriptions Disp Refills   rosuvastatin (CRESTOR) 5 MG tablet [Pharmacy Med Name: ROSUVASTATIN CALCIUM 5 MG TAB] 45 tablet 3    Sig: TAKE 1 TABLET BY MOUTH EVERY OTHER DAY     Cardiovascular:  Antilipid - Statins 2 Failed - 11/12/2023 12:45 PM      Failed - Lipid Panel in normal range within the last 12 months    Cholesterol  Date Value Ref Range Status  07/14/2023 204 (H) <200 mg/dL Final   LDL Cholesterol (Calc)  Date Value Ref Range Status  07/14/2023 121 (H) mg/dL (calc) Final    Comment:    Reference range: <100 . Desirable range <100 mg/dL for primary prevention;   <70 mg/dL for patients with CHD or diabetic patients  with > or = 2 CHD risk factors. Marland Kitchen LDL-C is now calculated using the Martin-Hopkins  calculation, which is a validated novel method providing  better accuracy than the Friedewald equation in the  estimation of LDL-C.  Horald Pollen et al. Lenox Ahr. 1610;960(45): 2061-2068  (http://education.Quest[employer].com/faq/FAQ164)    HDL  Date Value Ref Range Status  07/14/2023 60 > OR = 50 mg/dL Final   Triglycerides  Date Value Ref Range Status  07/14/2023 122 <150 mg/dL Final         Passed - Cr in normal range and within 360 days    Creat  Date Value Ref Range Status  07/14/2023 0.71 0.50 - 1.03 mg/dL Final         Passed - Patient is not pregnant      Passed - Valid encounter within last 12 months    Recent Outpatient Visits           3 months ago Chronic bursitis of left shoulder   [place] [facility], Netta Neat, DO   3 months ago Annual physical exam   [place] [facility] [name], DO   9 months ago Pre-diabetes   [place] [facility] [name], [place]   11 months ago Essential hypertension   [place] [facility] [name], DO   1 year ago Upper GI bleed   [place] [facility] [name], DO       Future Appointments             In 2 months Althea Charon, Netta Neat, DO [place] [facility]

## 2023-11-14 NOTE — Telephone Encounter (Signed)
Requested Prescriptions  Pending Prescriptions Disp Refills   busPIRone (BUSPAR) 5 MG tablet [Pharmacy Med Name: busPIRone HCL 5 MG TABLET] 90 tablet 1    Sig: TAKE 1 TABLET BY MOUTH 3 TIMES A DAY AS NEEDED FOR ANXIETY     Psychiatry: Anxiolytics/Hypnotics - Non-controlled Passed - 11/13/2023  6:23 AM      Passed - Valid encounter within last 12 months    Recent Outpatient Visits           3 months ago Chronic bursitis of left shoulder   [place] [facility], Netta Neat, DO   3 months ago Annual physical exam   [place] [facility] [name], DO   9 months ago Pre-diabetes   [place] [facility] [name], [place]   11 months ago Essential hypertension   [place] [facility] [name], DO   1 year ago Upper GI bleed   [place] [facility] [name], DO       Future Appointments             In 2 months Althea Charon, Netta Neat, DO [place] [facility], [facility]

## 2023-12-03 ENCOUNTER — Other Ambulatory Visit: Payer: Self-pay | Admitting: Family Medicine

## 2023-12-03 DIAGNOSIS — I10 Essential (primary) hypertension: Secondary | ICD-10-CM

## 2023-12-04 ENCOUNTER — Other Ambulatory Visit: Payer: Self-pay

## 2023-12-04 DIAGNOSIS — I10 Essential (primary) hypertension: Secondary | ICD-10-CM

## 2023-12-04 MED ORDER — AMLODIPINE BESYLATE 5 MG PO TABS: 5.0000 mg | ORAL_TABLET | Freq: Every day | ORAL | 1 refills | Status: DC

## 2023-12-07 NOTE — Telephone Encounter (Signed)
 Requested Prescriptions  Refused Prescriptions Disp Refills   amLODipine  (NORVASC ) 5 MG tablet [Pharmacy Med Name: amLODIPine  BESYLATE 5 MG TAB] 90 tablet 1    Sig: TAKE 1 TABLET BY MOUTH DAILY     Cardiovascular: Calcium  Channel Blockers 2 Passed - 12/07/2023  1:11 PM      Passed - Last BP in normal range    BP Readings from Last 1 Encounters:  07/24/23 118/71         Passed - Last Heart Rate in normal range    Pulse Readings from Last 1 Encounters:  07/24/23 92         Passed - Valid encounter within last 6 months    Recent Outpatient Visits           4 months ago Chronic bursitis of left shoulder   [place] [facility] [place], Marsa PARAS, DO   4 months ago Annual physical exam   [place] [facility] Marsa PARAS, DO   10 months ago Pre-diabetes   [place] [facility] Marsa PARAS, DO   1 year ago Essential hypertension   [place] [facility] Marsa PARAS, DO   1 year ago Upper GI bleed   [place] [facility] [place] Marsa PARAS, DO       Future Appointments             In 1 month [place], Marsa PARAS, DO [place] [employer] [facility], [facility]

## 2023-12-08 DIAGNOSIS — M51369 Other intervertebral disc degeneration, lumbar region without mention of lumbar back pain or lower extremity pain: Secondary | ICD-10-CM | POA: Diagnosis not present

## 2023-12-08 DIAGNOSIS — M5126 Other intervertebral disc displacement, lumbar region: Secondary | ICD-10-CM | POA: Diagnosis not present

## 2023-12-08 DIAGNOSIS — M5416 Radiculopathy, lumbar region: Secondary | ICD-10-CM | POA: Diagnosis not present

## 2023-12-08 DIAGNOSIS — I700 Atherosclerosis of aorta: Secondary | ICD-10-CM | POA: Diagnosis not present

## 2023-12-08 DIAGNOSIS — M48061 Spinal stenosis, lumbar region without neurogenic claudication: Secondary | ICD-10-CM | POA: Diagnosis not present

## 2023-12-08 DIAGNOSIS — M79605 Pain in left leg: Secondary | ICD-10-CM | POA: Diagnosis not present

## 2023-12-13 DIAGNOSIS — M5416 Radiculopathy, lumbar region: Secondary | ICD-10-CM | POA: Diagnosis not present

## 2023-12-13 DIAGNOSIS — M5126 Other intervertebral disc displacement, lumbar region: Secondary | ICD-10-CM | POA: Diagnosis not present

## 2023-12-26 ENCOUNTER — Other Ambulatory Visit: Payer: 59

## 2023-12-26 ENCOUNTER — Other Ambulatory Visit: Payer: Self-pay

## 2024-01-01 DIAGNOSIS — C3411 Malignant neoplasm of upper lobe, right bronchus or lung: Secondary | ICD-10-CM | POA: Diagnosis not present

## 2024-01-01 NOTE — Patient Outreach (Signed)
  Care Management   Visit Note   Name: Cheryl Payne MRN: [id number] DOB: 20-Sep-1966  Subjective: Cheryl Payne is a 58 y.o. year old female who is a primary care patient of Cheryl Cords, DO. The Care Management team was consulted for assistance.      Engaged with Cheryl Payne via telephone.  Assessment:  Review of patient past medical history, allergies, medications, health status, including review of consultants reports, laboratory and other test data, was performed as part of  evaluation and provision of care management services.   Outpatient Encounter Medications as of 12/26/2023  Medication Sig Note   amLODipine (NORVASC) 5 MG tablet Take 1 tablet (5 mg total) by mouth daily.    busPIRone (BUSPAR) 5 MG tablet TAKE 1 TABLET BY MOUTH 3 TIMES A DAY AS NEEDED FOR ANXIETY    escitalopram (LEXAPRO) 20 MG tablet TAKE 1 TABLET BY MOUTH AT BEDTIME    fluticasone (FLONASE) 50 MCG/ACT nasal spray Place 1 spray into both nostrils daily.    gabapentin (NEURONTIN) 300 MG capsule TAKE TWO CAPSULES BY MOUTH AT BEDTIME 12/26/2023: Reports taking one during the day and two at night.   rosuvastatin (CRESTOR) 5 MG tablet Take 1 tablet (5 mg total) by mouth every other day.    triamcinolone cream (KENALOG) 0.1 % Apply 1 Application topically 2 (two) times daily.    No facility-administered encounter medications on file as of 12/26/2023.

## 2024-01-10 DIAGNOSIS — M5416 Radiculopathy, lumbar region: Secondary | ICD-10-CM | POA: Diagnosis not present

## 2024-01-10 DIAGNOSIS — M5126 Other intervertebral disc displacement, lumbar region: Secondary | ICD-10-CM | POA: Diagnosis not present

## 2024-01-10 DIAGNOSIS — M4807 Spinal stenosis, lumbosacral region: Secondary | ICD-10-CM | POA: Diagnosis not present

## 2024-01-12 ENCOUNTER — Other Ambulatory Visit: Payer: Self-pay | Admitting: Family Medicine

## 2024-01-12 DIAGNOSIS — M4807 Spinal stenosis, lumbosacral region: Secondary | ICD-10-CM

## 2024-01-15 ENCOUNTER — Other Ambulatory Visit: Payer: Self-pay | Admitting: Family Medicine

## 2024-01-15 DIAGNOSIS — Z1231 Encounter for screening mammogram for malignant neoplasm of breast: Secondary | ICD-10-CM

## 2024-01-18 ENCOUNTER — Ambulatory Visit
Admission: RE | Admit: 2024-01-18 | Discharge: 2024-01-18 | Disposition: A | Discharge status: Home / Self Care | Payer: 59 | Source: Ambulatory Visit | Attending: Family Medicine | Admitting: Family Medicine

## 2024-01-18 DIAGNOSIS — M5126 Other intervertebral disc displacement, lumbar region: Secondary | ICD-10-CM | POA: Diagnosis not present

## 2024-01-18 DIAGNOSIS — M4807 Spinal stenosis, lumbosacral region: Secondary | ICD-10-CM

## 2024-01-19 ENCOUNTER — Ambulatory Visit
Admission: RE | Admit: 2024-01-19 | Discharge: 2024-01-19 | Disposition: A | Discharge status: Home / Self Care | Payer: 59 | Source: Ambulatory Visit | Attending: Family Medicine | Admitting: Family Medicine

## 2024-01-19 DIAGNOSIS — Z1231 Encounter for screening mammogram for malignant neoplasm of breast: Secondary | ICD-10-CM | POA: Diagnosis not present

## 2024-01-25 ENCOUNTER — Ambulatory Visit: Payer: Self-pay | Admitting: Family Medicine

## 2024-01-26 ENCOUNTER — Ambulatory Visit (INDEPENDENT_AMBULATORY_CARE_PROVIDER_SITE_OTHER): Payer: 59 | Admitting: Family Medicine

## 2024-01-26 ENCOUNTER — Other Ambulatory Visit: Payer: Self-pay | Admitting: Family Medicine

## 2024-01-26 ENCOUNTER — Encounter: Payer: Self-pay | Admitting: Family Medicine

## 2024-01-26 VITALS — BP 120/64 | HR 74 | Ht 64.0 in | Wt 171.0 lb

## 2024-01-26 DIAGNOSIS — I10 Essential (primary) hypertension: Secondary | ICD-10-CM | POA: Diagnosis not present

## 2024-01-26 DIAGNOSIS — M5442 Lumbago with sciatica, left side: Secondary | ICD-10-CM

## 2024-01-26 DIAGNOSIS — Z0000 Encounter for general adult medical examination without abnormal findings: Secondary | ICD-10-CM

## 2024-01-26 DIAGNOSIS — M5416 Radiculopathy, lumbar region: Secondary | ICD-10-CM | POA: Diagnosis not present

## 2024-01-26 DIAGNOSIS — R7303 Prediabetes: Secondary | ICD-10-CM

## 2024-01-26 DIAGNOSIS — L209 Atopic dermatitis, unspecified: Secondary | ICD-10-CM

## 2024-01-26 DIAGNOSIS — G8929 Other chronic pain: Secondary | ICD-10-CM

## 2024-01-26 DIAGNOSIS — J432 Centrilobular emphysema: Secondary | ICD-10-CM

## 2024-01-26 DIAGNOSIS — E7800 Pure hypercholesterolemia, unspecified: Secondary | ICD-10-CM

## 2024-01-26 LAB — POCT GLYCOSYLATED HEMOGLOBIN (HGB A1C): Hemoglobin A1C: 5.6 % (ref 4.0–5.6)

## 2024-01-26 MED ORDER — TRIAMCINOLONE ACETONIDE 0.1 % EX OINT: 1.0000 | TOPICAL_OINTMENT | Freq: Two times a day (BID) | CUTANEOUS | 2 refills | Status: AC

## 2024-01-26 MED ORDER — HYDROCODONE-ACETAMINOPHEN 5-325 MG PO TABS: 1.0000 | ORAL_TABLET | Freq: Four times a day (QID) | ORAL | 0 refills | Status: AC | PRN

## 2024-01-26 MED ORDER — PREGABALIN 50 MG PO CAPS
50.0000 mg | ORAL_CAPSULE | Freq: Two times a day (BID) | ORAL | 0 refills | Status: DC
Start: 2024-01-26 — End: 2024-02-15

## 2024-01-26 NOTE — Patient Instructions (Addendum)
 Thank you for coming to the office today.  Re ordered the pain pill Hydrocodone, 28  pills  Switch Gabapentin over to Lyrica (Pregabalin) 50mg  start once a day for 1 week can increase to TWICE a day after 5-7 days.  Keep up with [facility] on the MRI result and future plans.  Recent Labs    07/14/23 0755 01/26/24 0817  HGBA1C 5.9* 5.6   Excellent job with weight loss and sugar control  DUE for FASTING BLOOD WORK (no food or drink after midnight before the lab appointment, only water or coffee without cream/sugar on the morning of)  SCHEDULE "Lab Only" visit in the morning at the clinic for lab draw in 6 MONTHS   - Make sure Lab Only appointment is at about 1 week before your next appointment, so that results will be available  For Lab Results, once available within 2-3 days of blood draw, you can can log in to MyChart online to view your results and a brief explanation. Also, we can discuss results at next follow-up visit.   Please schedule a Follow-up Appointment to: Return in about 6 months (around 07/25/2024) for 6 month fasting lab > 1 week later Annual Physical.  If you have any other questions or concerns, please feel free to call the office or send a message through MyChart. You may also schedule an earlier appointment if necessary.  Additionally, you may be receiving a survey about your experience at our office within a few days to 1 week by e-mail or mail. We value your feedback.  Saralyn Pilar, DO [facility], [place]

## 2024-01-26 NOTE — Progress Notes (Signed)
 Subjective:    Patient ID: Cheryl Payne, female    DOB: [birth year], 58 y.o.   MRN: [id number]  Cheryl Payne is a 58 y.o. female presenting on 01/26/2024 for Back Pain, Sciatica, and Hyperglycemia   HPI  Discussed the use of AI scribe software for clinical note transcription with the patient, who gave verbal consent to proceed.  History of Present Illness    Cheryl Payne is a 58 year old female who presents with chronic pain management and MRI results.   Chronic Low Back Pain Lumbar Radiculitis Followed by Dr Yves Dill [facility] She has received ESI injection x 2 without significant results On previous visit 01/10/24, she was given oral prednisone taper 6 days duration through 01/16/24 with good results while on medicine but then pain resumed off of medication Describes Left lower extremity radiating pain into foot/heel, also with reduced sensation in feet Also given Tramadol earlier in 12/2023 and it was ineffective, she was next given Hydrocodone 5/325 short course and takes at night with relief She continues  - She had Lumbar MRI completed 01/18/24, pending result and follow-up -She has been more active with new job  Elevated A1c Result down to 5.6 today, prior 5.9 Weight loss >20 lbs in past 6 months, 194 lbs down to 171 lbs  Generalized Anxiety Disorder Major Depression, chronic recurrent mild - partial remission Chronic history 10+ years in past, has had primarily issues with generalized anxiety disorder Improved on Escitalopram 20mg  daily + Buspar PRN   A1c Elevated A1c 5.9, prior 5.6 to 5.7 Goal to improve diet   Hypertension HLD - Currently taking Rosuvastatin 5mg  every other day tolerating well without side effects or myalgias - Taking Amlodipine 5mg  daiily       01/26/2024    8:09 AM 07/28/2023    8:33 AM 07/24/2023    8:03 AM  Depression screen PHQ 2/9  Decreased Interest 1 1 1   Down, Depressed, Hopeless 2 1 1   PHQ - 2 Score 3 2 2   Altered  sleeping 3 1 3   Tired, decreased energy 2 1 1   Change in appetite 1 0 1  Feeling bad or failure about yourself  0 0 0  Trouble concentrating 0 0 1  Moving slowly or fidgety/restless 0 0 0  Suicidal thoughts 0 0 0  PHQ-9 Score 9 4 8   Difficult doing work/chores Not difficult at all Not difficult at all Not difficult at all       01/26/2024    8:09 AM 07/24/2023    8:04 AM 07/21/2023    7:58 AM 02/10/2023    8:07 AM  GAD 7 : Generalized Anxiety Score  Nervous, Anxious, on Edge 1 1 1 1   Control/stop worrying 2 1 2 1   Worry too much - different things 1 1 3 1   Trouble relaxing 1 1 2 1   Restless 1 1 1  0  Easily annoyed or irritable 2 2 1 1   Afraid - awful might happen 0 1 1 0  Total GAD 7 Score 8 8 11 5   Anxiety Difficulty Somewhat difficult Somewhat difficult Somewhat difficult Not difficult at all    Social History   Tobacco Use   Smoking status: Former    Current packs/day: 0.00    Average packs/day: 1 pack/day for 35.0 years (35.0 ttl pk-yrs)    Types: Cigarettes    Quit date: 01/29/2019    Years since quitting: 4.9   Smokeless tobacco: Former    Quit date: 06/04/2018  Vaping Use   Vaping status: Never Used  Substance Use Topics   Alcohol use: Not Currently    Comment: Dont drink anymore   Drug use: Not Currently    Types: Marijuana    Review of Systems Per HPI unless specifically indicated above     Objective:    BP 120/64   Pulse 74   Ht 5\' 4"  (1.626 m)   Wt 171 lb (77.6 kg)   SpO2 97%   BMI 29.35 kg/m   Wt Readings from Last 3 Encounters:  01/26/24 171 lb (77.6 kg)  07/24/23 192 lb (87.1 kg)  07/21/23 194 lb 4.8 oz (88.1 kg)    Physical Exam Vitals and nursing note reviewed.  Constitutional:      General: She is not in acute distress.    Appearance: Normal appearance. She is well-developed. She is not diaphoretic.     Comments: Well-appearing, comfortable, cooperative  HENT:     Head: Normocephalic and atraumatic.  Eyes:     General:        Right  eye: No discharge.        Left eye: No discharge.     Conjunctiva/sclera: Conjunctivae normal.  Cardiovascular:     Rate and Rhythm: Normal rate.  Pulmonary:     Effort: Pulmonary effort is normal.  Skin:    General: Skin is warm and dry.     Findings: No erythema or rash.  Neurological:     Mental Status: She is alert and oriented to person, place, and time.  Psychiatric:        Mood and Affect: Mood normal.        Behavior: Behavior normal.        Thought Content: Thought content normal.     Comments: Well groomed, good eye contact, normal speech and thoughts     Results for orders placed or performed in visit on 01/26/24  POCT HgB A1C   Collection Time: 01/26/24  8:17 AM  Result Value Ref Range   Hemoglobin A1C 5.6 4.0 - 5.6 %   HbA1c POC (<> result, manual entry)     HbA1c, POC (prediabetic range)     HbA1c, POC (controlled diabetic range)        Assessment & Plan:   Problem List Items Addressed This Visit     Chronic left-sided low back pain with left-sided sciatica   Relevant Medications   pregabalin (LYRICA) 50 MG capsule   HYDROcodone-acetaminophen (NORCO/VICODIN) 5-325 MG tablet   Essential hypertension   Other Visit Diagnoses       Pre-diabetes    -  Primary   Relevant Orders   POCT HgB A1C (Completed)     Lumbar radiculitis       Relevant Medications   pregabalin (LYRICA) 50 MG capsule   HYDROcodone-acetaminophen (NORCO/VICODIN) 5-325 MG tablet     Atopic dermatitis, unspecified type       Relevant Medications   triamcinolone ointment (KENALOG) 0.1 %         Chronic Lower Back Pain with Left Leg Radiculopathy / Sciatica Followed by Physiatry Persistent pain despite epidural steroid injection and prednisone taper. MRI pending to assess extent of nerve involvement. Still with active pain, limiting function. -Continue Hydrocodone as needed, with a refill ordered. -Discontinue Gabapentin and initiate Pregabalin (Lyrica) 50mg  once daily, can  increase to twice daily after 5-7 days. -Await MRI results for further management per Physiatry, anticipate may warrant Neurosurgery  Pre-Diabetes Noted significant weight loss >20  lbs and good blood sugar control. -Encourage continuation of current lifestyle modifications contributing to weight loss and blood sugar control. -Schedule annual wellness check.     Hypertension Improved w/ weight loss Controlled on Amlodipine 5mg  daily, continue therapy  Atopic Dermatitis Varous locations Re order Triamcinolone 0.1% ointment  Orders Placed This Encounter  Procedures   POCT HgB A1C    Meds ordered this encounter  Medications   pregabalin (LYRICA) 50 MG capsule    Sig: Take 1 capsule (50 mg total) by mouth 2 (two) times daily.    Dispense:  60 capsule    Refill:  0    Stop Gabapentin Switch to Pregabalin   HYDROcodone-acetaminophen (NORCO/VICODIN) 5-325 MG tablet    Sig: Take 1 tablet by mouth every 6 (six) hours as needed for up to 7 days for moderate pain (pain score 4-6). Max 4 per day    Dispense:  28 tablet    Refill:  0   triamcinolone ointment (KENALOG) 0.1 %    Sig: Apply 1 Application topically 2 (two) times daily.    Dispense:  30 g    Refill:  2    Follow up plan: Return in about 6 months (around 07/25/2024) for 6 month fasting lab > 1 week later Annual Physical.  Future labs ordered for 07/25/24   Saralyn Pilar, DO [facility] 01/26/2024, 8:11 AM

## 2024-01-30 DIAGNOSIS — J069 Acute upper respiratory infection, unspecified: Secondary | ICD-10-CM | POA: Diagnosis not present

## 2024-01-30 DIAGNOSIS — C3411 Malignant neoplasm of upper lobe, right bronchus or lung: Secondary | ICD-10-CM | POA: Diagnosis not present

## 2024-01-31 ENCOUNTER — Encounter: Payer: Self-pay | Admitting: Family Medicine

## 2024-02-12 DIAGNOSIS — M5416 Radiculopathy, lumbar region: Secondary | ICD-10-CM | POA: Diagnosis not present

## 2024-02-12 DIAGNOSIS — M5126 Other intervertebral disc displacement, lumbar region: Secondary | ICD-10-CM | POA: Diagnosis not present

## 2024-02-15 ENCOUNTER — Telehealth: Payer: Self-pay

## 2024-02-15 DIAGNOSIS — M5416 Radiculopathy, lumbar region: Secondary | ICD-10-CM

## 2024-02-15 MED ORDER — PREGABALIN 100 MG PO CAPS
100.0000 mg | ORAL_CAPSULE | Freq: Two times a day (BID) | ORAL | 2 refills | Status: DC
Start: 2024-02-15 — End: 2024-02-26

## 2024-02-15 NOTE — Telephone Encounter (Signed)
 Patient advised about new prescription. She was crying on the phone stating she was in so much pain. I advised her to go to the ED or urgent care if her symptoms aren't improving. She voiced understanding.

## 2024-02-15 NOTE — Telephone Encounter (Signed)
 Copied from CRM [id number]. Topic: General - Other >> Feb 15, 2024  3:25 PM Emylou G wrote: Reason for CRM: Please call patient back - msg to Dr Kirtland Bouchard.. she is looking to increase dosage for pregabalin (LYRICA) 50 MG capsule because said it isn't working?  [phone]

## 2024-02-15 NOTE — Addendum Note (Signed)
 Addended by: [name] on: 02/15/2024 03:53 PM   Modules accepted: Orders

## 2024-02-15 NOTE — Telephone Encounter (Signed)
 Please let her know that I can dose increase the Pregabalin Lyrica from 50 twice a day up to 100mg  twice a day. New rx sent.  Saralyn Pilar, DO [facility] 02/15/2024, 3:53 PM

## 2024-02-18 ENCOUNTER — Encounter: Payer: Self-pay | Admitting: Intensive Care

## 2024-02-18 ENCOUNTER — Other Ambulatory Visit: Payer: Self-pay

## 2024-02-18 ENCOUNTER — Emergency Department
Admission: EM | Admit: 2024-02-18 | Discharge: 2024-02-18 | Disposition: A | Discharge status: Home / Self Care | Attending: Emergency Medicine | Admitting: Emergency Medicine

## 2024-02-18 DIAGNOSIS — M5432 Sciatica, left side: Secondary | ICD-10-CM

## 2024-02-18 DIAGNOSIS — M5442 Lumbago with sciatica, left side: Secondary | ICD-10-CM | POA: Insufficient documentation

## 2024-02-18 DIAGNOSIS — M545 Low back pain, unspecified: Secondary | ICD-10-CM | POA: Diagnosis present

## 2024-02-18 MED ORDER — HYDROMORPHONE HCL 1 MG/ML IJ SOLN
0.5000 mg | Freq: Once | INTRAMUSCULAR | Status: AC
  Administered 2024-02-18: 0.5 mg via INTRAMUSCULAR
  Filled 2024-02-18: qty 0.5

## 2024-02-18 MED ORDER — TIZANIDINE HCL 4 MG PO TABS: 4.0000 mg | ORAL_TABLET | Freq: Three times a day (TID) | ORAL | 0 refills | Status: DC

## 2024-02-18 MED ORDER — PREDNISONE 10 MG (21) PO TBPK: ORAL_TABLET | ORAL | 0 refills | Status: DC

## 2024-02-18 MED ORDER — OXYCODONE-ACETAMINOPHEN 7.5-325 MG PO TABS: 1.0000 | ORAL_TABLET | ORAL | 0 refills | Status: DC | PRN

## 2024-02-18 NOTE — ED Notes (Signed)
 See triage notes. Patient c/o left buttocks pain that travels down her left leg. Patient stated this started in December and has progressively gotten worse.

## 2024-02-18 NOTE — Discharge Instructions (Signed)
 Do not take the muscle relaxer and the pain medication at the same time. Wait at least 6 hours between them.  Follow up with your PCP and Dr. Yves Dill. You may also call to schedule an appointment with the neurosurgeon if you are not improving with injections and prescribed medications.   If your symptoms change or worsen, please return to the ER.

## 2024-02-18 NOTE — ED Provider Notes (Signed)
 [facility] Emergency Department Provider Note ____________________________________________  Time seen: Approximately 1:59 PM  I have reviewed the triage vital signs and the nursing notes.  HISTORY  Chief Complaint Sciatica   HPI Cheryl Payne is a 58 y.o. female presents to the emergency department for treatment and evaluation left lower back pain that radiates down the left leg.  Pain started in December and has progressively worsened and is now persistent.  She has not 2 ultrasound-guided injections no relief.  The last injection was about a week ago.  She is also taking her prescribed pain medications without relief.  She denies new symptoms today or change in chronic symptoms.  Allergies Patient has no known allergies.  PHYSICAL EXAM:  VITAL SIGNS: ED Triage Vitals [02/18/24 1350]  Encounter Vitals Group     BP 121/81     Systolic BP Percentile      Diastolic BP Percentile      Pulse Rate 100     Resp 16     Temp 98.3 F (36.8 C)     Temp Source Oral     SpO2 95 %     Weight 180 lb (81.6 kg)     Height 5\' 2"  (1.575 m)     Head Circumference      Peak Flow      Pain Score 8     Pain Loc      Pain Education      Exclude from Growth Chart     Constitutional: Alert and oriented. Well appearing and in no acute distress. Eyes: Conjunctivae are clear without discharge or drainage.  Head: Atraumatic. Neck: Full, active range of motion. Respiratory: Respirations even and unlabored. Musculoskeletal: Decreased ROM of the back and extremities, Strength 5/5 of the lower extremities as tested. Neurologic: Reflexes of the lower extremities are 2+.  Unable to perform straight leg raise on the either side.           Negative for chronic steroid use                      Negative for IV drug use.            Negative for trauma in the presence of osteoporosis           Negative for age over 60 and trauma.           Negative for constitutional symptoms,  or history of cancer           Negative for pain worse at night.                      Positive for burning, tingling, numb, electric, radiating pain in the left lower extremity.                      Negative for saddle anesthesia.                      Negative for focal neurologic deficit, progressive or disabling symptoms           Negative for saddle anesthesia.                      Rectal tone normal Skin: Atraumatic.  Psychiatric: Behavior and affect are normal.  ____________________________________________   LABS (all labs ordered are listed, but only abnormal results are displayed)  ________________________________  RADIOLOGY  Not indicated  ____________________________________________   PROCEDURES  Procedure(s) performed:  Procedures ____________________________________________   INITIAL IMPRESSION / ASSESSMENT AND PLAN / ED COURSE  Patient's presentation is most consistent with acute illness / injury with system symptoms.   58 year old female presenting to the emergency department for pain control secondary to acute on chronic left side back pain that radiates down left leg.  See HPI for further details.  Patient had MRI of the lumbar spine as recent as February of this year.  She denies new or change in her symptoms therefore imaging is not indicated.  Patient states that she did feel better when she took a steroid pack and is requesting a change in her pain medication.  Plan will be to give her an IM injection for pain control tonight and send meds to her pharmacy.  She was encouraged to call and schedule an appointment with neurosurgery if she wishes to determine if there is a surgical intervention that could provide her long-term relief.  ER return precautions discussed.    Pertinent labs & imaging results that were available during my care of the patient were reviewed by me and considered in my medical decision making (see chart for details).    _________________________________________   FINAL CLINICAL IMPRESSION(S) / ED DIAGNOSES   Final diagnoses:  Sciatica, left side     If controlled substance prescribed during this visit, 12 month history viewed on the [facility] prior to issuing an initial prescription for Schedule II or III opiod.    Chinita Pester, FNP 02/22/24 Ninfa Linden    Jene Every, MD 02/26/24 [phone]

## 2024-02-18 NOTE — ED Triage Notes (Signed)
 C/o left buttocks pain that radiates down leg. Reports flare up started in December and has progressively gotten worse.  Reports she had MRI done a few months ago and recently had injections Monday with no relief.

## 2024-02-20 ENCOUNTER — Other Ambulatory Visit: Payer: Self-pay

## 2024-02-20 DIAGNOSIS — M5432 Sciatica, left side: Secondary | ICD-10-CM

## 2024-02-20 NOTE — Progress Notes (Signed)
 Xrays have been ordered.

## 2024-02-23 ENCOUNTER — Other Ambulatory Visit: Payer: Self-pay | Admitting: Family Medicine

## 2024-02-23 DIAGNOSIS — G8929 Other chronic pain: Secondary | ICD-10-CM

## 2024-02-23 MED ORDER — OXYCODONE-ACETAMINOPHEN 7.5-325 MG PO TABS: 1.0000 | ORAL_TABLET | ORAL | 0 refills | Status: DC | PRN

## 2024-02-23 NOTE — Telephone Encounter (Signed)
 Copied from CRM [id number]. Topic: Clinical - Medication Refill >> Feb 23, 2024  1:14 PM Almira Coaster wrote: Most Recent Primary Care Visit:  Provider: [name]  Department: [facility]  Visit Type: OFFICE VISIT  Date: 01/26/2024  Medication: oxyCODONE-acetaminophen (PERCOCET) 7.5-325 MG tablet  Has the patient contacted their pharmacy? No, medication prescribed during an ER visit.  (Agent: If no, request that the patient contact the pharmacy for the refill. If patient does not wish to contact the pharmacy document the reason why and proceed with request.) (Agent: If yes, when and what did the pharmacy advise?)  Is this the correct pharmacy for this prescription? Yes If no, delete pharmacy and type the correct one.  This is the patient's preferred pharmacy:  [facility] [id number] [place], [place] - [address] [place] [address] Phone: [phone] Fax: [phone]    Has the prescription been filled recently? Yes  Is the patient out of the medication? No, she has two pills left  Has the patient been seen for an appointment in the last year OR does the patient have an upcoming appointment? Yes  Can we respond through MyChart? Yes  Agent: Please be advised that Rx refills may take up to 3 business days. We ask that you follow-up with your pharmacy.

## 2024-02-26 ENCOUNTER — Ambulatory Visit
Admission: RE | Admit: 2024-02-26 | Discharge: 2024-02-26 | Disposition: A | Discharge status: Home / Self Care | Source: Ambulatory Visit | Attending: Neurosurgery | Admitting: Neurosurgery

## 2024-02-26 ENCOUNTER — Encounter: Payer: Self-pay | Admitting: Physician Assistant

## 2024-02-26 ENCOUNTER — Ambulatory Visit (INDEPENDENT_AMBULATORY_CARE_PROVIDER_SITE_OTHER): Admitting: Physician Assistant

## 2024-02-26 VITALS — BP 106/62 | Ht 62.0 in | Wt 159.0 lb

## 2024-02-26 DIAGNOSIS — M47816 Spondylosis without myelopathy or radiculopathy, lumbar region: Secondary | ICD-10-CM | POA: Diagnosis not present

## 2024-02-26 DIAGNOSIS — M5416 Radiculopathy, lumbar region: Secondary | ICD-10-CM

## 2024-02-26 DIAGNOSIS — M5432 Sciatica, left side: Secondary | ICD-10-CM | POA: Diagnosis not present

## 2024-02-26 DIAGNOSIS — M5417 Radiculopathy, lumbosacral region: Secondary | ICD-10-CM | POA: Diagnosis not present

## 2024-02-26 DIAGNOSIS — M5127 Other intervertebral disc displacement, lumbosacral region: Secondary | ICD-10-CM | POA: Diagnosis not present

## 2024-02-26 DIAGNOSIS — M545 Low back pain, unspecified: Secondary | ICD-10-CM | POA: Diagnosis not present

## 2024-02-26 NOTE — Progress Notes (Unsigned)
 Referring Physician:  [name], DO [address] [place],  [place] [address]  Primary Physician:  [name], DO  History of Present Illness: 02/26/2024 Ms. Malajah Payne is here today with a chief complaint of left low back pain that radiates down the back of her left leg into her heel x 3 months.  This is associated with numbness and tingling as well as weakness secondary to pain.  She was recently given a steroid, oxycodone, and tizanidine from the emergency department but was unsure how to take these medications.  As a result she has only been using the oxycodone for her pain.  She is very concerned at the severity of her pain.  Her pain is exacerbated by walking or standing for prolonged period of time.   Duration: 3 months  Bowel/Bladder Dysfunction: none  Conservative measures:  Physical therapy:  has not participated in PT Multimodal medical therapy including regular antiinflammatories:  Prednisone, Oxycodone, Tizanidine, Lyrica, Tramadol Injections: 02/12/2024: Left L5-S1 and left S1 transforaminal ESI (Celestone 12 mg) 12/13/2023: Left S1 transforaminal ESI (good relief x 4 days, was not able to rest and had to continue working) 11/05/2021: Left S1 transforaminal ESI (90% relief)   Past Surgery: none  Cheryl Payne has no symptoms of cervical myelopathy.  The symptoms are causing a significant impact on the patient's life.   Review of Systems:  A 10 point review of systems is negative, except for the pertinent positives and negatives detailed in the HPI.  Past Medical History: Past Medical History:  Diagnosis Date   Anxiety NA   Arthritis    Blood transfusion without reported diagnosis 2023   When i was in the hospital   Cancer [facility])    lung   Chronic left shoulder pain    Depression    GERD (gastroesophageal reflux disease)    Hypertension    Ulcer 2023   When i was in the hospital    Past Surgical History: Past Surgical  History:  Procedure Laterality Date   COLONOSCOPY WITH PROPOFOL N/A 03/24/2023   Procedure: COLONOSCOPY WITH PROPOFOL;  Surgeon: Wyline Mood, MD;  [facility]COPY;  Service: Gastroenterology;  Laterality: N/A;   ESOPHAGOGASTRODUODENOSCOPY (EGD) WITH PROPOFOL N/A 08/17/2022   Procedure: ESOPHAGOGASTRODUODENOSCOPY (EGD) WITH PROPOFOL;  Surgeon: Wyline Mood, MD;  Location: [facility] ENDOSCOPY;  Service: Gastroenterology;  Laterality: N/A;   ESOPHAGOGASTRODUODENOSCOPY (EGD) WITH PROPOFOL N/A 03/24/2023   Procedure: ESOPHAGOGASTRODUODENOSCOPY (EGD) WITH PROPOFOL;  Surgeon: Wyline Mood, MD;  Location[facility] ENDOSCOPY;  Service: Gastroenterology;  Laterality: N/A;   LUNG SURGERY      Allergies: Allergies as of 02/26/2024   (No Known Allergies)    Medications: Outpatient Encounter Medications as of 02/26/2024  Medication Sig   amLODipine (NORVASC) 5 MG tablet Take 1 tablet (5 mg total) by mouth daily.   busPIRone (BUSPAR) 5 MG tablet TAKE 1 TABLET BY MOUTH 3 TIMES A DAY AS NEEDED FOR ANXIETY   escitalopram (LEXAPRO) 20 MG tablet TAKE 1 TABLET BY MOUTH AT BEDTIME   fluticasone (FLONASE) 50 MCG/ACT nasal spray Place 1 spray into both nostrils daily.   oxyCODONE-acetaminophen (PERCOCET) 7.5-325 MG tablet Take 1 tablet by mouth every 4 (four) hours as needed for severe pain (pain score 7-10).   rosuvastatin (CRESTOR) 5 MG tablet Take 1 tablet (5 mg total) by mouth every other day.   tiZANidine (ZANAFLEX) 4 MG tablet Take 1 tablet (4 mg total) by mouth 3 (three) times daily.   triamcinolone ointment (KENALOG)  0.1 % Apply 1 Application topically 2 (two) times daily.   predniSONE (STERAPRED UNI-PAK 21 TAB) 10 MG (21) TBPK tablet Take 6 tablets on the first day and decrease by 1 tablet each day until finished. (Patient not taking: Reported on 02/26/2024)   [DISCONTINUED] pregabalin (LYRICA) 100 MG capsule Take 1 capsule (100 mg total) by mouth 2 (two) times daily.   No facility-administered encounter  medications on file as of 02/26/2024.    Social History: Social History   Tobacco Use   Smoking status: Former    Current packs/day: 0.00    Average packs/day: 1 pack/day for 35.0 years (35.0 ttl pk-yrs)    Types: Cigarettes    Quit date: 01/29/2019    Years since quitting: 5.0   Smokeless tobacco: Former    Quit date: 06/04/2018  Vaping Use   Vaping status: Never Used  Substance Use Topics   Alcohol use: Not Currently    Comment: Dont drink anymore   Drug use: Not Currently    Types: Marijuana    Family Medical History: Family History  Problem Relation Age of Onset   Stomach cancer Mother 89   Breast cancer Mother    Cancer Mother    Hypertension Mother    Stroke Mother    Lung cancer Father 13    Physical Examination: @VITALWITHPAIN @  General: Patient is well developed, well nourished, calm, collected, and in no apparent distress. Attention to examination is appropriate.  Psychiatric: Patient is non-anxious.  Head:  Pupils equal, round, and reactive to light.  ENT:  Oral mucosa appears well hydrated.  Neck:   Supple.  Full range of motion.  Respiratory: Patient is breathing without any difficulty.  Extremities: No edema.  Vascular: Palpable dorsal pedal pulses.  Skin:   On exposed skin, there are no abnormal skin lesions.  NEUROLOGICAL:     Awake, alert, oriented to person, place, and time.  Speech is clear and fluent. Fund of knowledge is appropriate.   Cranial Nerves: Pupils equal round and reactive to light.  Facial tone is symmetric.  Facial sensation is symmetric.  ROM of spine: Pain limited.  Mild tenderness to palpation over lumbar paraspinals.  Strength:  Side Iliopsoas Quads Hamstring PF DF EHL  R 5 5 5 5 5 5   L 5 5 5 4 5  4+   Reflexes are 1+ at bilateral patella.  1+ at right Achilles, absent left Achilles reflex. Clonus is not present.  Toes are down-going.  Sensation to light touch is intact however patient feels as though sensation is  decreased in L5 dermatomal distribution over left lower extremity. Gait is normal although patient appears to be in pain.  Medical Decision Making  Imaging: MRI Lumbar spine 01/18/24:  Exam Status  Status  Final [99]   PACS Intelerad Image Link   Show images for MR LUMBAR SPINE WO CONTRAST Study Result  Narrative & Impression  CLINICAL DATA:  Chronic low back pain radiating to lower extremities   EXAM: MRI LUMBAR SPINE WITHOUT CONTRAST   TECHNIQUE: Multiplanar, multisequence MR imaging of the lumbar spine was performed. No intravenous contrast was administered.   COMPARISON:  None Available.   FINDINGS: For the purposes of this dictation, the lowest well-formed intervertebral disc spaces presumed to be the L5-S1 level, and there presumed to be 5 lumbar type vertebral bodies.   The vertebral bodies are normally aligned with preservation of the normal lumbar lordosis. Vertebral body heights are well preserved. Signal intensity within the vertebral  body bone marrow and spinal cord is normal. The conus medullaris terminates at the L1 level.   At L1-2, no disc herniation or canal stenosis   At L2-3, slightly desiccated disc material with a small central bulging disc without disc herniation or canal stenosis or foraminal narrowing.   At L3-4, slight disc desiccation with circumferential bulging disc flattening the thecal sac encroaching on both neural foramina, left more than right.   At L4-5, desiccated disc material with a multi medium-sized central disc herniation flattening the thecal sac with no foraminal narrowing.   At L5-S1, desiccated disc material with a large central, left paracentral disc herniation encroaching the left neural foramina effacing the left S1 nerve root as it goes into the foramina.   No fractures   IMPRESSION: L5-S1 disc herniation as described. Multilevel degenerative disc disease with bulging discs as described.     I have  personally reviewed the images and agree with the above interpretation.  Assessment and Plan: Ms. Demmon is a pleasant 58 y.o. female with left lumbar radiculopathy at L5-S1 x 3 months.  This is associated with numbness, tingling, and sharp pain that starts in her back and extends down her left leg.  She has some mild weakness on examination to plantarflexion and EHL on her left side.  This is also associated with decree sensation in the L5 dermatomal distribution.  No ankle clonus noted.  MRI shows large central, left paracentral disc herniation at L5-S1 with multilevel degenerative disc changes elsewhere.  Is a pleasure having patient in clinic today.  We discussed her MRI and her current symptoms at length.  We went over her current pain regimen and the importance of starting the steroid, as well as alternating they Flexeril and oxycodone and the side effects involved with these.  We also discussed using gabapentin again for neuropathic pain which she has used previously.  She has not yet undergone physical therapy.  She did undergo a left L5-S1 ESI injection 2 weeks ago with only slight relief of her pain.  -PT referral placed -Start prednisone.  Continue oxycodone and tizanidine as needed for pain.  Reviewed gabapentin usage again.   -See back in 6 to 8 weeks following physical therapy.  We did discuss potentially undergoing surgery to relieve pressure on the nerve root at the L5-S1 level.   Thank you for involving me in the care of this patient.   I spent a total of 45 minutes in both face-to-face and non-face-to-face activities for this visit on the date of this encounter.  Including preparing to see the patient, reviewing her previous x-rays and MRI, obtaining her history, performing her examination, counseling the patient on her condition and what to expect, placing referral, documenting, independently interpreting results, and her care coordination.  Joan Flores, PA-C Dept. of  Neurosurgery

## 2024-02-26 NOTE — Patient Instructions (Signed)
 LOCAL PHYSICAL THERAPY  [facility]  [address]  [place], [place] [address]  [phone]  [facility]  [address] [place], [place] [address]  [phone]  [facility] (2 locations)  [address]  [place], [place] [address]  [phone]          or  [address]  [place], [place] [address]  [phone]  [facility]  [address]  [place], [place] [address]  [phone]  **dry needling**  [facility] [place] [facility])  [address]  [place], [place] [address]  [phone]  Fax: [phone]  ** Aquatic therapy[address] [place], [place] [address] [phone] **Aquatic therapy**  [facility]  [facility]  [address]  [place], [place] [address]  [phone]  [facility]  [address]  [place], [place] [address]  [phone]  [facility]  [address]   Janora Norlander  [place], [place] [address]  [phone]  [facility]  [address]  [place], [place] [address]  [phone]  **dry needling**   PELVIC FLOOR/SI JOINT  [facility]  Mariane Masters, PT  [email or web address]   [place]  [facility]  [address]  [address] [place], [place] [address]  [phone]   [facility] [place]  [address], [place] [address]  [phone]   [facility], [place]  [facility]  Raymond Gurney, PT  [address]  [place], [place] [address] [phone]   Samara Deist  [facility]  [address]  [phone]   [facility] Therapy  [address]  [phone]   [facility]  Annamaria Boots [facility]  [address]  [place], [place] [address]  [phone]   **No Aetna or medicaid**  [facility]  [address] [place] [address]  [phone]  [facility]  [address]  [place], [place] [address]  [phone]  Dannial Monarch, DC  [address] [place], [place] [address]  [phone]  [facility]  [address] [place] [address]  [phone]  [facility] D[facility]  [address]  [place], [place] [address]  [phone]  [facility]  (3 locations)  [address]  [place], [place] [address]  [phone]  **dry needling**           or  [address] [place], [place] [address]  [phone]  **Additionally has Gloris Manchester, OT**           or  [address]  [place], [place] [address]  [phone]  **Pediatric therapy**  [facility]  [address]  [phone]  **dry needlingVerdie Drown [facility]  [address]  [place], [place] [address]  [phone]  [facility]   (Inside [address] [facility])  [address]  [place], [place] [address]  [phone]  **dry needling**  **MEDICAID or UNINSURED** [facility] dept. Of Physical Therapy [place], [place] [address] [phone]  [facility]  [address] [place], [place] [address]  [phone]   [facility]  [address]  [place], [place] [address]  [phone]   Doreatha Martin  [facility]  [address] [place], [place] [address]  [phone]   [facility]  [address]  [place], [place] [address]  [phone]   [facility]  [address] [place], [place] [address]  [phone]  [facility]  [address]  [place], [place] [address]  [phone]  AQUATIC  Kathalene Frames [facility]  [employer]  [facility]  [place]  [place]  *Residents only*   [facility] [employer]  *[facility]  Exercise class  [facility]  Exercise class  [facility]  [address]  [place], [place]   [phone]  [facility]  [address]  [place], [place] [address]  [phone]   [place], [place]  [facility]  [address]  [phone]   [facility]  [facility]  [address]  [phone]           or  [address]  [phone]   [facility] Arthritis Support Group   Provides education and support and practical information for coping with arthritis for arthritis sufferers and their families.   When: 12:15 - 1:30 p.m. the second Monday of each month, March through December  Info: Call Rehabilitation Services at [phone]

## 2024-02-27 ENCOUNTER — Ambulatory Visit (INDEPENDENT_AMBULATORY_CARE_PROVIDER_SITE_OTHER): Admitting: Podiatry

## 2024-02-27 DIAGNOSIS — Q828 Other specified congenital malformations of skin: Secondary | ICD-10-CM

## 2024-02-27 NOTE — Progress Notes (Signed)
 Subjective:  Patient ID: Cheryl Payne, female    DOB: 1966-10-21,  MRN: [id number]  Chief Complaint  Patient presents with   Nail Problem    58 y.o. female presents with the above complaint.  Patient presents with right plantar midfoot porokeratotic lesion painful to touch is progressive and worse worse with ambulation worse with pressure patient would like to have it removed she says she has tried few different things on her own but has not helped pain scale is 5 out of 10 dull aching nature   Review of Systems: Negative except as noted in the HPI. Denies N/V/F/Ch.  Past Medical History:  Diagnosis Date   Anxiety NA   Arthritis    Blood transfusion without reported diagnosis 2023   When i was in the hospital   Cancer [facility])    lung   Chronic left shoulder pain    Depression    GERD (gastroesophageal reflux disease)    Hypertension    Ulcer 2023   When i was in the hospital    Current Outpatient Medications:    amLODipine (NORVASC) 5 MG tablet, Take 1 tablet (5 mg total) by mouth daily., Disp: 90 tablet, Rfl: 1   busPIRone (BUSPAR) 5 MG tablet, TAKE 1 TABLET BY MOUTH 3 TIMES A DAY AS NEEDED FOR ANXIETY, Disp: 90 tablet, Rfl: 1   escitalopram (LEXAPRO) 20 MG tablet, TAKE 1 TABLET BY MOUTH AT BEDTIME, Disp: 90 tablet, Rfl: 1   fluticasone (FLONASE) 50 MCG/ACT nasal spray, Place 1 spray into both nostrils daily., Disp: , Rfl:    oxyCODONE-acetaminophen (PERCOCET) 7.5-325 MG tablet, Take 1 tablet by mouth every 4 (four) hours as needed for severe pain (pain score 7-10)., Disp: 30 tablet, Rfl: 0   predniSONE (STERAPRED UNI-PAK 21 TAB) 10 MG (21) TBPK tablet, Take 6 tablets on the first day and decrease by 1 tablet each day until finished. (Patient not taking: Reported on 02/26/2024), Disp: 21 tablet, Rfl: 0   rosuvastatin (CRESTOR) 5 MG tablet, Take 1 tablet (5 mg total) by mouth every other day., Disp: 45 tablet, Rfl: 3   tiZANidine (ZANAFLEX) 4 MG tablet, Take 1 tablet (4 mg  total) by mouth 3 (three) times daily., Disp: 30 tablet, Rfl: 0   triamcinolone ointment (KENALOG) 0.1 %, Apply 1 Application topically 2 (two) times daily., Disp: 30 g, Rfl: 2  Social History   Tobacco Use  Smoking Status Former   Current packs/day: 0.00   Average packs/day: 1 pack/day for 35.0 years (35.0 ttl pk-yrs)   Types: Cigarettes   Quit date: 01/29/2019   Years since quitting: 5.0  Smokeless Tobacco Former   Quit date: 06/04/2018    No Known Allergies Objective:  There were no vitals filed for this visit. There is no height or weight on file to calculate BMI. Constitutional Well developed. Well nourished.  Vascular Dorsalis pedis pulses palpable bilaterally. Posterior tibial pulses palpable bilaterally. Capillary refill normal to all digits.  No cyanosis or clubbing noted. Pedal hair growth normal.  Neurologic Normal speech. Oriented to person, place, and time. Epicritic sensation to light touch grossly present bilaterally.  Dermatologic Right foot hyperkeratotic lesion with central nucleated core noted pain on palpation  Orthopedic: Normal joint ROM without pain or crepitus bilaterally. No visible deformities. No bony tenderness.   Radiographs: None Assessment:   1. Porokeratosis    Plan:  Patient was evaluated and treated and all questions answered.  Right plantar midfoot porokeratosis -All questions and concerns were discussed with  the patient in extensive detail given the amount of pain that she is having she will benefit from aggressive debridement of the lesion using chisel blade to handle the lesion was debride down to healthy scar tissue no complication noted no pinpoint bleeding noted.  No follow-ups on file.

## 2024-02-28 ENCOUNTER — Other Ambulatory Visit: Payer: Self-pay

## 2024-03-03 ENCOUNTER — Telehealth: Payer: Self-pay | Admitting: Family Medicine

## 2024-03-03 DIAGNOSIS — G8929 Other chronic pain: Secondary | ICD-10-CM

## 2024-03-04 ENCOUNTER — Telehealth: Payer: Self-pay | Admitting: Physician Assistant

## 2024-03-04 DIAGNOSIS — M5126 Other intervertebral disc displacement, lumbar region: Secondary | ICD-10-CM | POA: Diagnosis not present

## 2024-03-04 DIAGNOSIS — M4807 Spinal stenosis, lumbosacral region: Secondary | ICD-10-CM | POA: Diagnosis not present

## 2024-03-04 DIAGNOSIS — M5416 Radiculopathy, lumbar region: Secondary | ICD-10-CM | POA: Diagnosis not present

## 2024-03-04 MED ORDER — OXYCODONE-ACETAMINOPHEN 7.5-325 MG PO TABS: 1.0000 | ORAL_TABLET | ORAL | 0 refills | Status: DC | PRN

## 2024-03-04 NOTE — Telephone Encounter (Signed)
 Please notify patient that her pain medication refill was signed late on Monday 3/31. It should be ready on Tuesday 4/1  She called [place] back to request if it was refilled, but it was routed to me as a Rx Refill not a [employer]. The original refill request was submitted by the patient on Sunday, so that is the reason for the delay.  Saralyn Pilar, DO [facility] 03/04/2024, 7:37 PM

## 2024-03-04 NOTE — Telephone Encounter (Signed)
 Pt called and was wanting to know if this was filled. She stated that she is completely out and went to therapy today and needs them,

## 2024-03-04 NOTE — Telephone Encounter (Signed)
 Patient wanted to call and update Cheryl Payne that she was seen at [facility] and she will be getting set up with physical therapy so she can be on the right track for surgery.  Also she wanted to request a refill of her oxycodone- she said that she tried to send it through [facility] but could not get it to come to [place]. From the looks of it, it went to her primary care who refilled it last time as well. Please advise.

## 2024-03-05 NOTE — Telephone Encounter (Signed)
 Lm for patient to return call.

## 2024-03-08 ENCOUNTER — Encounter: Payer: Self-pay | Admitting: Neurosurgery

## 2024-03-11 ENCOUNTER — Other Ambulatory Visit: Payer: Self-pay | Admitting: Family Medicine

## 2024-03-11 DIAGNOSIS — G8929 Other chronic pain: Secondary | ICD-10-CM

## 2024-03-11 MED ORDER — OXYCODONE-ACETAMINOPHEN 7.5-325 MG PO TABS: 1.0000 | ORAL_TABLET | ORAL | 0 refills | Status: DC | PRN

## 2024-03-12 ENCOUNTER — Ambulatory Visit: Attending: Physician Assistant

## 2024-03-12 DIAGNOSIS — R2689 Other abnormalities of gait and mobility: Secondary | ICD-10-CM | POA: Insufficient documentation

## 2024-03-12 DIAGNOSIS — M6281 Muscle weakness (generalized): Secondary | ICD-10-CM | POA: Insufficient documentation

## 2024-03-12 DIAGNOSIS — M5459 Other low back pain: Secondary | ICD-10-CM | POA: Insufficient documentation

## 2024-03-12 DIAGNOSIS — M5416 Radiculopathy, lumbar region: Secondary | ICD-10-CM | POA: Insufficient documentation

## 2024-03-12 NOTE — Therapy (Signed)
 OUTPATIENT PHYSICAL THERAPY THORACOLUMBAR EVALUATION   Patient Name: Cheryl Payne MRN: [id number] DOB:08-Jan-1966, 58 y.o., female Today's Date: 03/12/2024  END OF SESSION:  PT End of Session - 03/12/24 1559     Visit Number 1    Number of Visits 25    Date for PT Re-Evaluation 06/04/24    Progress Note Due on Visit 10    PT Start Time 1600    PT Stop Time 1650    PT Time Calculation (min) 50 min    Activity Tolerance Patient tolerated treatment well    Behavior During Therapy [facility] for tasks assessed/performed             Past Medical History:  Diagnosis Date   Anxiety NA   Arthritis    Blood transfusion without reported diagnosis 2023   When i was in the hospital   Cancer [facility])    lung   Chronic left shoulder pain    Depression    GERD (gastroesophageal reflux disease)    Hypertension    Ulcer 2023   When i was in the hospital   Past Surgical History:  Procedure Laterality Date   COLONOSCOPY WITH PROPOFOL N/A 03/24/2023   Procedure: COLONOSCOPY WITH PROPOFOL;  Surgeon: Wyline Mood, MD;  Location: [facility] ENDOSCOPY;  Service: Gastroenterology;  Laterality: N/A;   ESOPHAGOGASTRODUODENOSCOPY (EGD) WITH PROPOFOL N/A 08/17/2022   Procedure: ESOPHAGOGASTRODUODENOSCOPY (EGD) WITH PROPOFOL;  Surgeon: Wyline Mood, MD;  Location[facility] ENDOSCOPY;  Service: Gastroenterology;  Laterality: N/A;   ESOPHAGOGASTRODUODENOSCOPY (EGD) WITH PROPOFOL N/A 03/24/2023   Procedure: ESOPHAGOGASTRODUODENOSCOPY (EGD) WITH PROPOFOL;  Surgeon: Wyline Mood, MD;  Location: [facility] ENDOSCOPY;  Service: Gastroenterology;  Laterality: N/A;   LUNG SURGERY     Patient Active Problem List   Diagnosis Date Noted   Blood on toilet paper 03/24/2023   Adenomatous polyp of colon 03/24/2023   Gastric ulcer 03/24/2023   GI bleed 08/17/2022   Upper GI bleed 08/16/2022   Leukocytosis 08/16/2022   Chronic left-sided low back pain with left-sided sciatica 09/21/2021   Pure hypercholesterolemia 07/13/2020    Major depressive disorder, recurrent, in partial remission (HCC) 07/13/2020   GAD (generalized anxiety disorder) 07/13/2020   Encounter for screening mammogram for malignant neoplasm of breast 07/13/2020   Muscle spasm of left shoulder 07/13/2020   Chronic left shoulder pain 07/13/2020   Centrilobular emphysema (HCC) 07/13/2020   Moderate aortic valve insufficiency 07/13/2020   Obesity (BMI 30.0-34.9) 07/13/2020   DOE (dyspnea on exertion) 01/10/2020   Non-small cell carcinoma of lung (HCC) 01/02/2019   Cancer of upper lobe of right lung (HCC) 11/15/2018   Lung mass 10/02/2018   Essential hypertension 09/02/2014    PCP: [name], DO  REFERRING PROVIDER: Joan Flores, PA-C  REFERRING DIAG:  [phone] (ICD-10-CM) - Lumbar radiculopathy    RATIONALE FOR EVALUATION AND TREATMENT: Rehabilitation  THERAPY DIAG: Other low back pain  Muscle weakness (generalized)  Other abnormalities of gait and mobility  ONSET DATE: Chronic (2019)  FOLLOW-UP APPT SCHEDULED WITH REFERRING PROVIDER: Yes    SUBJECTIVE:  SUBJECTIVE STATEMENT:  Chronic Low Back Pain   PERTINENT HISTORY:   Patient reports that she has had lower back pain since 2019. She reports that she has seen her provider and received multiple steroid injections with little relief.  Patient reports that pain starts in the lower back and refers down the posterior side of the L leg and ends the toes. Numbness and tingling are present in the posterior thigh and plantar aspect of the left foot. No relief from recent prescription (oxycodone, tremedol, hydrocodone). Patient states that the pain intensifies with prolonged walking, sitting or standing. She reports that she spends most of the day on the floor or lying down due to the radiating  pain. She reports that pain in the L leg that disrupts her sleep throughout the night. Denies Saddle Anaesthesia, loss b/b, fever/chills.   Imaging Per Chart Review (02/26/2024):   EXAM: LUMBAR SPINE - COMPLETE 4+ VIEW   COMPARISON:  MRI lumbar spine January 18, 2024   FINDINGS: Minimal anterior osteophytic changes involving the L3-4 disc space. There is preservation of the height of the lumbar vertebral bodies and intervertebral disc spaces   No spondylolysis or listhesis   IMPRESSION: Minimal degenerative changes  PAIN:    Pain Intensity: Present: 9/10, Best: 5/10, Worst: 10/10 Pain location: lower back, Left posterior thigh and feet  Pain Quality: sharp and stabbing  Radiating: Yes  Numbness/Tingling: Yes Focal Weakness: No Aggravating factors: Prolonged walking, standing, sitting  Relieving factors: Rest, lying on back,  How long can you sit: < 10 min  How long can you stand: < 10 min  History of prior back injury, pain, surgery, or therapy: Yes Dominant hand: right  PRECAUTIONS: Fall  WEIGHT BEARING RESTRICTIONS: No  FALLS: Has patient fallen in last 6 months? No  Living Environment Lives with: Alone  Lives in: House/apartment Stairs: No Has following equipment at home: None  Prior level of function: Independent  Occupational demands: Retired   Presenter, broadcasting: Engineer, water, Cooking   Patient Goals: Relieve Pain and return PLOF    OBJECTIVE:  Patient Surveys    Cognition Patient is oriented to person, place, and time.  Attention span and concentration are intact.  Expressive speech is intact.     Gross Musculoskeletal Assessment Tremor: None Bulk: Normal Tone: Normal No visible step-off along spinal column, no signs of scoliosis  GAIT: Distance walked: 73m Assistive device utilized: None Level of assistance: Complete Independence Comments: Antalgic, Narrow Step Width, bilateral knee valgus present  Posture: Lumbar lordosis: WNL Iliac crest  height: Equal bilaterally Lumbar lateral shift: Negative  AROM AROM (Normal range in degrees) AROM   Lumbar   Flexion (65) 85% limited due to hamstring tightness  Extension (30) 100%  Right lateral flexion (25) 100%  Left lateral flexion (25) 100%  Right rotation (30) 100%  Left rotation (30) 100%      Hip Right Left  Flexion (125) WNL WNL  Extension (15)    Abduction (40)    Adduction     Internal Rotation (45) 30 30  External Rotation (45) 30 20*      Knee    Flexion (135) WNL WNL  Extension (0) WNL WNL      Ankle    Dorsiflexion (20)    Plantarflexion (50)    Inversion (35)    Eversion (15)    (* = pain; Blank rows = not tested)  LE MMT: MMT (out of 5) Right  Left   Hip flexion 3+ 3+  Hip extension    Hip abduction 4 3+  Hip adduction    Hip internal rotation    Hip external rotation    Knee flexion 4+ 4-  Knee extension 4+ 4-  Ankle dorsiflexion    Ankle plantarflexion    Ankle inversion    Ankle eversion    (* = pain; Blank rows = not tested)  Sensation Grossly intact to light touch throughout bilateral LEs as determined by testing dermatomes.   Intermittent sharp pain noted in R LE  in L5-S1 dermatomal pattern.  Reflexes Not Test  Muscle Length Hamstrings: R: Negative 20 L: Positive: 40 with reproduction of pain  Palpation Location Right Left         Lumbar paraspinals 0 0  Quadratus Lumborum 0 0  Gluteus Maximus 1 1  Gluteus Medius 1 1  Deep hip external rotators 1 2  PSIS    Fortin's Area (SIJ)    Greater Trochanter 1 2  (Blank rows = not tested) Graded on 0-4 scale (0 = no pain, 1 = pain, 2 = pain with wincing/grimacing/flinching, 3 = pain with withdrawal, 4 = unwilling to allow palpation)  Passive Accessory Intervertebral Motion Pt reports reproduction of back pain with CPA L4-S1 segments with no improvements following prolonged CPA.   Special Tests Lumbar Radiculopathy and Discogenic: Centralization and Peripheralization  (SN 92, -LR 0.12): Not Tested  Slump (SN 83, -LR 0.32): Not Tested SLR (SN 92, -LR 0.29): Not Tested  Hip: FABER (SN 81): R: Negative L: Negative FADIR (SN 94): R: Negative L: Negative  Functional Tasks  Deep squat: Narrow BOS, Hip hinge absent, decreased ankle dorsiflexion   Sit to stand: Use of BUE support, minor knee valgus present  Forward Step-Down Test: R: Pain with Knee Valgus present   TODAY'S TREATMENT: DATE: 03/12/2024   PT Demo and pt return demonstration of initial HEP: (Good return demonstration from pt)  Exercises: Supine Figure 4 Piriformis Stretch 2 x 30 - BLE  Supine Single Knee to Chest Stretch (LLE) 1 x 30s   Seated Hamstring Stretch  (LLE) 2 x 30s  VC for B Hands on LLE and to reach for toes until tolerable stretch   Bridge with Hip Abduction and Resistance   2 x 10 with Red TB   Sidelying Clam with Resistance    1 x 10 with Red TB ea leg   - VC's for proper sidelying and technique   PATIENT EDUCATION:  Education details: HEP and POC  Person educated: Patient Education method: Explanation, Actor cues, Verbal cues, and Handouts Education comprehension: verbalized understanding, returned demonstration, and needs further education   HOME EXERCISE PROGRAM:  Access Code: [email or web address] URL: [email or web address] Date: 03/12/2024 Prepared by: Cristal Deer Hendel Gatliff  Exercises - Supine Figure 4 Piriformis Stretch  - 1 x daily - 7 x weekly - 3 sets - 30-60 hold - Supine Single Knee to Chest Stretch  - 1 x daily - 7 x weekly - 3 sets - 30-60 hold - Seated Hamstring Stretch  - 1 x daily - 7 x weekly - 3 sets - 30-60 hold - Bridge with Hip Abduction and Resistance  - 1 x daily - 3-4 x weekly - 2-3 sets - 10-12 reps - Clam with Resistance  - 1 x daily - 3-4 x weekly - 2-3 sets - 10-12 reps   ASSESSMENT:  CLINICAL IMPRESSION:  Patient is a pleasant 58 y.o. female who was seen today for physical therapy evaluation and treatment for lower back  pain  with radiating symptoms. During the evaluation, the pt expressed significant discomfort and was visibly distressed at times. Majority of evaluation pt was positioned in supine in order to reduce exacerbation pain. Lumbar AROM mostly full without concordant pain; lumbar flexion slightly limited due to hamstring tightness. Pt presents with physical impairments of decreased activity tolerance, decreased ROM of L hip, and decreased strength in  LLE as noted. TTP along the greater trochanter bilaterally (L > R), deep gluteals and L piriformis. Hamstring length testing significant for concordant pain in L LE and a difference of 20 from R Leg. Lumbar CPA along L3-L5 segments significant for concordant pain, no improvements with prolonged CPA. PT suspects that majority of lumbar pain possibly originating from L gluteal muscles. Pt endorsed improvements in pain following pt return demonstration of initial HEP. Pt's deficits are significantly limiting sleep, standing, ability to lift items. Based on today's performance, patient will continue to benefit from skilled PT in order to address current deficits and facilitate return to PLOF.   OBJECTIVE IMPAIRMENTS: Abnormal gait, decreased activity tolerance, difficulty walking, decreased ROM, decreased strength, and pain.   ACTIVITY LIMITATIONS: carrying, bending, sitting, standing, squatting, and sleeping  PARTICIPATION LIMITATIONS: driving, shopping, and community activity  PERSONAL FACTORS: Age, Social background, and Time since onset of injury/illness/exacerbation are also affecting patient's functional outcome.   REHAB POTENTIAL: Good  CLINICAL DECISION MAKING: Evolving/moderate complexity  EVALUATION COMPLEXITY: Moderate   GOALS: Goals reviewed with patient? No  SHORT TERM GOALS: Target date: 04/23/2024  Pt will be independent with HEP in order to improve strength and decrease back pain to improve pain-free function at home and work. Baseline:  n/a Goal status: INITIAL   LONG TERM GOALS: Target date: 06/04/2024  Pt will be able to sit for 30-60 minutes without reporting discomfort in order to demonstrate significant improvements in household ADLs and return to PLOF.  Baseline: n/a  Goal status: INITIAL  2.  Pt will decrease worst back pain by at least 2 points on the NPRS in order to demonstrate clinically significant reduction in back pain. Baseline: 03/12/2024: 10/10 NPS Goal status: INITIAL  3.  Pt will decrease mODI score by at least 13 points in order demonstrate clinically significant reduction in back pain/disability.     Baseline: Deferred to next visit Goal status: INITIAL  4.  Pt will increase L Hip MMT by 1 grade in order to demonstrate clinically significant improvements in LE strength.  Baseline: 03/12/2024 L Hip Flex: 3+/5, Abd: 3+/5 Goal status: INITIAL  5.  Pt will improve 5xSTS to 12 seconds or less to demonstrate clinically significant improvement in LE strength and ability to transfer pain free Baseline: Deferred to next session  Goal status: INITIAL    PLAN: PT FREQUENCY: 1-2x/week  PT DURATION: 12 weeks  PLANNED INTERVENTIONS: Therapeutic exercises, Therapeutic activity, Neuromuscular re-education, Balance training, Gait training, Patient/Family education, Self Care, Joint mobilization, Joint manipulationOrthotic/Fit training, DME instructions, Dry Needling, Electrical stimulation, Spinal manipulation, Spinal mobilization, Cryotherapy, Moist heat, Taping, Traction, Ultrasound, Manual therapy, and Re-evaluation.  PLAN FOR NEXT SESSION: MODI, 5XSTS, Initial Hip Strengthening/Stretching, Education on nerve flossing   Kelita Wallis PT, DPT 03/12/2024, 9:53 PM

## 2024-03-13 ENCOUNTER — Other Ambulatory Visit: Payer: Self-pay

## 2024-03-13 NOTE — Patient Outreach (Signed)
 Complex Care Management   Visit Note  03/13/2024  Name:  Cheryl Payne MRN: [id number] DOB: 1966/05/30  Situation: Referral received for Complex Care Management related to Hypertentions and Emphysema. I obtained verbal consent from Ms Crosby.  Visit completed with Ms. Mccarver   via telephone.  Background:   Past Medical History:  Diagnosis Date   Anxiety NA   Arthritis    Blood transfusion without reported diagnosis 2023   When i was in the hospital   Cancer [facility])    lung   Chronic left shoulder pain    Depression    GERD (gastroesophageal reflux disease)    Hypertension    Ulcer 2023   When i was in the hospital    Assessment: Patient Reported Symptoms:  Cognitive Alert and oriented to person, place, and time, Normal speech and language skills  Neurological Weakness    HEENT No symptoms reported    Cardiovascular No symptoms reported    Respiratory  (Rhinits/Reports currently well controlled with Flonase Spray and loratadine.) Rhinits/Reports currently well controlled with Flonase Spray and loratadine.  Endocrine No symptoms reported    Gastrointestinal Constipation (Reports episodes of constipation which is being well controlled with Dulcolax as needed)    Genitourinary No symptoms reported    Integumentary No symptoms reported    Musculoskeletal Difficulty walking, Weakness, Other (Reports of muscle spasms and sciatica that is currently being managed with medication. Confirmed completing initial evaluation Physical Therapy on 03/12/24 and will start therapy at [facility] on 03/18/24)   Psychosocial Depression - if selected complete PHQ 2-9, Anxiety - if selected complete GAD (Reports symptoms are due to decreased mobility/chronic sciatica. Reports pain medications were adjusted and she will start PT Rehabilitation at [facility] on 03/18/24. She is hopeful that her mobility will improve. Declined current need for counseling services.)     Vitals:   BP  Readings from Last 3 Encounters:  02/26/24 106/62  02/18/24 121/81  01/26/24 120/64     Medications Reviewed Today     Reviewed by Juanell Fairly, RN (Registered Nurse) on 03/13/24 at 1622  Med List Status: <None>   Medication Order Taking? Sig Documenting Provider Last Dose Status Informant  amLODipine (NORVASC) 5 MG tablet [id number] Yes Take 1 tablet (5 mg total) by mouth daily. [name], DO Taking Active   busPIRone (BUSPAR) 5 MG tablet [id number] Yes TAKE 1 TABLET BY MOUTH 3 TIMES A DAY AS NEEDED FOR ANXIETY [name], DO Taking Active   escitalopram (LEXAPRO) 20 MG tablet [id number] Yes TAKE 1 TABLET BY MOUTH AT BEDTIME [name], DO Taking Active   fluticasone (FLONASE) 50 MCG/ACT nasal spray [id number] Yes Place 1 spray into both nostrils daily. [provider] Taking Active Self  oxyCODONE-acetaminophen (PERCOCET) 7.5-325 MG tablet [id number] No Take 1 tablet by mouth every 4 (four) hours as needed for severe pain (pain score 7-10).  Patient not taking: Reported on 03/13/2024   [name], DO Not Taking Consider Medication Status and Discontinue (Patient Preference)   predniSONE (STERAPRED UNI-PAK 21 TAB) 10 MG (21) TBPK tablet [id number] No Take 6 tablets on the first day and decrease by 1 tablet each day until finished.  Patient not taking: Reported on 03/13/2024   Chinita Pester, FNP Not Taking Consider Medication Status and Discontinue (Completed Course)   rosuvastatin (CRESTOR) 5 MG tablet [id number] Yes Take 1 tablet (5 mg total) by mouth every other day. [name], DO Taking Active  tiZANidine (ZANAFLEX) 4 MG tablet [id number] Yes Take 1 tablet (4 mg total) by mouth 3 (three) times daily. Kem Boroughs B, FNP Taking Active   triamcinolone ointment (KENALOG) 0.1 % [id number] Yes Apply 1 Application topically 2 (two) times daily. [name], DO Taking Active              Recommendation:   Continue plan for Physical Therapy at  [facility].   Follow Up Plan:   Telephone follow up scheduled for Apr 30, 2024 at 3:30.   Juanell Fairly [facility] Population Health RN Care Manager Direct Dial: [phone]  Fax: [phone] Website: [email or web address]

## 2024-03-13 NOTE — Patient Instructions (Signed)
 Visit Information Thank you for allowing the Care Management team to participate in your care. Thank you for taking time to visit with me today.   Your next care management appointment is by telephone on Apr 30, 2024 at 3:30 pm Please do not hesitate to contact me if you require assistance prior to our next outreach.     Please {[email or web address]:[id number]} if you are experiencing a Mental Health or Behavioral Health Crisis or need someone to talk to.  SIGNATURE***

## 2024-03-18 ENCOUNTER — Ambulatory Visit

## 2024-03-18 ENCOUNTER — Telehealth: Payer: Self-pay

## 2024-03-18 ENCOUNTER — Other Ambulatory Visit: Payer: Self-pay | Admitting: Family Medicine

## 2024-03-18 DIAGNOSIS — M5459 Other low back pain: Secondary | ICD-10-CM | POA: Diagnosis not present

## 2024-03-18 DIAGNOSIS — M6281 Muscle weakness (generalized): Secondary | ICD-10-CM | POA: Diagnosis not present

## 2024-03-18 DIAGNOSIS — G8929 Other chronic pain: Secondary | ICD-10-CM

## 2024-03-18 DIAGNOSIS — M5416 Radiculopathy, lumbar region: Secondary | ICD-10-CM | POA: Diagnosis not present

## 2024-03-18 DIAGNOSIS — R2689 Other abnormalities of gait and mobility: Secondary | ICD-10-CM

## 2024-03-18 MED ORDER — OXYCODONE-ACETAMINOPHEN 7.5-325 MG PO TABS: 1.0000 | ORAL_TABLET | ORAL | 0 refills | Status: DC | PRN

## 2024-03-18 NOTE — Telephone Encounter (Signed)
 Please notify her that rx re ordered for pain medicine.  Yes it is only 5 day supply according to insurance coverage. She would need to follow-up again on this with me for future discussion on dosing. We were only ordering short term until she saw Neurosurgery.  Domingo Friend, DO [facility] 03/18/2024, 1:08 PM

## 2024-03-18 NOTE — Telephone Encounter (Signed)
 Patient called states she was unable to request refill through [facility] but she did submit on MyChart. She only has one pill left and she needs to take that today before physical therapy. Show the request did go through and has been sent to provider. States last fill was only a 5 day supply. Thank You

## 2024-03-18 NOTE — Telephone Encounter (Signed)
 I have already refilled this and responded. Thank you!  Domingo Friend, DO [facility] 03/18/2024, 1:20 PM

## 2024-03-18 NOTE — Telephone Encounter (Signed)
 Copied from CRM [id number]. Topic: Clinical - Prescription Issue >> Mar 18, 2024  1:04 PM Crispin Dolphin wrote: Reason for CRM: Patient called states she was unable to request refill through [facility] but she did submit on MyChart. She only has one pill left and she needs to take that today before physical therapy. Show the request did go through and has been sent to provider. States last fill was only a 5 day supply. Thank You

## 2024-03-18 NOTE — Therapy (Signed)
 OUTPATIENT PHYSICAL THERAPY THORACOLUMBAR TREATMENT   Patient Name: Cheryl Payne MRN: [id number] DOB:1966/06/21, 58 y.o., female Today's Date: 03/18/2024  END OF SESSION:  PT End of Session - 03/18/24 1558     Visit Number 2    Number of Visits 25    Date for PT Re-Evaluation 06/04/24    Progress Note Due on Visit 10    PT Start Time 1600    PT Stop Time 1640    PT Time Calculation (min) 40 min    Activity Tolerance Patient tolerated treatment well    Behavior During Therapy [facility] for tasks assessed/performed              Past Medical History:  Diagnosis Date   Anxiety NA   Arthritis    Blood transfusion without reported diagnosis 2023   When i was in the hospital   Cancer [facility])    lung   Chronic left shoulder pain    Depression    GERD (gastroesophageal reflux disease)    Hypertension    Ulcer 2023   When i was in the hospital   Past Surgical History:  Procedure Laterality Date   COLONOSCOPY WITH PROPOFOL N/A 03/24/2023   Procedure: COLONOSCOPY WITH PROPOFOL;  Surgeon: Luke Salaam, MD;  Location: [facility] ENDOSCOPY;  Service: Gastroenterology;  Laterality: N/A;   ESOPHAGOGASTRODUODENOSCOPY (EGD) WITH PROPOFOL N/A 08/17/2022   Procedure: ESOPHAGOGASTRODUODENOSCOPY (EGD) WITH PROPOFOL;  Surgeon: Luke Salaam, MD;  Location: [facility] ENDOSCOPY;  Service: Gastroenterology;  Laterality: N/A;   ESOPHAGOGASTRODUODENOSCOPY (EGD) WITH PROPOFOL N/A 03/24/2023   Procedure: ESOPHAGOGASTRODUODENOSCOPY (EGD) WITH PROPOFOL;  Surgeon: Luke Salaam, MD;  Location: [facility] ENDOSCOPY;  Service: Gastroenterology;  Laterality: N/A;   LUNG SURGERY     Patient Active Problem List   Diagnosis Date Noted   Blood on toilet paper 03/24/2023   Adenomatous polyp of colon 03/24/2023   Gastric ulcer 03/24/2023   GI bleed 08/17/2022   Upper GI bleed 08/16/2022   Leukocytosis 08/16/2022   Chronic left-sided low back pain with left-sided sciatica 09/21/2021   Pure hypercholesterolemia 07/13/2020    Major depressive disorder, recurrent, in partial remission (HCC) 07/13/2020   GAD (generalized anxiety disorder) 07/13/2020   Encounter for screening mammogram for malignant neoplasm of breast 07/13/2020   Muscle spasm of left shoulder 07/13/2020   Chronic left shoulder pain 07/13/2020   Centrilobular emphysema (HCC) 07/13/2020   Moderate aortic valve insufficiency 07/13/2020   Obesity (BMI 30.0-34.9) 07/13/2020   DOE (dyspnea on exertion) 01/10/2020   Non-small cell carcinoma of lung (HCC) 01/02/2019   Cancer of upper lobe of right lung (HCC) 11/15/2018   Lung mass 10/02/2018   Essential hypertension 09/02/2014    PCP: Raina Bunting, DO  REFERRING PROVIDER: Ludwig Safer, PA-C  REFERRING DIAG:  [phone] (ICD-10-CM) - Lumbar radiculopathy    RATIONALE FOR EVALUATION AND TREATMENT: Rehabilitation  THERAPY DIAG: Other low back pain  Muscle weakness (generalized)  Other abnormalities of gait and mobility  ONSET DATE: Chronic (2019)  FOLLOW-UP APPT SCHEDULED WITH REFERRING PROVIDER: Yes    SUBJECTIVE:  SUBJECTIVE STATEMENT:  Chronic Low Back Pain   PERTINENT HISTORY:   Patient reports that she has had lower back pain since 2019. She reports that she has seen her provider and received multiple steroid injections with little relief.  Patient reports that pain starts in the lower back and refers down the posterior side of the L leg and ends the toes. Numbness and tingling are present in the posterior thigh and plantar aspect of the left foot. No relief from recent prescription (oxycodone, tremedol, hydrocodone). Patient states that the pain intensifies with prolonged walking, sitting or standing. She reports that she spends most of the day on the floor or lying down due to the radiating  pain. She reports that pain in the L leg that disrupts her sleep throughout the night. Denies Saddle Anaesthesia, loss b/b, fever/chills.   Imaging Per Chart Review (02/26/2024):   EXAM: LUMBAR SPINE - COMPLETE 4+ VIEW   COMPARISON:  MRI lumbar spine January 18, 2024   FINDINGS: Minimal anterior osteophytic changes involving the L3-4 disc space. There is preservation of the height of the lumbar vertebral bodies and intervertebral disc spaces   No spondylolysis or listhesis   IMPRESSION: Minimal degenerative changes  PAIN:    Pain Intensity: Present: 9/10, Best: 5/10, Worst: 10/10 Pain location: lower back, Left posterior thigh and feet  Pain Quality: sharp and stabbing  Radiating: Yes  Numbness/Tingling: Yes Focal Weakness: No Aggravating factors: Prolonged walking, standing, sitting  Relieving factors: Rest, lying on back,  How long can you sit: < 10 min  How long can you stand: < 10 min  History of prior back injury, pain, surgery, or therapy: Yes Dominant hand: right  PRECAUTIONS: Fall  WEIGHT BEARING RESTRICTIONS: No  FALLS: Has patient fallen in last 6 months? No  Living Environment Lives with: Alone  Lives in: House/apartment Stairs: No Has following equipment at home: None  Prior level of function: Independent  Occupational demands: Retired   Presenter, broadcasting: Engineer, water, Cooking   Patient Goals: Relieve Pain and return PLOF    OBJECTIVE:  Patient Surveys    Cognition Patient is oriented to person, place, and time.  Attention span and concentration are intact.  Expressive speech is intact.     Gross Musculoskeletal Assessment Tremor: None Bulk: Normal Tone: Normal No visible step-off along spinal column, no signs of scoliosis  GAIT: Distance walked: 63m Assistive device utilized: None Level of assistance: Complete Independence Comments: Antalgic, Narrow Step Width, bilateral knee valgus present  Posture: Lumbar lordosis: WNL Iliac crest  height: Equal bilaterally Lumbar lateral shift: Negative  AROM AROM (Normal range in degrees) AROM   Lumbar   Flexion (65) 85% limited due to hamstring tightness  Extension (30) 100%  Right lateral flexion (25) 100%  Left lateral flexion (25) 100%  Right rotation (30) 100%  Left rotation (30) 100%      Hip Right Left  Flexion (125) WNL WNL  Extension (15)    Abduction (40)    Adduction     Internal Rotation (45) 30 30  External Rotation (45) 30 20*      Knee    Flexion (135) WNL WNL  Extension (0) WNL WNL      Ankle    Dorsiflexion (20)    Plantarflexion (50)    Inversion (35)    Eversion (15)    (* = pain; Blank rows = not tested)  LE MMT: MMT (out of 5) Right  Left   Hip flexion 3+ 3+  Hip extension    Hip abduction 4 3+  Hip adduction    Hip internal rotation    Hip external rotation    Knee flexion 4+ 4-  Knee extension 4+ 4-  Ankle dorsiflexion    Ankle plantarflexion    Ankle inversion    Ankle eversion    (* = pain; Blank rows = not tested)  Sensation Grossly intact to light touch throughout bilateral LEs as determined by testing dermatomes.   Intermittent sharp pain noted in R LE  in L5-S1 dermatomal pattern.  Reflexes Not Test  Muscle Length Hamstrings: R: Negative 20 L: Positive: 40 with reproduction of pain  Palpation Location Right Left         Lumbar paraspinals 0 0  Quadratus Lumborum 0 0  Gluteus Maximus 1 1  Gluteus Medius 1 1  Deep hip external rotators 1 2  PSIS    Fortin's Area (SIJ)    Greater Trochanter 1 2  (Blank rows = not tested) Graded on 0-4 scale (0 = no pain, 1 = pain, 2 = pain with wincing/grimacing/flinching, 3 = pain with withdrawal, 4 = unwilling to allow palpation)  Passive Accessory Intervertebral Motion Pt reports reproduction of back pain with CPA L4-S1 segments with no improvements following prolonged CPA.   Special Tests Lumbar Radiculopathy and Discogenic: Centralization and Peripheralization  (SN 92, -LR 0.12): Not Tested  Slump (SN 83, -LR 0.32): Not Tested SLR (SN 92, -LR 0.29): Not Tested  Hip: FABER (SN 81): R: Negative L: Negative FADIR (SN 94): R: Negative L: Negative  Functional Tasks  Deep squat: Narrow BOS, Hip hinge absent, decreased ankle dorsiflexion   Sit to stand: Use of BUE support, minor knee valgus present  Forward Step-Down Test: R: Pain with Knee Valgus present   TODAY'S TREATMENT: DATE: 03/18/2024   Subjective: Patient reports 5/10 in the L posterior thigh and L heel at start of session. Cooking was difficult yesterday and she had to use computer desk chair in order to move in the kitchen. No questions or concerns.   There.ex:   Reviewed HEP with return demonstration from patient:    Supine Figure 4 Piriformis Stretch 1 x 30s ea leg (PT OP);    Supine Single Knee to Chest Stretch 1 x 30s ea leg;  Supine Hamstring Stretch with ankle strap: 2 x 30s ea leg  Bridge with Hip Abduction and Resistance in order to improve hip and core stabilization:    1 x 10 Red TB  1 x 10 Green TB    Sidelying Clam with Resistance:  2 x 10 ea leg with Green TB   PT Demo with pt return demonstration on Supine Sciatic Nerve Glides (LLE only):   1 x 10   Neutral Crunch with RLE straight in order to improve core stabilization:  2 x 5 x 10s   mODI discussed during rest breaks in between sets:  03/18/2024: 31 / 50 = 62.0 %  There.act:    Partial Squats with Green TB around lower leg in order to engage deep glutes   2 x 8, SBA  Multimodal cues for proper hip hinge mechanics; tactile and verbal for increased hip flexion.    5TSTS: 13.47s (Age related norm - 7.72.6)   PATIENT EDUCATION:  Education details: HEP, Exercises  Person educated: Patient Education method: Explanation, Actor cues, Verbal cues, and Handouts Education comprehension: verbalized understanding, returned demonstration, and needs further education   HOME EXERCISE PROGRAM:  Access  Code: [email or web address]  URL: [email or web address] Date: 03/12/2024 Prepared by: Veryl Gottron Emanuell Morina  Exercises - Supine Figure 4 Piriformis Stretch  - 1 x daily - 7 x weekly - 3 sets - 30-60 hold - Supine Single Knee to Chest Stretch  - 1 x daily - 7 x weekly - 3 sets - 30-60 hold - Seated Hamstring Stretch  - 1 x daily - 7 x weekly - 3 sets - 30-60 hold - Bridge with Hip Abduction and Resistance  - 1 x daily - 3-4 x weekly - 2-3 sets - 10-12 reps - Clam with Resistance  - 1 x daily - 3-4 x weekly - 2-3 sets - 10-12 reps   ASSESSMENT:  CLINICAL IMPRESSION: Patient arrived to OPPT for initial follow up main focus on core stabilization and hip strengthening. Pt tolerated today's interventions without report of additional pain in lumbar spine. Pt endorsed moderate to severe disability as indicated on her mODI (31/50=62%) score today. Pt also demonstrates decreased LE strength as indicated on her 5TSTS (13.47s - 7.72.6 sec Age related norms). Today Pt able to perform proper hip hinge mechanics with deep squat, some VC for hip flexion provided from PT with good return demonstration. Educated pt on supine nerve glides and hamstring stretching in order to reduce neural tension in LLE. Pt's deficits are still significantly limiting sleep, standing, ability to lift items. Based on today's performance, patient will continue to benefit from skilled PT in order to address current deficits and facilitate return to PLOF.  Based on today's performance, current POC remains appropriate and pt will continue to benefit from skilled physical therapy focused on improving core stabilization and maximizing return to PLOF.    OBJECTIVE IMPAIRMENTS: Abnormal gait, decreased activity tolerance, difficulty walking, decreased ROM, decreased strength, and pain.   ACTIVITY LIMITATIONS: carrying, bending, sitting, standing, squatting, and sleeping  PARTICIPATION LIMITATIONS: driving, shopping, and community  activity  PERSONAL FACTORS: Age, Social background, and Time since onset of injury/illness/exacerbation are also affecting patient's functional outcome.   REHAB POTENTIAL: Good  CLINICAL DECISION MAKING: Evolving/moderate complexity  EVALUATION COMPLEXITY: Moderate   GOALS: Goals reviewed with patient? No  SHORT TERM GOALS: Target date: 04/29/2024  Pt will be independent with HEP in order to improve strength and decrease back pain to improve pain-free function at home and work. Baseline: n/a Goal status: INITIAL   LONG TERM GOALS: Target date: 06/10/2024  Pt will be able to sit for 30-60 minutes without reporting discomfort in order to demonstrate significant improvements in household ADLs and return to PLOF.  Baseline: n/a  Goal status: INITIAL  2.  Pt will decrease worst back pain by at least 2 points on the NPRS in order to demonstrate clinically significant reduction in back pain. Baseline: 03/12/2024: 10/10 NPS Goal status: INITIAL  3.  Pt will decrease mODI score by at least 13 points in order demonstrate clinically significant reduction in back pain/disability.     Baseline: 03/18/2024: 31 / 50 = 62.0 % Goal status: INITIAL  4.  Pt will increase L Hip MMT by 1 grade in order to demonstrate clinically significant improvements in LE strength.  Baseline: 03/12/2024 L Hip Flex: 3+/5, Abd: 3+/5 Goal status: INITIAL  5.  Pt will improve 5xSTS to 12 seconds or less to demonstrate clinically significant improvement in LE strength and ability to transfer pain free Baseline: 03/18/2024: 13.47s (7.72.6 sec) Goal status: INITIAL    PLAN: PT FREQUENCY: 1-2x/week  PT DURATION: 12 weeks  PLANNED INTERVENTIONS: Therapeutic exercises, Therapeutic activity, Neuromuscular re-education,  Balance training, Gait training, Patient/Family education, Self Care, Joint mobilization, Joint manipulationOrthotic/Fit training, DME instructions, Dry Needling, Electrical stimulation, Spinal  manipulation, Spinal mobilization, Cryotherapy, Moist heat, Taping, Traction, Ultrasound, Manual therapy, and Re-evaluation.  PLAN FOR NEXT SESSION: Progress Hip Strength and core stabilization, flexion based exercise   Satira Curet PT, DPT 03/18/2024, 7:43 PM

## 2024-03-22 ENCOUNTER — Other Ambulatory Visit: Payer: Self-pay | Admitting: Family Medicine

## 2024-03-22 DIAGNOSIS — G8929 Other chronic pain: Secondary | ICD-10-CM

## 2024-03-22 DIAGNOSIS — M5416 Radiculopathy, lumbar region: Secondary | ICD-10-CM

## 2024-03-22 MED ORDER — OXYCODONE-ACETAMINOPHEN 10-325 MG PO TABS: 1.0000 | ORAL_TABLET | ORAL | 0 refills | Status: DC | PRN

## 2024-03-22 MED ORDER — GABAPENTIN 300 MG PO CAPS: ORAL_CAPSULE | ORAL | 1 refills | Status: DC

## 2024-03-22 NOTE — Telephone Encounter (Signed)
 Called patient.  Her Oxycodone  is not lasting long enough only 1-2 hr per pill, and she is taking 4-6 pills per day, only lasting 5 day supply. I advised her that I can dose increase to 10mg  is my max I can offer here, and keep pill count same. She is just starting PT per Neurosurgery considering Surgery. They cannot order pain medicine.   She stopped Lyrica  and prefers restart Gabapentin  300mg  1 in AM and 2 in PM. New order sent.  Also she requested DME Shower Stool. Fax to  [facility] [address] [place], [place]  [address] Ph: [phone] Fax: [phone]  Domingo Friend, DO [facility] 03/22/2024, 9:03 AM

## 2024-03-26 ENCOUNTER — Ambulatory Visit

## 2024-03-26 DIAGNOSIS — M5459 Other low back pain: Secondary | ICD-10-CM

## 2024-03-26 DIAGNOSIS — M6281 Muscle weakness (generalized): Secondary | ICD-10-CM | POA: Diagnosis not present

## 2024-03-26 DIAGNOSIS — R2689 Other abnormalities of gait and mobility: Secondary | ICD-10-CM | POA: Diagnosis not present

## 2024-03-26 DIAGNOSIS — M5416 Radiculopathy, lumbar region: Secondary | ICD-10-CM | POA: Diagnosis not present

## 2024-03-26 NOTE — Therapy (Signed)
 OUTPATIENT PHYSICAL THERAPY THORACOLUMBAR TREATMENT   Patient Name: Cheryl Payne MRN: [id number] DOB:Dec 27, 1965, 58 y.o., female Today's Date: 03/26/2024  END OF SESSION:  PT End of Session - 03/26/24 1732     Visit Number 3    Number of Visits 25    Date for PT Re-Evaluation 06/04/24    Progress Note Due on Visit 10    PT Start Time 1730    PT Stop Time 1810    PT Time Calculation (min) 40 min    Activity Tolerance Patient tolerated treatment well    Behavior During Therapy [facility] for tasks assessed/performed              Past Medical History:  Diagnosis Date   Anxiety NA   Arthritis    Blood transfusion without reported diagnosis 2023   When i was in the hospital   Cancer [facility])    lung   Chronic left shoulder pain    Depression    GERD (gastroesophageal reflux disease)    Hypertension    Ulcer 2023   When i was in the hospital   Past Surgical History:  Procedure Laterality Date   COLONOSCOPY WITH PROPOFOL  N/A 03/24/2023   Procedure: COLONOSCOPY WITH PROPOFOL ;  Surgeon: Luke Salaam, MD;  Location: [facility] ENDOSCOPY;  Service: Gastroenterology;  Laterality: N/A;   ESOPHAGOGASTRODUODENOSCOPY (EGD) WITH PROPOFOL  N/A 08/17/2022   Procedure: ESOPHAGOGASTRODUODENOSCOPY (EGD) WITH PROPOFOL ;  Surgeon: Luke Salaam, MD;  Location: [facility] ENDOSCOPY;  Service: Gastroenterology;  Laterality: N/A;   ESOPHAGOGASTRODUODENOSCOPY (EGD) WITH PROPOFOL  N/A 03/24/2023   Procedure: ESOPHAGOGASTRODUODENOSCOPY (EGD) WITH PROPOFOL ;  Surgeon: Luke Salaam, MD;  Location: [facility] ENDOSCOPY;  Service: Gastroenterology;  Laterality: N/A;   LUNG SURGERY     Patient Active Problem List   Diagnosis Date Noted   Blood on toilet paper 03/24/2023   Adenomatous polyp of colon 03/24/2023   Gastric ulcer 03/24/2023   GI bleed 08/17/2022   Upper GI bleed 08/16/2022   Leukocytosis 08/16/2022   Chronic left-sided low back pain with left-sided sciatica 09/21/2021   Pure hypercholesterolemia 07/13/2020    Major depressive disorder, recurrent, in partial remission (HCC) 07/13/2020   GAD (generalized anxiety disorder) 07/13/2020   Encounter for screening mammogram for malignant neoplasm of breast 07/13/2020   Muscle spasm of left shoulder 07/13/2020   Chronic left shoulder pain 07/13/2020   Centrilobular emphysema (HCC) 07/13/2020   Moderate aortic valve insufficiency 07/13/2020   Obesity (BMI 30.0-34.9) 07/13/2020   DOE (dyspnea on exertion) 01/10/2020   Non-small cell carcinoma of lung (HCC) 01/02/2019   Cancer of upper lobe of right lung (HCC) 11/15/2018   Lung mass 10/02/2018   Essential hypertension 09/02/2014    PCP: Raina Bunting, DO  REFERRING PROVIDER: Ludwig Safer, PA-C  REFERRING DIAG:  M54.16 (ICD-10-CM) - Lumbar radiculopathy    RATIONALE FOR EVALUATION AND TREATMENT: Rehabilitation  THERAPY DIAG: Other low back pain  Muscle weakness (generalized)  ONSET DATE: Chronic (2019)  FOLLOW-UP APPT SCHEDULED WITH REFERRING PROVIDER: Yes    SUBJECTIVE:  SUBJECTIVE STATEMENT:  Chronic Low Back Pain   PERTINENT HISTORY:   Patient reports that she has had lower back pain since 2019. She reports that she has seen her provider and received multiple steroid injections with little relief.  Patient reports that pain starts in the lower back and refers down the posterior side of the L leg and ends the toes. Numbness and tingling are present in the posterior thigh and plantar aspect of the left foot. No relief from recent prescription (oxycodone , tremedol, hydrocodone ). Patient states that the pain intensifies with prolonged walking, sitting or standing. She reports that she spends most of the day on the floor or lying down due to the radiating pain. She reports that pain in the L leg that  disrupts her sleep throughout the night. Denies Saddle Anaesthesia, loss b/b, fever/chills.   Imaging Per Chart Review (02/26/2024):   EXAM: LUMBAR SPINE - COMPLETE 4+ VIEW   COMPARISON:  MRI lumbar spine January 18, 2024   FINDINGS: Minimal anterior osteophytic changes involving the L3-4 disc space. There is preservation of the height of the lumbar vertebral bodies and intervertebral disc spaces   No spondylolysis or listhesis   IMPRESSION: Minimal degenerative changes  PAIN:    Pain Intensity: Present: 9/10, Best: 5/10, Worst: 10/10 Pain location: lower back, Left posterior thigh and feet  Pain Quality: sharp and stabbing  Radiating: Yes  Numbness/Tingling: Yes Focal Weakness: No Aggravating factors: Prolonged walking, standing, sitting  Relieving factors: Rest, lying on back,  How long can you sit: < 10 min  How long can you stand: < 10 min  History of prior back injury, pain, surgery, or therapy: Yes Dominant hand: right  PRECAUTIONS: Fall  WEIGHT BEARING RESTRICTIONS: No  FALLS: Has patient fallen in last 6 months? No  Living Environment Lives with: Alone  Lives in: House/apartment Stairs: No Has following equipment at home: None  Prior level of function: Independent  Occupational demands: Retired   Presenter, broadcasting: Engineer, water, Cooking   Patient Goals: Relieve Pain and return PLOF    OBJECTIVE:  Patient Surveys    Cognition Patient is oriented to person, place, and time.  Attention span and concentration are intact.  Expressive speech is intact.     Gross Musculoskeletal Assessment Tremor: None Bulk: Normal Tone: Normal No visible step-off along spinal column, no signs of scoliosis  GAIT: Distance walked: 37m Assistive device utilized: None Level of assistance: Complete Independence Comments: Antalgic, Narrow Step Width, bilateral knee valgus present  Posture: Lumbar lordosis: WNL Iliac crest height: Equal bilaterally Lumbar lateral shift:  Negative  AROM AROM (Normal range in degrees) AROM   Lumbar   Flexion (65) 85% limited due to hamstring tightness  Extension (30) 100%  Right lateral flexion (25) 100%  Left lateral flexion (25) 100%  Right rotation (30) 100%  Left rotation (30) 100%      Hip Right Left  Flexion (125) WNL WNL  Extension (15)    Abduction (40)    Adduction     Internal Rotation (45) 30 30  External Rotation (45) 30 20*      Knee    Flexion (135) WNL WNL  Extension (0) WNL WNL      Ankle    Dorsiflexion (20)    Plantarflexion (50)    Inversion (35)    Eversion (15)    (* = pain; Blank rows = not tested)  LE MMT: MMT (out of 5) Right  Left   Hip flexion 3+ 3+  Hip extension    Hip abduction 4 3+  Hip adduction    Hip internal rotation    Hip external rotation    Knee flexion 4+ 4-  Knee extension 4+ 4-  Ankle dorsiflexion    Ankle plantarflexion    Ankle inversion    Ankle eversion    (* = pain; Blank rows = not tested)  Sensation Grossly intact to light touch throughout bilateral LEs as determined by testing dermatomes.   Intermittent sharp pain noted in R LE  in L5-S1 dermatomal pattern.  Reflexes Not Test  Muscle Length Hamstrings: R: Negative 20 L: Positive: 40 with reproduction of pain  Palpation Location Right Left         Lumbar paraspinals 0 0  Quadratus Lumborum 0 0  Gluteus Maximus 1 1  Gluteus Medius 1 1  Deep hip external rotators 1 2  PSIS    Fortin's Area (SIJ)    Greater Trochanter 1 2  (Blank rows = not tested) Graded on 0-4 scale (0 = no pain, 1 = pain, 2 = pain with wincing/grimacing/flinching, 3 = pain with withdrawal, 4 = unwilling to allow palpation)  Passive Accessory Intervertebral Motion Pt reports reproduction of back pain with CPA L4-S1 segments with no improvements following prolonged CPA.   Special Tests Lumbar Radiculopathy and Discogenic: Centralization and Peripheralization (SN 92, -LR 0.12): Not Tested  Slump (SN 83, -LR  0.32): Not Tested SLR (SN 92, -LR 0.29): Not Tested  Hip: FABER (SN 81): R: Negative L: Negative FADIR (SN 94): R: Negative L: Negative  Functional Tasks  Deep squat: Narrow BOS, Hip hinge absent, decreased ankle dorsiflexion   Sit to stand: Use of BUE support, minor knee valgus present  Forward Step-Down Test: R: Pain with Knee Valgus present   TODAY'S TREATMENT: DATE: 03/26/2024   Subjective: Patient reports 4/10 in the L sarcral/PSIS and upper gluteal region. Patient finds herself sitting on the couch for longer periods; able to withstand prolonged sitting.  No questions or concerns.   There.ex:   Passive L Crossover Stretch with PT OP:  30s/bout x 3 in order to improve ROM and tissue extensibility  Supine Bridges with TrA activation   1 x 12   Supine Single Leg Bridge with TrA activation  2 x 10 ea leg   Dead bug Progression:   1 x 5 x 10s Static position hold   1 x 10 alternating LE    1 x 10 alternating UE  Bird Dog   1 x 5 x 3s  VC for neutral spine alignment, CGA provided in order to maintain position  Pallof Press @ OMEGA   1 x 10 5#, resistance from L   1 x 10 5#, resistance from R   Manual Therapy (10 min billed):  Light STM at L glute max, glue medius and L paraspinals in order to reduce tension and for pain modulation. Myofascial techniques, ptrissage, and trigger point release techniques utilized.   Sacral Mobilization x 30s/bout x 3 bouts x grade III in order to improve sacral mobility and reduce pain.    PATIENT EDUCATION:  Education details: HEP, Exercises  Person educated: Patient Education method: Explanation, Actor cues, Verbal cues, and Handouts Education comprehension: verbalized understanding, returned demonstration, and needs further education   HOME EXERCISE PROGRAM:  Access Code: [email or web address] URL: [email or web address] Date: 03/12/2024 Prepared by: Veryl Gottron Ezekiah Massie  Exercises - Supine Figure 4 Piriformis Stretch  - 1  x daily - 7 x weekly - 3  sets - 30-60 hold - Supine Single Knee to Chest Stretch  - 1 x daily - 7 x weekly - 3 sets - 30-60 hold - Seated Hamstring Stretch  - 1 x daily - 7 x weekly - 3 sets - 30-60 hold - Bridge with Hip Abduction and Resistance  - 1 x daily - 3-4 x weekly - 2-3 sets - 10-12 reps - Clam with Resistance  - 1 x daily - 3-4 x weekly - 2-3 sets - 10-12 reps   ASSESSMENT:  CLINICAL IMPRESSION: Pt arrived to OPPT with continued focus on improving core stabilization. Pt highly motivated and with good participation throughout today's session. Pt tolerated increase in intensity with core stabilization exercises. PT performed manual techniques to lumbar paraspinals and gluteal musculature for pain modulation. Pt endorsed improvement in pain at end of session. Current POC remains appropriate to address patient's remaining deficits. Pt will continue to benefit from skilled physical therapy focused on improving core stabilization and gluteal strength in order to maximize return to PLOF.   OBJECTIVE IMPAIRMENTS: Abnormal gait, decreased activity tolerance, difficulty walking, decreased ROM, decreased strength, and pain.   ACTIVITY LIMITATIONS: carrying, bending, sitting, standing, squatting, and sleeping  PARTICIPATION LIMITATIONS: driving, shopping, and community activity  PERSONAL FACTORS: Age, Social background, and Time since onset of injury/illness/exacerbation are also affecting patient's functional outcome.   REHAB POTENTIAL: Good  CLINICAL DECISION MAKING: Evolving/moderate complexity  EVALUATION COMPLEXITY: Moderate   GOALS: Goals reviewed with patient? No  SHORT TERM GOALS: Target date: 05/07/2024  Pt will be independent with HEP in order to improve strength and decrease back pain to improve pain-free function at home and work. Baseline: n/a Goal status: INITIAL   LONG TERM GOALS: Target date: 06/18/2024  Pt will be able to sit for 30-60 minutes without reporting  discomfort in order to demonstrate significant improvements in household ADLs and return to PLOF.  Baseline: n/a  Goal status: INITIAL  2.  Pt will decrease worst back pain by at least 2 points on the NPRS in order to demonstrate clinically significant reduction in back pain. Baseline: 03/12/2024: 10/10 NPS Goal status: INITIAL  3.  Pt will decrease mODI score by at least 13 points in order demonstrate clinically significant reduction in back pain/disability.     Baseline: 03/18/2024: 31 / 50 = 62.0 % Goal status: INITIAL  4.  Pt will increase L Hip MMT by 1 grade in order to demonstrate clinically significant improvements in LE strength.  Baseline: 03/12/2024 L Hip Flex: 3+/5, Abd: 3+/5 Goal status: INITIAL  5.  Pt will improve 5xSTS to 12 seconds or less to demonstrate clinically significant improvement in LE strength and ability to transfer pain free Baseline: 03/18/2024: 13.47s (7.72.6 sec) Goal status: INITIAL    PLAN: PT FREQUENCY: 1-2x/week  PT DURATION: 12 weeks  PLANNED INTERVENTIONS: Therapeutic exercises, Therapeutic activity, Neuromuscular re-education, Balance training, Gait training, Patient/Family education, Self Care, Joint mobilization, Joint manipulationOrthotic/Fit training, DME instructions, Dry Needling, Electrical stimulation, Spinal manipulation, Spinal mobilization, Cryotherapy, Moist heat, Taping, Traction, Ultrasound, Manual therapy, and Re-evaluation.  PLAN FOR NEXT SESSION: Progress Hip Strength and core stabilization, manual techniques PRN    Satira Curet PT, DPT 03/26/2024, 5:33 PM

## 2024-03-29 ENCOUNTER — Other Ambulatory Visit: Payer: Self-pay | Admitting: Family Medicine

## 2024-03-29 DIAGNOSIS — M5416 Radiculopathy, lumbar region: Secondary | ICD-10-CM

## 2024-03-29 DIAGNOSIS — G8929 Other chronic pain: Secondary | ICD-10-CM

## 2024-03-29 MED ORDER — OXYCODONE-ACETAMINOPHEN 10-325 MG PO TABS: 1.0000 | ORAL_TABLET | ORAL | 0 refills | Status: DC | PRN

## 2024-04-02 ENCOUNTER — Ambulatory Visit

## 2024-04-02 DIAGNOSIS — M5459 Other low back pain: Secondary | ICD-10-CM

## 2024-04-02 DIAGNOSIS — R2689 Other abnormalities of gait and mobility: Secondary | ICD-10-CM

## 2024-04-02 DIAGNOSIS — M6281 Muscle weakness (generalized): Secondary | ICD-10-CM | POA: Diagnosis not present

## 2024-04-02 DIAGNOSIS — M5416 Radiculopathy, lumbar region: Secondary | ICD-10-CM | POA: Diagnosis not present

## 2024-04-02 NOTE — Therapy (Signed)
 OUTPATIENT PHYSICAL THERAPY THORACOLUMBAR TREATMENT   Patient Name: Cheryl Payne MRN: [id number] DOB:February 21, 1966, 58 y.o., female Today's Date: 04/02/2024  END OF SESSION:  PT End of Session - 04/02/24 1642     Visit Number 4    Number of Visits 25    Date for PT Re-Evaluation 06/04/24    Progress Note Due on Visit 10    PT Start Time 1640    PT Stop Time 1720    PT Time Calculation (min) 40 min    Activity Tolerance Patient tolerated treatment well    Behavior During Therapy [facility] for tasks assessed/performed              Past Medical History:  Diagnosis Date   Anxiety NA   Arthritis    Blood transfusion without reported diagnosis 2023   When i was in the hospital   Cancer [facility])    lung   Chronic left shoulder pain    Depression    GERD (gastroesophageal reflux disease)    Hypertension    Ulcer 2023   When i was in the hospital   Past Surgical History:  Procedure Laterality Date   COLONOSCOPY WITH PROPOFOL  N/A 03/24/2023   Procedure: COLONOSCOPY WITH PROPOFOL ;  Surgeon: Luke Salaam, MD;  Location: [facility] ENDOSCOPY;  Service: Gastroenterology;  Laterality: N/A;   ESOPHAGOGASTRODUODENOSCOPY (EGD) WITH PROPOFOL  N/A 08/17/2022   Procedure: ESOPHAGOGASTRODUODENOSCOPY (EGD) WITH PROPOFOL ;  Surgeon: Luke Salaam, MD;  Location: [facility] ENDOSCOPY;  Service: Gastroenterology;  Laterality: N/A;   ESOPHAGOGASTRODUODENOSCOPY (EGD) WITH PROPOFOL  N/A 03/24/2023   Procedure: ESOPHAGOGASTRODUODENOSCOPY (EGD) WITH PROPOFOL ;  Surgeon: Luke Salaam, MD;  Location: [facility] ENDOSCOPY;  Service: Gastroenterology;  Laterality: N/A;   LUNG SURGERY     Patient Active Problem List   Diagnosis Date Noted   Blood on toilet paper 03/24/2023   Adenomatous polyp of colon 03/24/2023   Gastric ulcer 03/24/2023   GI bleed 08/17/2022   Upper GI bleed 08/16/2022   Leukocytosis 08/16/2022   Chronic left-sided low back pain with left-sided sciatica 09/21/2021   Pure hypercholesterolemia 07/13/2020    Major depressive disorder, recurrent, in partial remission (HCC) 07/13/2020   GAD (generalized anxiety disorder) 07/13/2020   Encounter for screening mammogram for malignant neoplasm of breast 07/13/2020   Muscle spasm of left shoulder 07/13/2020   Chronic left shoulder pain 07/13/2020   Centrilobular emphysema (HCC) 07/13/2020   Moderate aortic valve insufficiency 07/13/2020   Obesity (BMI 30.0-34.9) 07/13/2020   DOE (dyspnea on exertion) 01/10/2020   Non-small cell carcinoma of lung (HCC) 01/02/2019   Cancer of upper lobe of right lung (HCC) 11/15/2018   Lung mass 10/02/2018   Essential hypertension 09/02/2014    PCP: Raina Bunting, DO  REFERRING PROVIDER: Ludwig Safer, PA-C  REFERRING DIAG:  [phone] (ICD-10-CM) - Lumbar radiculopathy    RATIONALE FOR EVALUATION AND TREATMENT: Rehabilitation  THERAPY DIAG: Other low back pain  Muscle weakness (generalized)  Other abnormalities of gait and mobility  ONSET DATE: Chronic (2019)  FOLLOW-UP APPT SCHEDULED WITH REFERRING PROVIDER: Yes    SUBJECTIVE:  SUBJECTIVE STATEMENT:  Chronic Low Back Pain   PERTINENT HISTORY:   Patient reports that she has had lower back pain since 2019. She reports that she has seen her provider and received multiple steroid injections with little relief.  Patient reports that pain starts in the lower back and refers down the posterior side of the L leg and ends the toes. Numbness and tingling are present in the posterior thigh and plantar aspect of the left foot. No relief from recent prescription (oxycodone , tremedol, hydrocodone ). Patient states that the pain intensifies with prolonged walking, sitting or standing. She reports that she spends most of the day on the floor or lying down due to the radiating  pain. She reports that pain in the L leg that disrupts her sleep throughout the night. Denies Saddle Anaesthesia, loss b/b, fever/chills.   Imaging Per Chart Review (02/26/2024):   EXAM: LUMBAR SPINE - COMPLETE 4+ VIEW   COMPARISON:  MRI lumbar spine January 18, 2024   FINDINGS: Minimal anterior osteophytic changes involving the L3-4 disc space. There is preservation of the height of the lumbar vertebral bodies and intervertebral disc spaces   No spondylolysis or listhesis   IMPRESSION: Minimal degenerative changes  PAIN:    Pain Intensity: Present: 9/10, Best: 5/10, Worst: 10/10 Pain location: lower back, Left posterior thigh and feet  Pain Quality: sharp and stabbing  Radiating: Yes  Numbness/Tingling: Yes Focal Weakness: No Aggravating factors: Prolonged walking, standing, sitting  Relieving factors: Rest, lying on back,  How long can you sit: < 10 min  How long can you stand: < 10 min  History of prior back injury, pain, surgery, or therapy: Yes Dominant hand: right  PRECAUTIONS: Fall  WEIGHT BEARING RESTRICTIONS: No  FALLS: Has patient fallen in last 6 months? No  Living Environment Lives with: Alone  Lives in: House/apartment Stairs: No Has following equipment at home: None  Prior level of function: Independent  Occupational demands: Retired   Presenter, broadcasting: Engineer, water, Cooking   Patient Goals: Relieve Pain and return PLOF    OBJECTIVE:  Patient Surveys    Cognition Patient is oriented to person, place, and time.  Attention span and concentration are intact.  Expressive speech is intact.     Gross Musculoskeletal Assessment Tremor: None Bulk: Normal Tone: Normal No visible step-off along spinal column, no signs of scoliosis  GAIT: Distance walked: 67m Assistive device utilized: None Level of assistance: Complete Independence Comments: Antalgic, Narrow Step Width, bilateral knee valgus present  Posture: Lumbar lordosis: WNL Iliac crest  height: Equal bilaterally Lumbar lateral shift: Negative  AROM AROM (Normal range in degrees) AROM   Lumbar   Flexion (65) 85% limited due to hamstring tightness  Extension (30) 100%  Right lateral flexion (25) 100%  Left lateral flexion (25) 100%  Right rotation (30) 100%  Left rotation (30) 100%      Hip Right Left  Flexion (125) WNL WNL  Extension (15)    Abduction (40)    Adduction     Internal Rotation (45) 30 30  External Rotation (45) 30 20*      Knee    Flexion (135) WNL WNL  Extension (0) WNL WNL      Ankle    Dorsiflexion (20)    Plantarflexion (50)    Inversion (35)    Eversion (15)    (* = pain; Blank rows = not tested)  LE MMT: MMT (out of 5) Right  Left   Hip flexion 3+ 3+  Hip extension    Hip abduction 4 3+  Hip adduction    Hip internal rotation    Hip external rotation    Knee flexion 4+ 4-  Knee extension 4+ 4-  Ankle dorsiflexion    Ankle plantarflexion    Ankle inversion    Ankle eversion    (* = pain; Blank rows = not tested)  Sensation Grossly intact to light touch throughout bilateral LEs as determined by testing dermatomes.   Intermittent sharp pain noted in R LE  in L5-S1 dermatomal pattern.  Reflexes Not Test  Muscle Length Hamstrings: R: Negative 20 L: Positive: 40 with reproduction of pain  Palpation Location Right Left         Lumbar paraspinals 0 0  Quadratus Lumborum 0 0  Gluteus Maximus 1 1  Gluteus Medius 1 1  Deep hip external rotators 1 2  PSIS    Fortin's Area (SIJ)    Greater Trochanter 1 2  (Blank rows = not tested) Graded on 0-4 scale (0 = no pain, 1 = pain, 2 = pain with wincing/grimacing/flinching, 3 = pain with withdrawal, 4 = unwilling to allow palpation)  Passive Accessory Intervertebral Motion Pt reports reproduction of back pain with CPA L4-S1 segments with no improvements following prolonged CPA.   Special Tests Lumbar Radiculopathy and Discogenic: Centralization and Peripheralization  (SN 92, -LR 0.12): Not Tested  Slump (SN 83, -LR 0.32): Not Tested SLR (SN 92, -LR 0.29): Not Tested  Hip: FABER (SN 81): R: Negative L: Negative FADIR (SN 94): R: Negative L: Negative  Functional Tasks  Deep squat: Narrow BOS, Hip hinge absent, decreased ankle dorsiflexion   Sit to stand: Use of BUE support, minor knee valgus present  Forward Step-Down Test: R: Pain with Knee Valgus present   TODAY'S TREATMENT: DATE: 04/02/2024   Subjective: Pt reports 0/10 pain in lumbar spine. Able to sit longer periods without pain. Currently her hips feel "stiff" when walking. No questions or concerns.   There.ex:   Nustep x level 1-3 x 5 min for warm up in the lower legs, increasingLE strength and muscular endurance. PT manually adjusted resistance.   Supine Bridge with TrA activation  2 x 10, added manual resistance at ASIS with last 5 reps ea set  Dead bug for core stabilization   2 x 10 Alternating UE/LE   Side Plank on L elbow, knees bent   3 x 10s  Bird Dog  LE alternating 1 x 10   UE/LE alternating 1 x 10   Passive Single Knee to Chest Stretch  R/L: 30s/bout x 3 in order to improve ROM and tissue extensibility  Passive Piriformis Stretch  R/L: 30s/bout x 3 in order to improve ROM and tissue extensibility  Passive Hamstring stretch   R/L: 30s/bout x 3 in order to improve ROM and tissue extensibility  PT added passive dorsiflexion ea leg in order to improve sciatic extensibility   There.act:  STS from 19" mat table  2 x 10   2 x 10 with forward shoulder press (2Kg med ball)   Supine Bridges with Ball Squeeze in order to improve hip stability  1 x 10, green ball    Deep Squat with Kettlebell   1 x 10 10# KB  1 x 10 20# KB   Wall sit with anti-rotation (resistance from L)   3 x 10s, Red TB  PATIENT EDUCATION:  Education details: HEP, Exercises  Person educated: Patient Education method: Explanation, Actor cues, Verbal  cues, and Handouts Education  comprehension: verbalized understanding, returned demonstration, and needs further education   HOME EXERCISE PROGRAM:  Access Code: [email or web address] URL: [email or web address] Date: 03/12/2024 Prepared by: Veryl Gottron Tenna Lacko  Exercises - Supine Figure 4 Piriformis Stretch  - 1 x daily - 7 x weekly - 3 sets - 30-60 hold - Supine Single Knee to Chest Stretch  - 1 x daily - 7 x weekly - 3 sets - 30-60 hold - Seated Hamstring Stretch  - 1 x daily - 7 x weekly - 3 sets - 30-60 hold - Bridge with Hip Abduction and Resistance  - 1 x daily - 3-4 x weekly - 2-3 sets - 10-12 reps - Clam with Resistance  - 1 x daily - 3-4 x weekly - 2-3 sets - 10-12 reps   ASSESSMENT:  CLINICAL IMPRESSION: Pt arrived to OPPT with continued focus on improving core stabilization. Pt highly motivated and with good participation throughout today's session. She continues to tolerate progressions in core exercises with increased intensity without exacerbation of pain in lumbar spine. PT corrected form for bird dog in order to optimize technique and core stabilization. No updates to HEP today. Based on today's session patient will continue to benefit from skilled physical therapy to increase core stabilization and functional strength in order to improve overall QoL and maximize return to PLOF.   OBJECTIVE IMPAIRMENTS: Abnormal gait, decreased activity tolerance, difficulty walking, decreased ROM, decreased strength, and pain.   ACTIVITY LIMITATIONS: carrying, bending, sitting, standing, squatting, and sleeping  PARTICIPATION LIMITATIONS: driving, shopping, and community activity  PERSONAL FACTORS: Age, Social background, and Time since onset of injury/illness/exacerbation are also affecting patient's functional outcome.   REHAB POTENTIAL: Good  CLINICAL DECISION MAKING: Evolving/moderate complexity  EVALUATION COMPLEXITY: Moderate   GOALS: Goals reviewed with patient? No  SHORT TERM GOALS: Target date:  05/14/2024  Pt will be independent with HEP in order to improve strength and decrease back pain to improve pain-free function at home and work. Baseline: n/a Goal status: INITIAL   LONG TERM GOALS: Target date: 06/25/2024  Pt will be able to sit for 30-60 minutes without reporting discomfort in order to demonstrate significant improvements in household ADLs and return to PLOF.  Baseline: 04/02/2024: Pt reports sitting on the couch > 45 min Goal status: Progressing  2.  Pt will decrease worst back pain by at least 2 points on the NPRS in order to demonstrate clinically significant reduction in back pain. Baseline: 03/12/2024: 10/10 NPS Goal status: INITIAL  3.  Pt will decrease mODI score by at least 13 points in order demonstrate clinically significant reduction in back pain/disability.     Baseline: 03/18/2024: 31 / 50 = 62.0 % Goal status: INITIAL  4.  Pt will increase L Hip MMT by 1 grade in order to demonstrate clinically significant improvements in LE strength.  Baseline: 03/12/2024 L Hip Flex: 3+/5, Abd: 3+/5 Goal status: INITIAL  5.  Pt will improve 5xSTS to 12 seconds or less to demonstrate clinically significant improvement in LE strength and ability to transfer pain free Baseline: 03/18/2024: 13.47s (7.72.6 sec) Goal status: INITIAL    PLAN: PT FREQUENCY: 1-2x/week  PT DURATION: 12 weeks  PLANNED INTERVENTIONS: Therapeutic exercises, Therapeutic activity, Neuromuscular re-education, Balance training, Gait training, Patient/Family education, Self Care, Joint mobilization, Joint manipulationOrthotic/Fit training, DME instructions, Dry Needling, Electrical stimulation, Spinal manipulation, Spinal mobilization, Cryotherapy, Moist heat, Taping, Traction, Ultrasound, Manual therapy, and Re-evaluation.  PLAN FOR NEXT SESSION: Progress Hip Strength and core stabilization, manual techniques  PRN    Satira Curet PT, DPT 04/02/2024, 10:07 PM

## 2024-04-05 ENCOUNTER — Other Ambulatory Visit: Payer: Self-pay | Admitting: Family Medicine

## 2024-04-05 DIAGNOSIS — G8929 Other chronic pain: Secondary | ICD-10-CM

## 2024-04-05 DIAGNOSIS — M5416 Radiculopathy, lumbar region: Secondary | ICD-10-CM

## 2024-04-05 MED ORDER — OXYCODONE-ACETAMINOPHEN 10-325 MG PO TABS: 1.0000 | ORAL_TABLET | ORAL | 0 refills | Status: DC | PRN

## 2024-04-08 ENCOUNTER — Ambulatory Visit: Attending: Physician Assistant

## 2024-04-08 DIAGNOSIS — M5459 Other low back pain: Secondary | ICD-10-CM | POA: Insufficient documentation

## 2024-04-08 DIAGNOSIS — R2689 Other abnormalities of gait and mobility: Secondary | ICD-10-CM | POA: Diagnosis not present

## 2024-04-08 DIAGNOSIS — M6281 Muscle weakness (generalized): Secondary | ICD-10-CM | POA: Diagnosis not present

## 2024-04-08 NOTE — Therapy (Signed)
 OUTPATIENT PHYSICAL THERAPY THORACOLUMBAR TREATMENT   Patient Name: Cheryl Payne MRN: [id number] DOB:Feb 20, 1966, 58 y.o., female Today's Date: 04/08/2024  END OF SESSION:  PT End of Session - 04/08/24 0914     Visit Number 5    Number of Visits 25    Date for PT Re-Evaluation 06/04/24    Progress Note Due on Visit 10    PT Start Time 0905    PT Stop Time 0945    PT Time Calculation (min) 40 min    Activity Tolerance Patient tolerated treatment well    Behavior During Therapy [facility] for tasks assessed/performed               Past Medical History:  Diagnosis Date   Anxiety NA   Arthritis    Blood transfusion without reported diagnosis 2023   When i was in the hospital   Cancer [facility])    lung   Chronic left shoulder pain    Depression    GERD (gastroesophageal reflux disease)    Hypertension    Ulcer 2023   When i was in the hospital   Past Surgical History:  Procedure Laterality Date   COLONOSCOPY WITH PROPOFOL  N/A 03/24/2023   Procedure: COLONOSCOPY WITH PROPOFOL ;  Surgeon: Luke Salaam, MD;  Location: [facility] ENDOSCOPY;  Service: Gastroenterology;  Laterality: N/A;   ESOPHAGOGASTRODUODENOSCOPY (EGD) WITH PROPOFOL  N/A 08/17/2022   Procedure: ESOPHAGOGASTRODUODENOSCOPY (EGD) WITH PROPOFOL ;  Surgeon: Luke Salaam, MD;  Location: [facility] ENDOSCOPY;  Service: Gastroenterology;  Laterality: N/A;   ESOPHAGOGASTRODUODENOSCOPY (EGD) WITH PROPOFOL  N/A 03/24/2023   Procedure: ESOPHAGOGASTRODUODENOSCOPY (EGD) WITH PROPOFOL ;  Surgeon: Luke Salaam, MD;  Location: [facility] ENDOSCOPY;  Service: Gastroenterology;  Laterality: N/A;   LUNG SURGERY     Patient Active Problem List   Diagnosis Date Noted   Blood on toilet paper 03/24/2023   Adenomatous polyp of colon 03/24/2023   Gastric ulcer 03/24/2023   GI bleed 08/17/2022   Upper GI bleed 08/16/2022   Leukocytosis 08/16/2022   Chronic left-sided low back pain with left-sided sciatica 09/21/2021   Pure hypercholesterolemia 07/13/2020    Major depressive disorder, recurrent, in partial remission (HCC) 07/13/2020   GAD (generalized anxiety disorder) 07/13/2020   Encounter for screening mammogram for malignant neoplasm of breast 07/13/2020   Muscle spasm of left shoulder 07/13/2020   Chronic left shoulder pain 07/13/2020   Centrilobular emphysema (HCC) 07/13/2020   Moderate aortic valve insufficiency 07/13/2020   Obesity (BMI 30.0-34.9) 07/13/2020   DOE (dyspnea on exertion) 01/10/2020   Non-small cell carcinoma of lung (HCC) 01/02/2019   Cancer of upper lobe of right lung (HCC) 11/15/2018   Lung mass 10/02/2018   Essential hypertension 09/02/2014    PCP: Raina Bunting, DO  REFERRING PROVIDER: Ludwig Safer, PA-C  REFERRING DIAG:  [phone] (ICD-10-CM) - Lumbar radiculopathy    RATIONALE FOR EVALUATION AND TREATMENT: Rehabilitation  THERAPY DIAG: Other low back pain  Muscle weakness (generalized)  Other abnormalities of gait and mobility  ONSET DATE: Chronic (2019)  FOLLOW-UP APPT SCHEDULED WITH REFERRING PROVIDER: Yes    SUBJECTIVE:  SUBJECTIVE STATEMENT:  Chronic Low Back Pain   PERTINENT HISTORY:   Patient reports that she has had lower back pain since 2019. She reports that she has seen her provider and received multiple steroid injections with little relief.  Patient reports that pain starts in the lower back and refers down the posterior side of the L leg and ends the toes. Numbness and tingling are present in the posterior thigh and plantar aspect of the left foot. No relief from recent prescription (oxycodone , tremedol, hydrocodone ). Patient states that the pain intensifies with prolonged walking, sitting or standing. She reports that she spends most of the day on the floor or lying down due to the radiating  pain. She reports that pain in the L leg that disrupts her sleep throughout the night. Denies Saddle Anaesthesia, loss b/b, fever/chills.   Imaging Per Chart Review (02/26/2024):   EXAM: LUMBAR SPINE - COMPLETE 4+ VIEW   COMPARISON:  MRI lumbar spine January 18, 2024   FINDINGS: Minimal anterior osteophytic changes involving the L3-4 disc space. There is preservation of the height of the lumbar vertebral bodies and intervertebral disc spaces   No spondylolysis or listhesis   IMPRESSION: Minimal degenerative changes  PAIN:    Pain Intensity: Present: 9/10, Best: 5/10, Worst: 10/10 Pain location: lower back, Left posterior thigh and feet  Pain Quality: sharp and stabbing  Radiating: Yes  Numbness/Tingling: Yes Focal Weakness: No Aggravating factors: Prolonged walking, standing, sitting  Relieving factors: Rest, lying on back,  How long can you sit: < 10 min  How long can you stand: < 10 min  History of prior back injury, pain, surgery, or therapy: Yes Dominant hand: right  PRECAUTIONS: Fall  WEIGHT BEARING RESTRICTIONS: No  FALLS: Has patient fallen in last 6 months? No  Living Environment Lives with: Alone  Lives in: House/apartment Stairs: No Has following equipment at home: None  Prior level of function: Independent  Occupational demands: Retired   Presenter, broadcasting: Engineer, water, Cooking   Patient Goals: Relieve Pain and return PLOF    OBJECTIVE:  Patient Surveys    Cognition Patient is oriented to person, place, and time.  Attention span and concentration are intact.  Expressive speech is intact.     Gross Musculoskeletal Assessment Tremor: None Bulk: Normal Tone: Normal No visible step-off along spinal column, no signs of scoliosis  GAIT: Distance walked: 96m Assistive device utilized: None Level of assistance: Complete Independence Comments: Antalgic, Narrow Step Width, bilateral knee valgus present  Posture: Lumbar lordosis: WNL Iliac crest  height: Equal bilaterally Lumbar lateral shift: Negative  AROM AROM (Normal range in degrees) AROM   Lumbar   Flexion (65) 85% limited due to hamstring tightness  Extension (30) 100%  Right lateral flexion (25) 100%  Left lateral flexion (25) 100%  Right rotation (30) 100%  Left rotation (30) 100%      Hip Right Left  Flexion (125) WNL WNL  Extension (15)    Abduction (40)    Adduction     Internal Rotation (45) 30 30  External Rotation (45) 30 20*      Knee    Flexion (135) WNL WNL  Extension (0) WNL WNL      Ankle    Dorsiflexion (20)    Plantarflexion (50)    Inversion (35)    Eversion (15)    (* = pain; Blank rows = not tested)  LE MMT: MMT (out of 5) Right  Left   Hip flexion 3+ 3+  Hip extension    Hip abduction 4 3+  Hip adduction    Hip internal rotation    Hip external rotation    Knee flexion 4+ 4-  Knee extension 4+ 4-  Ankle dorsiflexion    Ankle plantarflexion    Ankle inversion    Ankle eversion    (* = pain; Blank rows = not tested)  Sensation Grossly intact to light touch throughout bilateral LEs as determined by testing dermatomes.   Intermittent sharp pain noted in R LE  in L5-S1 dermatomal pattern.  Reflexes Not Test  Muscle Length Hamstrings: R: Negative 20 L: Positive: 40 with reproduction of pain  Palpation Location Right Left         Lumbar paraspinals 0 0  Quadratus Lumborum 0 0  Gluteus Maximus 1 1  Gluteus Medius 1 1  Deep hip external rotators 1 2  PSIS    Fortin's Area (SIJ)    Greater Trochanter 1 2  (Blank rows = not tested) Graded on 0-4 scale (0 = no pain, 1 = pain, 2 = pain with wincing/grimacing/flinching, 3 = pain with withdrawal, 4 = unwilling to allow palpation)  Passive Accessory Intervertebral Motion Pt reports reproduction of back pain with CPA L4-S1 segments with no improvements following prolonged CPA.   Special Tests Lumbar Radiculopathy and Discogenic: Centralization and Peripheralization  (SN 92, -LR 0.12): Not Tested  Slump (SN 83, -LR 0.32): Not Tested SLR (SN 92, -LR 0.29): Not Tested  Hip: FABER (SN 81): R: Negative L: Negative FADIR (SN 94): R: Negative L: Negative  Functional Tasks  Deep squat: Narrow BOS, Hip hinge absent, decreased ankle dorsiflexion   Sit to stand: Use of BUE support, minor knee valgus present  Forward Step-Down Test: R: Pain with Knee Valgus present   TODAY'S TREATMENT: DATE: 04/08/2024   Subjective: Pt reports 0/10 pain in lumbar spine. Patient reports that she is still able to sit and stand for more than hour. Patient will have family reunion this weeks No questions or concerns.   There.ex:   Nustep x level 5-1 x 6 min for warm up in the lower legs, increasing LE strength and muscular endurance. PT manually adjusted resistance.   Supine Bridge with TrA activation  1 x 8   1 x 10, with anti rotation, Blue TB   1 x 10, with anti rotation, Blue TB  Dead bug for core stabilization   1 x 10 Alternating UE/LE   1 x 10 with Anti Rotation, Blue TB   1 x 10 with Anti Rotation, Blue TB    Side Plank on L elbow, knees bent   1 x 10s  1 x 12s  1 x 15s   Bird Dog  LE alternating 2 x 10   There.act:  STS from 19" mat table  3 x 10 with 3Kg [employer] Press)    Farmer's Carry 4 x 16' 20# KB  2 x 16' 30# KB 2 x 16' 30# KB    Standing on Reverse Bosu with anti rotation:   1 x 10 Green TB   1 x 10 Blue TB   PATIENT EDUCATION:  Education details: HEP, Exercises  Person educated: Patient Education method: Explanation, Actor cues, Verbal cues, and Handouts Education comprehension: verbalized understanding, returned demonstration, and needs further education   HOME EXERCISE PROGRAM:  Access Code: [email or web address] URL: [email or web address] Date: 04/08/2024 Prepared by: Satira Curet  Exercises - Supine Figure 4 Piriformis Stretch  - 1 x  daily - 7 x weekly - 3 sets - 30-60 hold - Supine Single Knee to  Chest Stretch  - 1 x daily - 7 x weekly - 3 sets - 30-60 hold - Seated Hamstring Stretch  - 1 x daily - 7 x weekly - 3 sets - 30-60 hold - Bridge with Hip Abduction and Resistance  - 1 x daily - 3-4 x weekly - 2-3 sets - 10-12 reps - Clam with Resistance  - 1 x daily - 3-4 x weekly - 2-3 sets - 10-12 reps - Bird Dog  - 1 x daily - 3-4 x weekly - 2-3 sets - 10-12 reps - Supine Dead Bug with Leg Extension  - 1 x daily - 3-4 x weekly - 2-3 sets - 10-12 reps - Side Plank on Knees  - 1 x daily - 3-4 x weekly - 2 sets - 3 reps - 10s hold  Access Code: [email or web address] URL: [email or web address] Date: 03/12/2024 Prepared by: Veryl Gottron Lorance Pickeral  Exercises - Supine Figure 4 Piriformis Stretch  - 1 x daily - 7 x weekly - 3 sets - 30-60 hold - Supine Single Knee to Chest Stretch  - 1 x daily - 7 x weekly - 3 sets - 30-60 hold - Seated Hamstring Stretch  - 1 x daily - 7 x weekly - 3 sets - 30-60 hold - Bridge with Hip Abduction and Resistance  - 1 x daily - 3-4 x weekly - 2-3 sets - 10-12 reps - Clam with Resistance  - 1 x daily - 3-4 x weekly - 2-3 sets - 10-12 reps   ASSESSMENT:  CLINICAL IMPRESSION: Pt arrived to OPPT with continued focus on improving core stabilization. Pt highly motivated and with good participation throughout today's session. Pt tolerated all progression to core stabilization exercises. PT added additional resistance and use of unstable surface in order to target multiple joints with core stabilization. No exacerbation of pain in lumbar spine while walking with heavier loads.  No updates to HEP today. Based on today's session patient will continue to benefit from skilled physical therapy to increase core stabilization and functional strength in order to improve overall QoL and maximize return to PLOF.   OBJECTIVE IMPAIRMENTS: Abnormal gait, decreased activity tolerance, difficulty walking, decreased ROM, decreased strength, and pain.   ACTIVITY LIMITATIONS: carrying, bending,  sitting, standing, squatting, and sleeping  PARTICIPATION LIMITATIONS: driving, shopping, and community activity  PERSONAL FACTORS: Age, Social background, and Time since onset of injury/illness/exacerbation are also affecting patient's functional outcome.   REHAB POTENTIAL: Good  CLINICAL DECISION MAKING: Evolving/moderate complexity  EVALUATION COMPLEXITY: Moderate   GOALS: Goals reviewed with patient? No  SHORT TERM GOALS: Target date: 05/20/2024  Pt will be independent with HEP in order to improve strength and decrease back pain to improve pain-free function at home and work. Baseline: n/a Goal status: INITIAL   LONG TERM GOALS: Target date: 07/01/2024  Pt will be able to sit for 30-60 minutes without reporting discomfort in order to demonstrate significant improvements in household ADLs and return to PLOF.  Baseline: 04/02/2024: Pt reports sitting on the couch > 45 min Goal status: Progressing  2.  Pt will decrease worst back pain by at least 2 points on the NPRS in order to demonstrate clinically significant reduction in back pain. Baseline: 03/12/2024: 10/10 NPS Goal status: INITIAL  3.  Pt will decrease mODI score by at least 13 points in order demonstrate clinically significant reduction in back pain/disability.     Baseline:  03/18/2024: 31 / 50 = 62.0 % Goal status: INITIAL  4.  Pt will increase L Hip MMT by 1 grade in order to demonstrate clinically significant improvements in LE strength.  Baseline: 03/12/2024 L Hip Flex: 3+/5, Abd: 3+/5 Goal status: INITIAL  5.  Pt will improve 5xSTS to 12 seconds or less to demonstrate clinically significant improvement in LE strength and ability to transfer pain free Baseline: 03/18/2024: 13.47s (7.72.6 sec) Goal status: INITIAL    PLAN: PT FREQUENCY: 1-2x/week  PT DURATION: 12 weeks  PLANNED INTERVENTIONS: Therapeutic exercises, Therapeutic activity, Neuromuscular re-education, Balance training, Gait training,  Patient/Family education, Self Care, Joint mobilization, Joint manipulationOrthotic/Fit training, DME instructions, Dry Needling, Electrical stimulation, Spinal manipulation, Spinal mobilization, Cryotherapy, Moist heat, Taping, Traction, Ultrasound, Manual therapy, and Re-evaluation.  PLAN FOR NEXT SESSION: Progress Hip Strength and core stabilization, manual techniques PRN    Satira Curet PT, DPT 04/08/2024, 10:40 AM

## 2024-04-10 ENCOUNTER — Ambulatory Visit

## 2024-04-10 DIAGNOSIS — M5459 Other low back pain: Secondary | ICD-10-CM

## 2024-04-10 DIAGNOSIS — R2689 Other abnormalities of gait and mobility: Secondary | ICD-10-CM

## 2024-04-10 DIAGNOSIS — M6281 Muscle weakness (generalized): Secondary | ICD-10-CM | POA: Diagnosis not present

## 2024-04-10 NOTE — Therapy (Signed)
 OUTPATIENT PHYSICAL THERAPY THORACOLUMBAR TREATMENT   Patient Name: Cheryl Payne MRN: [id number] DOB:Jan 10, 1966, 58 y.o., female Today's Date: 04/10/2024  END OF SESSION:  PT End of Session - 04/10/24 1032     Visit Number 6    Number of Visits 25    Date for PT Re-Evaluation 06/04/24    Progress Note Due on Visit 10    PT Start Time 1032    PT Stop Time 1111    PT Time Calculation (min) 39 min    Activity Tolerance Patient tolerated treatment well    Behavior During Therapy WFL for tasks assessed/performed               Past Medical History:  Diagnosis Date   Anxiety NA   Arthritis    Blood transfusion without reported diagnosis 2023   When i was in the hospital   Cancer [facility])    lung   Chronic left shoulder pain    Depression    GERD (gastroesophageal reflux disease)    Hypertension    Ulcer 2023   When i was in the hospital   Past Surgical History:  Procedure Laterality Date   COLONOSCOPY WITH PROPOFOL  N/A 03/24/2023   Procedure: COLONOSCOPY WITH PROPOFOL ;  Surgeon: Luke Salaam, MD;  Location: [facility] ENDOSCOPY;  Service: Gastroenterology;  Laterality: N/A;   ESOPHAGOGASTRODUODENOSCOPY (EGD) WITH PROPOFOL  N/A 08/17/2022   Procedure: ESOPHAGOGASTRODUODENOSCOPY (EGD) WITH PROPOFOL ;  Surgeon: Luke Salaam, MD;  Location: [facility] ENDOSCOPY;  Service: Gastroenterology;  Laterality: N/A;   ESOPHAGOGASTRODUODENOSCOPY (EGD) WITH PROPOFOL  N/A 03/24/2023   Procedure: ESOPHAGOGASTRODUODENOSCOPY (EGD) WITH PROPOFOL ;  Surgeon: Luke Salaam, MD;  Location: [facility] ENDOSCOPY;  Service: Gastroenterology;  Laterality: N/A;   LUNG SURGERY     Patient Active Problem List   Diagnosis Date Noted   Blood on toilet paper 03/24/2023   Adenomatous polyp of colon 03/24/2023   Gastric ulcer 03/24/2023   GI bleed 08/17/2022   Upper GI bleed 08/16/2022   Leukocytosis 08/16/2022   Chronic left-sided low back pain with left-sided sciatica 09/21/2021   Pure hypercholesterolemia 07/13/2020    Major depressive disorder, recurrent, in partial remission (HCC) 07/13/2020   GAD (generalized anxiety disorder) 07/13/2020   Encounter for screening mammogram for malignant neoplasm of breast 07/13/2020   Muscle spasm of left shoulder 07/13/2020   Chronic left shoulder pain 07/13/2020   Centrilobular emphysema (HCC) 07/13/2020   Moderate aortic valve insufficiency 07/13/2020   Obesity (BMI 30.0-34.9) 07/13/2020   DOE (dyspnea on exertion) 01/10/2020   Non-small cell carcinoma of lung (HCC) 01/02/2019   Cancer of upper lobe of right lung (HCC) 11/15/2018   Lung mass 10/02/2018   Essential hypertension 09/02/2014    PCP: Raina Bunting, DO  REFERRING PROVIDER: Ludwig Safer, PA-C  REFERRING DIAG:  [phone] (ICD-10-CM) - Lumbar radiculopathy    RATIONALE FOR EVALUATION AND TREATMENT: Rehabilitation  THERAPY DIAG: Other low back pain  Muscle weakness (generalized)  Other abnormalities of gait and mobility  ONSET DATE: Chronic (2019)  FOLLOW-UP APPT SCHEDULED WITH REFERRING PROVIDER: Yes    SUBJECTIVE:  SUBJECTIVE STATEMENT:  Chronic Low Back Pain   PERTINENT HISTORY:   Patient reports that she has had lower back pain since 2019. She reports that she has seen her provider and received multiple steroid injections with little relief.  Patient reports that pain starts in the lower back and refers down the posterior side of the L leg and ends the toes. Numbness and tingling are present in the posterior thigh and plantar aspect of the left foot. No relief from recent prescription (oxycodone , tremedol, hydrocodone ). Patient states that the pain intensifies with prolonged walking, sitting or standing. She reports that she spends most of the day on the floor or lying down due to the radiating  pain. She reports that pain in the L leg that disrupts her sleep throughout the night. Denies Saddle Anaesthesia, loss b/b, fever/chills.   Imaging Per Chart Review (02/26/2024):   EXAM: LUMBAR SPINE - COMPLETE 4+ VIEW   COMPARISON:  MRI lumbar spine January 18, 2024   FINDINGS: Minimal anterior osteophytic changes involving the L3-4 disc space. There is preservation of the height of the lumbar vertebral bodies and intervertebral disc spaces   No spondylolysis or listhesis   IMPRESSION: Minimal degenerative changes  PAIN:    Pain Intensity: Present: 9/10, Best: 5/10, Worst: 10/10 Pain location: lower back, Left posterior thigh and feet  Pain Quality: sharp and stabbing  Radiating: Yes  Numbness/Tingling: Yes Focal Weakness: No Aggravating factors: Prolonged walking, standing, sitting  Relieving factors: Rest, lying on back,  How long can you sit: < 10 min  How long can you stand: < 10 min  History of prior back injury, pain, surgery, or therapy: Yes Dominant hand: right  PRECAUTIONS: Fall  WEIGHT BEARING RESTRICTIONS: No  FALLS: Has patient fallen in last 6 months? No  Living Environment Lives with: Alone  Lives in: House/apartment Stairs: No Has following equipment at home: None  Prior level of function: Independent  Occupational demands: Retired   Presenter, broadcasting: Engineer, water, Cooking   Patient Goals: Relieve Pain and return PLOF    OBJECTIVE:  Patient Surveys    Cognition Patient is oriented to person, place, and time.  Attention span and concentration are intact.  Expressive speech is intact.     Gross Musculoskeletal Assessment Tremor: None Bulk: Normal Tone: Normal No visible step-off along spinal column, no signs of scoliosis  GAIT: Distance walked: 33m Assistive device utilized: None Level of assistance: Complete Independence Comments: Antalgic, Narrow Step Width, bilateral knee valgus present  Posture: Lumbar lordosis: WNL Iliac crest  height: Equal bilaterally Lumbar lateral shift: Negative  AROM AROM (Normal range in degrees) AROM   Lumbar   Flexion (65) 85% limited due to hamstring tightness  Extension (30) 100%  Right lateral flexion (25) 100%  Left lateral flexion (25) 100%  Right rotation (30) 100%  Left rotation (30) 100%      Hip Right Left  Flexion (125) WNL WNL  Extension (15)    Abduction (40)    Adduction     Internal Rotation (45) 30 30  External Rotation (45) 30 20*      Knee    Flexion (135) WNL WNL  Extension (0) WNL WNL      Ankle    Dorsiflexion (20)    Plantarflexion (50)    Inversion (35)    Eversion (15)    (* = pain; Blank rows = not tested)  LE MMT: MMT (out of 5) Right  Left   Hip flexion 3+ 3+  Hip extension    Hip abduction 4 3+  Hip adduction    Hip internal rotation    Hip external rotation    Knee flexion 4+ 4-  Knee extension 4+ 4-  Ankle dorsiflexion    Ankle plantarflexion    Ankle inversion    Ankle eversion    (* = pain; Blank rows = not tested)  Sensation Grossly intact to light touch throughout bilateral LEs as determined by testing dermatomes.   Intermittent sharp pain noted in R LE  in L5-S1 dermatomal pattern.  Reflexes Not Test  Muscle Length Hamstrings: R: Negative 20 L: Positive: 40 with reproduction of pain  Palpation Location Right Left         Lumbar paraspinals 0 0  Quadratus Lumborum 0 0  Gluteus Maximus 1 1  Gluteus Medius 1 1  Deep hip external rotators 1 2  PSIS    Fortin's Area (SIJ)    Greater Trochanter 1 2  (Blank rows = not tested) Graded on 0-4 scale (0 = no pain, 1 = pain, 2 = pain with wincing/grimacing/flinching, 3 = pain with withdrawal, 4 = unwilling to allow palpation)  Passive Accessory Intervertebral Motion Pt reports reproduction of back pain with CPA L4-S1 segments with no improvements following prolonged CPA.   Special Tests Lumbar Radiculopathy and Discogenic: Centralization and Peripheralization  (SN 92, -LR 0.12): Not Tested  Slump (SN 83, -LR 0.32): Not Tested SLR (SN 92, -LR 0.29): Not Tested  Hip: FABER (SN 81): R: Negative L: Negative FADIR (SN 94): R: Negative L: Negative  Functional Tasks  Deep squat: Narrow BOS, Hip hinge absent, decreased ankle dorsiflexion   Sit to stand: Use of BUE support, minor knee valgus present  Forward Step-Down Test: R: Pain with Knee Valgus present   TODAY'S TREATMENT: DATE: 04/10/2024   Subjective: Pt reports 0/10 pain in lumbar spine. 3-4/10 in the L posterior thigh.  Patient reports that she is still able to sit and stand for more than hour. Upcoming family reunion in a couple days. No questions or concerns.   There.ex:   Passive Hamstring Stretch;   30s/bout x 2 in order to improve ROM and tissue extensibility  Added DF for 10 reps at 80 hip flexion   Supine Piriformis Stretch;   30s/bout x 3 in order to improve ROM and tissue extensibility  Supine Extended Bridge on Heels for hamstring and core stabilization;  2 x 10   1 x 10 with anti-rotation against Blue TB   Dead bug with Swiss ball;  1 x 10 Alternating UE/LE   Isometric Dead Bug with Swiss Ball for core stability, LE strength   4 x 10s   Standard Plank on Elbow for core stabilization, and endurance, shoulder stability  3 x 10s   There.act:  Lateral Stepping with Blue TB;  1 x 16' Blue around thighs  1 x 16' Blue around ankles  STS from 19" mat table for LE strength, core stability and shoulder strengthening   3 x 10, 3 Kg Med Ball in [address] [employer]), Green TB around the [employer] at [employer]  2 x 10 7#, resistance from R/L    Time spent reviewing Seated Hamstring Stretch and Supine Piriformis stretch with return demonstration from patient. PT encouraged proper frequency. Good return demonstration following multimodal cues for proper joint positioning.   PATIENT EDUCATION:  Education details: HEP, Exercises  Person  educated: Patient Education method: Explanation, Tactile cues,  Verbal cues, and Handouts Education comprehension: verbalized understanding, returned demonstration, and needs further education   HOME EXERCISE PROGRAM:  Access Code: [email or web address] URL: [email or web address] Date: 04/08/2024 Prepared by: Veryl Gottron Jorey Dollard  Exercises - Supine Figure 4 Piriformis Stretch  - 1 x daily - 7 x weekly - 3 sets - 30-60 hold - Supine Single Knee to Chest Stretch  - 1 x daily - 7 x weekly - 3 sets - 30-60 hold - Seated Hamstring Stretch  - 1 x daily - 7 x weekly - 3 sets - 30-60 hold - Bridge with Hip Abduction and Resistance  - 1 x daily - 3-4 x weekly - 2-3 sets - 10-12 reps - Clam with Resistance  - 1 x daily - 3-4 x weekly - 2-3 sets - 10-12 reps - Bird Dog  - 1 x daily - 3-4 x weekly - 2-3 sets - 10-12 reps - Supine Dead Bug with Leg Extension  - 1 x daily - 3-4 x weekly - 2-3 sets - 10-12 reps - Side Plank on Knees  - 1 x daily - 3-4 x weekly - 2 sets - 3 reps - 10s hold  Access Code: [email or web address] URL: [email or web address] Date: 03/12/2024 Prepared by: Veryl Gottron Maurice Ramseur  Exercises - Supine Figure 4 Piriformis Stretch  - 1 x daily - 7 x weekly - 3 sets - 30-60 hold - Supine Single Knee to Chest Stretch  - 1 x daily - 7 x weekly - 3 sets - 30-60 hold - Seated Hamstring Stretch  - 1 x daily - 7 x weekly - 3 sets - 30-60 hold - Bridge with Hip Abduction and Resistance  - 1 x daily - 3-4 x weekly - 2-3 sets - 10-12 reps - Clam with Resistance  - 1 x daily - 3-4 x weekly - 2-3 sets - 10-12 reps   ASSESSMENT:  CLINICAL IMPRESSION: Pt arrived to OPPT with continued focus on improving core stabilization. Pt highly motivated and with good participation throughout today's session. Pt tolerated all progression to core stabilization exercises. Time spent performing passive stretching in order to address tension and pain reported in the L hamstring/thigh. PT encouraged adherence to HEP  throughout the weekend in order to reduce pain. No updates to HEP today. Based on today's session patient will continue to benefit from skilled physical therapy to increase core stabilization and functional strength in order to improve overall QoL and maximize return to PLOF.   OBJECTIVE IMPAIRMENTS: Abnormal gait, decreased activity tolerance, difficulty walking, decreased ROM, decreased strength, and pain.   ACTIVITY LIMITATIONS: carrying, bending, sitting, standing, squatting, and sleeping  PARTICIPATION LIMITATIONS: driving, shopping, and community activity  PERSONAL FACTORS: Age, Social background, and Time since onset of injury/illness/exacerbation are also affecting patient's functional outcome.   REHAB POTENTIAL: Good  CLINICAL DECISION MAKING: Evolving/moderate complexity  EVALUATION COMPLEXITY: Moderate   GOALS: Goals reviewed with patient? No  SHORT TERM GOALS: Target date: 05/22/2024  Pt will be independent with HEP in order to improve strength and decrease back pain to improve pain-free function at home and work. Baseline: n/a Goal status: INITIAL   LONG TERM GOALS: Target date: 07/03/2024  Pt will be able to sit for 30-60 minutes without reporting discomfort in order to demonstrate significant improvements in household ADLs and return to PLOF.  Baseline: 04/02/2024: Pt reports sitting on the couch > 45 min Goal status: Progressing  2.  Pt will decrease worst back pain by at least 2 points on the NPRS in order to  demonstrate clinically significant reduction in back pain. Baseline: 03/12/2024: 10/10 NPS Goal status: INITIAL  3.  Pt will decrease mODI score by at least 13 points in order demonstrate clinically significant reduction in back pain/disability.     Baseline: 03/18/2024: 31 / 50 = 62.0 % Goal status: INITIAL  4.  Pt will increase L Hip MMT by 1 grade in order to demonstrate clinically significant improvements in LE strength.  Baseline: 03/12/2024 L Hip  Flex: 3+/5, Abd: 3+/5 Goal status: INITIAL  5.  Pt will improve 5xSTS to 12 seconds or less to demonstrate clinically significant improvement in LE strength and ability to transfer pain free Baseline: 03/18/2024: 13.47s (7.72.6 sec) Goal status: INITIAL    PLAN: PT FREQUENCY: 1-2x/week  PT DURATION: 12 weeks  PLANNED INTERVENTIONS: Therapeutic exercises, Therapeutic activity, Neuromuscular re-education, Balance training, Gait training, Patient/Family education, Self Care, Joint mobilization, Joint manipulationOrthotic/Fit training, DME instructions, Dry Needling, Electrical stimulation, Spinal manipulation, Spinal mobilization, Cryotherapy, Moist heat, Taping, Traction, Ultrasound, Manual therapy, and Re-evaluation.  PLAN FOR NEXT SESSION: Progress Hip Strength and core stabilization, manual techniques PRN    Satira Curet PT, DPT 04/10/2024, 12:28 PM

## 2024-04-14 ENCOUNTER — Other Ambulatory Visit: Payer: Self-pay | Admitting: Family Medicine

## 2024-04-14 DIAGNOSIS — G8929 Other chronic pain: Secondary | ICD-10-CM

## 2024-04-14 DIAGNOSIS — M5416 Radiculopathy, lumbar region: Secondary | ICD-10-CM

## 2024-04-15 ENCOUNTER — Telehealth: Payer: Self-pay

## 2024-04-15 ENCOUNTER — Ambulatory Visit

## 2024-04-15 MED ORDER — OXYCODONE-ACETAMINOPHEN 10-325 MG PO TABS: 1.0000 | ORAL_TABLET | ORAL | 0 refills | Status: DC | PRN

## 2024-04-15 NOTE — Telephone Encounter (Signed)
 Called patient regarding missed appointment. Patient with understanding that today's appt was scheduled for tomorrow. Advised patient of no show policy and following appointment. Pt agreeable and without further questions at end of call.

## 2024-04-17 ENCOUNTER — Encounter

## 2024-04-18 ENCOUNTER — Ambulatory Visit

## 2024-04-18 DIAGNOSIS — M6281 Muscle weakness (generalized): Secondary | ICD-10-CM

## 2024-04-18 DIAGNOSIS — M5459 Other low back pain: Secondary | ICD-10-CM

## 2024-04-18 DIAGNOSIS — R2689 Other abnormalities of gait and mobility: Secondary | ICD-10-CM

## 2024-04-18 NOTE — Therapy (Signed)
 OUTPATIENT PHYSICAL THERAPY THORACOLUMBAR TREATMENT   Patient Name: Cheryl Payne MRN: [id number] DOB:[birth year], 58 y.o., female Today's Date: 04/18/2024  END OF SESSION:  PT End of Session - 04/18/24 0857     Visit Number 7    Number of Visits 25    Date for PT Re-Evaluation 06/04/24    Progress Note Due on Visit 10    PT Start Time 0858    PT Stop Time 0940    PT Time Calculation (min) 42 min    Activity Tolerance Patient tolerated treatment well    Behavior During Therapy [facility] for tasks assessed/performed               Past Medical History:  Diagnosis Date   Anxiety NA   Arthritis    Blood transfusion without reported diagnosis 2023   When i was in the hospital   Cancer [facility])    lung   Chronic left shoulder pain    Depression    GERD (gastroesophageal reflux disease)    Hypertension    Ulcer 2023   When i was in the hospital   Past Surgical History:  Procedure Laterality Date   COLONOSCOPY WITH PROPOFOL  N/A 03/24/2023   Procedure: COLONOSCOPY WITH PROPOFOL ;  Surgeon: Luke Salaam, MD;  Location: [facility] ENDOSCOPY;  Service: Gastroenterology;  Laterality: N/A;   ESOPHAGOGASTRODUODENOSCOPY (EGD) WITH PROPOFOL  N/A 08/17/2022   Procedure: ESOPHAGOGASTRODUODENOSCOPY (EGD) WITH PROPOFOL ;  Surgeon: Luke Salaam, MD;  Location: [facility] ENDOSCOPY;  Service: Gastroenterology;  Laterality: N/A;   ESOPHAGOGASTRODUODENOSCOPY (EGD) WITH PROPOFOL  N/A 03/24/2023   Procedure: ESOPHAGOGASTRODUODENOSCOPY (EGD) WITH PROPOFOL ;  Surgeon: Luke Salaam, MD;  Location: [facility] ENDOSCOPY;  Service: Gastroenterology;  Laterality: N/A;   LUNG SURGERY     Patient Active Problem List   Diagnosis Date Noted   Blood on toilet paper 03/24/2023   Adenomatous polyp of colon 03/24/2023   Gastric ulcer 03/24/2023   GI bleed 08/17/2022   Upper GI bleed 08/16/2022   Leukocytosis 08/16/2022   Chronic left-sided low back pain with left-sided sciatica 09/21/2021   Pure hypercholesterolemia 07/13/2020    Major depressive disorder, recurrent, in partial remission (HCC) 07/13/2020   GAD (generalized anxiety disorder) 07/13/2020   Encounter for screening mammogram for malignant neoplasm of breast 07/13/2020   Muscle spasm of left shoulder 07/13/2020   Chronic left shoulder pain 07/13/2020   Centrilobular emphysema (HCC) 07/13/2020   Moderate aortic valve insufficiency 07/13/2020   Obesity (BMI 30.0-34.9) 07/13/2020   DOE (dyspnea on exertion) 01/10/2020   Non-small cell carcinoma of lung (HCC) 01/02/2019   Cancer of upper lobe of right lung (HCC) 11/15/2018   Lung mass 10/02/2018   Essential hypertension 09/02/2014    PCP: Raina Bunting, DO  REFERRING PROVIDER: Ludwig Safer, PA-C  REFERRING DIAG:  [phone] (ICD-10-CM) - Lumbar radiculopathy    RATIONALE FOR EVALUATION AND TREATMENT: Rehabilitation  THERAPY DIAG: Other low back pain  Muscle weakness (generalized)  Other abnormalities of gait and mobility  ONSET DATE: Chronic (2019)  FOLLOW-UP APPT SCHEDULED WITH REFERRING PROVIDER: Yes    SUBJECTIVE:  SUBJECTIVE STATEMENT:  Chronic Low Back Pain   PERTINENT HISTORY:   Patient reports that she has had lower back pain since 2019. She reports that she has seen her provider and received multiple steroid injections with little relief.  Patient reports that pain starts in the lower back and refers down the posterior side of the L leg and ends the toes. Numbness and tingling are present in the posterior thigh and plantar aspect of the left foot. No relief from recent prescription (oxycodone , tremedol, hydrocodone ). Patient states that the pain intensifies with prolonged walking, sitting or standing. She reports that she spends most of the day on the floor or lying down due to the radiating  pain. She reports that pain in the L leg that disrupts her sleep throughout the night. Denies Saddle Anaesthesia, loss b/b, fever/chills.   Imaging Per Chart Review (02/26/2024):   EXAM: LUMBAR SPINE - COMPLETE 4+ VIEW   COMPARISON:  MRI lumbar spine January 18, 2024   FINDINGS: Minimal anterior osteophytic changes involving the L3-4 disc space. There is preservation of the height of the lumbar vertebral bodies and intervertebral disc spaces   No spondylolysis or listhesis   IMPRESSION: Minimal degenerative changes  PAIN:    Pain Intensity: Present: 9/10, Best: 5/10, Worst: 10/10 Pain location: lower back, Left posterior thigh and feet  Pain Quality: sharp and stabbing  Radiating: Yes  Numbness/Tingling: Yes Focal Weakness: No Aggravating factors: Prolonged walking, standing, sitting  Relieving factors: Rest, lying on back,  How long can you sit: < 10 min  How long can you stand: < 10 min  History of prior back injury, pain, surgery, or therapy: Yes Dominant hand: right  PRECAUTIONS: Fall  WEIGHT BEARING RESTRICTIONS: No  FALLS: Has patient fallen in last 6 months? No  Living Environment Lives with: Alone  Lives in: House/apartment Stairs: No Has following equipment at home: None  Prior level of function: Independent  Occupational demands: Retired   Presenter, broadcasting: Engineer, water, Cooking   Patient Goals: Relieve Pain and return PLOF    OBJECTIVE:  Patient Surveys    Cognition Patient is oriented to person, place, and time.  Attention span and concentration are intact.  Expressive speech is intact.     Gross Musculoskeletal Assessment Tremor: None Bulk: Normal Tone: Normal No visible step-off along spinal column, no signs of scoliosis  GAIT: Distance walked: 45m Assistive device utilized: None Level of assistance: Complete Independence Comments: Antalgic, Narrow Step Width, bilateral knee valgus present  Posture: Lumbar lordosis: WNL Iliac crest  height: Equal bilaterally Lumbar lateral shift: Negative  AROM AROM (Normal range in degrees) AROM   Lumbar   Flexion (65) 85% limited due to hamstring tightness  Extension (30) 100%  Right lateral flexion (25) 100%  Left lateral flexion (25) 100%  Right rotation (30) 100%  Left rotation (30) 100%      Hip Right Left  Flexion (125) WNL WNL  Extension (15)    Abduction (40)    Adduction     Internal Rotation (45) 30 30  External Rotation (45) 30 20*      Knee    Flexion (135) WNL WNL  Extension (0) WNL WNL      Ankle    Dorsiflexion (20)    Plantarflexion (50)    Inversion (35)    Eversion (15)    (* = pain; Blank rows = not tested)  LE MMT: MMT (out of 5) Right  Left   Hip flexion 3+ 3+  Hip extension    Hip abduction 4 3+  Hip adduction    Hip internal rotation    Hip external rotation    Knee flexion 4+ 4-  Knee extension 4+ 4-  Ankle dorsiflexion    Ankle plantarflexion    Ankle inversion    Ankle eversion    (* = pain; Blank rows = not tested)  Sensation Grossly intact to light touch throughout bilateral LEs as determined by testing dermatomes.   Intermittent sharp pain noted in R LE  in L5-S1 dermatomal pattern.  Reflexes Not Test  Muscle Length Hamstrings: R: Negative 20 L: Positive: 40 with reproduction of pain  Palpation Location Right Left         Lumbar paraspinals 0 0  Quadratus Lumborum 0 0  Gluteus Maximus 1 1  Gluteus Medius 1 1  Deep hip external rotators 1 2  PSIS    Fortin's Area (SIJ)    Greater Trochanter 1 2  (Blank rows = not tested) Graded on 0-4 scale (0 = no pain, 1 = pain, 2 = pain with wincing/grimacing/flinching, 3 = pain with withdrawal, 4 = unwilling to allow palpation)  Passive Accessory Intervertebral Motion Pt reports reproduction of back pain with CPA L4-S1 segments with no improvements following prolonged CPA.   Special Tests Lumbar Radiculopathy and Discogenic: Centralization and Peripheralization  (SN 92, -LR 0.12): Not Tested  Slump (SN 83, -LR 0.32): Not Tested SLR (SN 92, -LR 0.29): Not Tested  Hip: FABER (SN 81): R: Negative L: Negative FADIR (SN 94): R: Negative L: Negative  Functional Tasks  Deep squat: Narrow BOS, Hip hinge absent, decreased ankle dorsiflexion   Sit to stand: Use of BUE support, minor knee valgus present  Forward Step-Down Test: R: Pain with Knee Valgus present   TODAY'S TREATMENT: DATE: 04/18/2024   Subjective: Pt returned from family reunion this past weekend. Reports that she had leg pain and weakness due to increased walking activity. Pt reports that the vacation was fast paced and she was moving around a lot. 0/10 pain currently. No questions or concerns.   Therapeutic Exercise:  Supine Extended Bridge on Heels for hamstring and core stabilization;  3 x 10 with green TB around knees   Supine Bridge with LE on Swiss Ball (red)   2 x 10   Dead bug with Swiss ball in between UE/LE;  2 x 10, alternating LE only   Hip Matrix   Hip Abduction    R/L: 2 x 10 25#   There.act:  Forward Step Up into Runners Pose   R/L: 1 x 10   R/L: 1 x 10 with 3# DB in each UE  - Minor imbalance with L SLS, ankle and hip right reactions throughout exercises with L SLS.   STS from 19" mat table for LE strength, core stability and shoulder strengthening   3 x 10, 3 Kg Med Ball in [address] [employer]), Green TB around the knees   Lateral Stepping with Resistance  1 x 25' Blue TB around knees   1 x 25' Blue TB around knees  Lateral Lunge onto Step up   1 x 10 R/L   1 x 10 R/L with pallof press, Blue TB   Time spent reviewing Seated Hamstring Stretch and Supine Piriformis stretch with return demonstration from patient. PT encouraged proper frequency. Good return demonstration following multimodal cues for proper joint positioning.   PATIENT EDUCATION:  Education details: HEP, Exercises  Person educated: Patient Education method:  Explanation, Tactile cues,  Verbal cues, and Handouts Education comprehension: verbalized understanding, returned demonstration, and needs further education   HOME EXERCISE PROGRAM:  Access Code: [email or web address] URL: [email or web address] Date: 04/08/2024 Prepared by: Veryl Gottron Walter Min  Exercises - Supine Figure 4 Piriformis Stretch  - 1 x daily - 7 x weekly - 3 sets - 30-60 hold - Supine Single Knee to Chest Stretch  - 1 x daily - 7 x weekly - 3 sets - 30-60 hold - Seated Hamstring Stretch  - 1 x daily - 7 x weekly - 3 sets - 30-60 hold - Bridge with Hip Abduction and Resistance  - 1 x daily - 3-4 x weekly - 2-3 sets - 10-12 reps - Clam with Resistance  - 1 x daily - 3-4 x weekly - 2-3 sets - 10-12 reps - Bird Dog  - 1 x daily - 3-4 x weekly - 2-3 sets - 10-12 reps - Supine Dead Bug with Leg Extension  - 1 x daily - 3-4 x weekly - 2-3 sets - 10-12 reps - Side Plank on Knees  - 1 x daily - 3-4 x weekly - 2 sets - 3 reps - 10s hold  Access Code: [email or web address] URL: [email or web address] Date: 03/12/2024 Prepared by: Veryl Gottron Ciarra Braddy  Exercises - Supine Figure 4 Piriformis Stretch  - 1 x daily - 7 x weekly - 3 sets - 30-60 hold - Supine Single Knee to Chest Stretch  - 1 x daily - 7 x weekly - 3 sets - 30-60 hold - Seated Hamstring Stretch  - 1 x daily - 7 x weekly - 3 sets - 30-60 hold - Bridge with Hip Abduction and Resistance  - 1 x daily - 3-4 x weekly - 2-3 sets - 10-12 reps - Clam with Resistance  - 1 x daily - 3-4 x weekly - 2-3 sets - 10-12 reps   ASSESSMENT:  CLINICAL IMPRESSION: Pt arrived to OPPT with continued focus on improving core stabilization. Pt maintained progress throughout long weekend with increased activity and walking. She was adherent with HEP in order to reduce tension in back and L leg. PT more focused on functional strengthening today; she tolerated aall exercises without report of additional pain in lower back. SLS on the L with notable weakness; pt with notable ankle and  hip righting reactions, no improvements with increased reps. Updates to HEP today. Based on today's session patient will continue to benefit from skilled physical therapy to increase core stabilization and functional strength in order to improve overall QoL and maximize return to PLOF.   OBJECTIVE IMPAIRMENTS: Abnormal gait, decreased activity tolerance, difficulty walking, decreased ROM, decreased strength, and pain.   ACTIVITY LIMITATIONS: carrying, bending, sitting, standing, squatting, and sleeping  PARTICIPATION LIMITATIONS: driving, shopping, and community activity  PERSONAL FACTORS: Age, Social background, and Time since onset of injury/illness/exacerbation are also affecting patient's functional outcome.   REHAB POTENTIAL: Good  CLINICAL DECISION MAKING: Evolving/moderate complexity  EVALUATION COMPLEXITY: Moderate   GOALS: Goals reviewed with patient? No  SHORT TERM GOALS: Target date: 05/30/2024  Pt will be independent with HEP in order to improve strength and decrease back pain to improve pain-free function at home and work. Baseline: n/a Goal status: INITIAL   LONG TERM GOALS: Target date: 07/11/2024  Pt will be able to sit for 30-60 minutes without reporting discomfort in order to demonstrate significant improvements in household ADLs and return to PLOF.  Baseline: 04/02/2024: Pt reports sitting on the couch > 45 min Goal status: Progressing  2.  Pt will decrease worst back pain by at least 2 points on the NPRS in order to demonstrate clinically significant reduction in back pain. Baseline: 03/12/2024: 10/10 NPS Goal status: INITIAL  3.  Pt will decrease mODI score by at least 13 points in order demonstrate clinically significant reduction in back pain/disability.     Baseline: 03/18/2024: 31 / 50 = 62.0 % Goal status: INITIAL  4.  Pt will increase L Hip MMT by 1 grade in order to demonstrate clinically significant improvements in LE strength.  Baseline: 03/12/2024  L Hip Flex: 3+/5, Abd: 3+/5 Goal status: INITIAL  5.  Pt will improve 5xSTS to 12 seconds or less to demonstrate clinically significant improvement in LE strength and ability to transfer pain free Baseline: 03/18/2024: 13.47s (7.72.6 sec) Goal status: INITIAL    PLAN: PT FREQUENCY: 1-2x/week  PT DURATION: 12 weeks  PLANNED INTERVENTIONS: Therapeutic exercises, Therapeutic activity, Neuromuscular re-education, Balance training, Gait training, Patient/Family education, Self Care, Joint mobilization, Joint manipulationOrthotic/Fit training, DME instructions, Dry Needling, Electrical stimulation, Spinal manipulation, Spinal mobilization, Cryotherapy, Moist heat, Taping, Traction, Ultrasound, Manual therapy, and Re-evaluation.  PLAN FOR NEXT SESSION: Progress Hip Strength and core stabilization, manual techniques PRN    Satira Curet PT, DPT 04/18/2024, 9:40 AM

## 2024-04-23 ENCOUNTER — Ambulatory Visit

## 2024-04-23 DIAGNOSIS — R2689 Other abnormalities of gait and mobility: Secondary | ICD-10-CM | POA: Diagnosis not present

## 2024-04-23 DIAGNOSIS — M6281 Muscle weakness (generalized): Secondary | ICD-10-CM | POA: Diagnosis not present

## 2024-04-23 DIAGNOSIS — M5459 Other low back pain: Secondary | ICD-10-CM | POA: Diagnosis not present

## 2024-04-23 NOTE — Therapy (Signed)
 OUTPATIENT PHYSICAL THERAPY THORACOLUMBAR TREATMENT   Patient Name: Cheryl Payne MRN: [id number] DOB:[birth year], 58 y.o., female Today's Date: 04/23/2024  END OF SESSION:  PT End of Session - 04/23/24 1117     Visit Number 8    Number of Visits 25    Date for PT Re-Evaluation 06/04/24    Progress Note Due on Visit 10    PT Start Time 1116    PT Stop Time 1158    PT Time Calculation (min) 42 min    Activity Tolerance Patient tolerated treatment well    Behavior During Therapy WFL for tasks assessed/performed               Past Medical History:  Diagnosis Date   Anxiety NA   Arthritis    Blood transfusion without reported diagnosis 2023   When i was in the hospital   Cancer [facility])    lung   Chronic left shoulder pain    Depression    GERD (gastroesophageal reflux disease)    Hypertension    Ulcer 2023   When i was in the hospital   Past Surgical History:  Procedure Laterality Date   COLONOSCOPY WITH PROPOFOL  N/A 03/24/2023   Procedure: COLONOSCOPY WITH PROPOFOL ;  Surgeon: Luke Salaam, MD;  Location: [facility] ENDOSCOPY;  Service: Gastroenterology;  Laterality: N/A;   ESOPHAGOGASTRODUODENOSCOPY (EGD) WITH PROPOFOL  N/A 08/17/2022   Procedure: ESOPHAGOGASTRODUODENOSCOPY (EGD) WITH PROPOFOL ;  Surgeon: Luke Salaam, MD;  Location: [facility] ENDOSCOPY;  Service: Gastroenterology;  Laterality: N/A;   ESOPHAGOGASTRODUODENOSCOPY (EGD) WITH PROPOFOL  N/A 03/24/2023   Procedure: ESOPHAGOGASTRODUODENOSCOPY (EGD) WITH PROPOFOL ;  Surgeon: Luke Salaam, MD;  Location: [facility] ENDOSCOPY;  Service: Gastroenterology;  Laterality: N/A;   LUNG SURGERY     Patient Active Problem List   Diagnosis Date Noted   Blood on toilet paper 03/24/2023   Adenomatous polyp of colon 03/24/2023   Gastric ulcer 03/24/2023   GI bleed 08/17/2022   Upper GI bleed 08/16/2022   Leukocytosis 08/16/2022   Chronic left-sided low back pain with left-sided sciatica 09/21/2021   Pure hypercholesterolemia 07/13/2020    Major depressive disorder, recurrent, in partial remission (HCC) 07/13/2020   GAD (generalized anxiety disorder) 07/13/2020   Encounter for screening mammogram for malignant neoplasm of breast 07/13/2020   Muscle spasm of left shoulder 07/13/2020   Chronic left shoulder pain 07/13/2020   Centrilobular emphysema (HCC) 07/13/2020   Moderate aortic valve insufficiency 07/13/2020   Obesity (BMI 30.0-34.9) 07/13/2020   DOE (dyspnea on exertion) 01/10/2020   Non-small cell carcinoma of lung (HCC) 01/02/2019   Cancer of upper lobe of right lung (HCC) 11/15/2018   Lung mass 10/02/2018   Essential hypertension 09/02/2014    PCP: Raina Bunting, DO  REFERRING PROVIDER: Ludwig Safer, PA-C  REFERRING DIAG:  [phone] (ICD-10-CM) - Lumbar radiculopathy    RATIONALE FOR EVALUATION AND TREATMENT: Rehabilitation  THERAPY DIAG: Other low back pain  Muscle weakness (generalized)  Other abnormalities of gait and mobility  ONSET DATE: Chronic (2019)  FOLLOW-UP APPT SCHEDULED WITH REFERRING PROVIDER: Yes    SUBJECTIVE:  SUBJECTIVE STATEMENT:  Chronic Low Back Pain   PERTINENT HISTORY:   Patient reports that she has had lower back pain since 2019. She reports that she has seen her provider and received multiple steroid injections with little relief.  Patient reports that pain starts in the lower back and refers down the posterior side of the L leg and ends the toes. Numbness and tingling are present in the posterior thigh and plantar aspect of the left foot. No relief from recent prescription (oxycodone , tremedol, hydrocodone ). Patient states that the pain intensifies with prolonged walking, sitting or standing. She reports that she spends most of the day on the floor or lying down due to the radiating  pain. She reports that pain in the L leg that disrupts her sleep throughout the night. Denies Saddle Anaesthesia, loss b/b, fever/chills.   Imaging Per Chart Review (02/26/2024):   EXAM: LUMBAR SPINE - COMPLETE 4+ VIEW   COMPARISON:  MRI lumbar spine January 18, 2024   FINDINGS: Minimal anterior osteophytic changes involving the L3-4 disc space. There is preservation of the height of the lumbar vertebral bodies and intervertebral disc spaces   No spondylolysis or listhesis   IMPRESSION: Minimal degenerative changes  PAIN:    Pain Intensity: Present: 9/10, Best: 5/10, Worst: 10/10 Pain location: lower back, Left posterior thigh and feet  Pain Quality: sharp and stabbing  Radiating: Yes  Numbness/Tingling: Yes Focal Weakness: No Aggravating factors: Prolonged walking, standing, sitting  Relieving factors: Rest, lying on back,  How long can you sit: < 10 min  How long can you stand: < 10 min  History of prior back injury, pain, surgery, or therapy: Yes Dominant hand: right  PRECAUTIONS: Fall  WEIGHT BEARING RESTRICTIONS: No  FALLS: Has patient fallen in last 6 months? No  Living Environment Lives with: Alone  Lives in: House/apartment Stairs: No Has following equipment at home: None  Prior level of function: Independent  Occupational demands: Retired   Presenter, broadcasting: Engineer, water, Cooking   Patient Goals: Relieve Pain and return PLOF    OBJECTIVE:  Patient Surveys    Cognition Patient is oriented to person, place, and time.  Attention span and concentration are intact.  Expressive speech is intact.     Gross Musculoskeletal Assessment Tremor: None Bulk: Normal Tone: Normal No visible step-off along spinal column, no signs of scoliosis  GAIT: Distance walked: 48m Assistive device utilized: None Level of assistance: Complete Independence Comments: Antalgic, Narrow Step Width, bilateral knee valgus present  Posture: Lumbar lordosis: WNL Iliac crest  height: Equal bilaterally Lumbar lateral shift: Negative  AROM AROM (Normal range in degrees) AROM   Lumbar   Flexion (65) 85% limited due to hamstring tightness  Extension (30) 100%  Right lateral flexion (25) 100%  Left lateral flexion (25) 100%  Right rotation (30) 100%  Left rotation (30) 100%      Hip Right Left  Flexion (125) WNL WNL  Extension (15)    Abduction (40)    Adduction     Internal Rotation (45) 30 30  External Rotation (45) 30 20*      Knee    Flexion (135) WNL WNL  Extension (0) WNL WNL      Ankle    Dorsiflexion (20)    Plantarflexion (50)    Inversion (35)    Eversion (15)    (* = pain; Blank rows = not tested)  LE MMT: MMT (out of 5) Right  Left   Hip flexion 3+ 3+  Hip extension    Hip abduction 4 3+  Hip adduction    Hip internal rotation    Hip external rotation    Knee flexion 4+ 4-  Knee extension 4+ 4-  Ankle dorsiflexion    Ankle plantarflexion    Ankle inversion    Ankle eversion    (* = pain; Blank rows = not tested)  Sensation Grossly intact to light touch throughout bilateral LEs as determined by testing dermatomes.   Intermittent sharp pain noted in R LE  in L5-S1 dermatomal pattern.  Reflexes Not Test  Muscle Length Hamstrings: R: Negative 20 L: Positive: 40 with reproduction of pain  Palpation Location Right Left         Lumbar paraspinals 0 0  Quadratus Lumborum 0 0  Gluteus Maximus 1 1  Gluteus Medius 1 1  Deep hip external rotators 1 2  PSIS    Fortin's Area (SIJ)    Greater Trochanter 1 2  (Blank rows = not tested) Graded on 0-4 scale (0 = no pain, 1 = pain, 2 = pain with wincing/grimacing/flinching, 3 = pain with withdrawal, 4 = unwilling to allow palpation)  Passive Accessory Intervertebral Motion Pt reports reproduction of back pain with CPA L4-S1 segments with no improvements following prolonged CPA.   Special Tests Lumbar Radiculopathy and Discogenic: Centralization and Peripheralization  (SN 92, -LR 0.12): Not Tested  Slump (SN 83, -LR 0.32): Not Tested SLR (SN 92, -LR 0.29): Not Tested  Hip: FABER (SN 81): R: Negative L: Negative FADIR (SN 94): R: Negative L: Negative  Functional Tasks  Deep squat: Narrow BOS, Hip hinge absent, decreased ankle dorsiflexion   Sit to stand: Use of BUE support, minor knee valgus present  Forward Step-Down Test: R: Pain with Knee Valgus present   TODAY'S TREATMENT: DATE: 04/23/2024   Subjective: Patient reports 0/10 pain but increased tightness in the L posterior thigh. Pt reports that she has continued L thigh. It radiates from L buttock to L knee. No questions or concerns.   Therapeutic Exercise:    Passive hamstring Stretch    Supine    30s/bout x 4 in order to improve ROM and tissue extensibility   Supine Extended Bridge on Heels for hamstring and core stabilization;  3 x 10 with green TB;  R Sidelying Clamshell  1 x 12 Green TB   2 x 12 Blue TB    Hip Matrix   Hip Abduction    R/L: 2 x 10 25#   Seated Hamstring Stretch  L: 30s/bout x 3 in order to improve pain and tissue extensibility in hamstring   Supine Piriformis Stretch   L: 30s/bout x 3 in order to improve pain and tissue extensibility in gluteal muscles   Supine Sciatic Nerve Glide   L leg only: 1 x10  There.act:  Partial Squat with Kettlebell in BUE, Blue TB around knees for gluteal strengthen  2 x 10, 10# KB   Lateral Stepping with Resistance  1 x 25' Blue TB around knees   1 x 25' Blue TB around knees  VC for hip hinge and knee bend    Pallof Press at [employer]    2 x 10 with resistance from L   PATIENT EDUCATION:  Education details: HEP, Exercises  Person educated: Patient Education method: Explanation, Actor cues, Verbal cues, and Handouts Education comprehension: verbalized understanding, returned demonstration, and needs further education   HOME EXERCISE PROGRAM:  Access Code: [email or web address] URL:  [email or web address] Date: 04/23/2024  Prepared by: Satira Curet  Exercises - Supine Figure 4 Piriformis Stretch  - 1 x daily - 7 x weekly - 3 sets - 30-60 hold - Supine Single Knee to Chest Stretch  - 1 x daily - 7 x weekly - 3 sets - 30-60 hold - Seated Hamstring Stretch  - 1 x daily - 7 x weekly - 3 sets - 30-60 hold - Supine Sciatic Nerve Glide  - 1 x daily - 7 x weekly - 1 sets - 10-12 reps - Bridge with Hip Abduction and Resistance  - 1 x daily - 3-4 x weekly - 2-3 sets - 10-12 reps - Clam with Resistance  - 1 x daily - 3-4 x weekly - 2-3 sets - 10-12 reps - Bird Dog  - 1 x daily - 3-4 x weekly - 2-3 sets - 10-12 reps - Supine Dead Bug with Leg Extension  - 1 x daily - 3-4 x weekly - 2-3 sets - 10-12 reps - Side Plank on Knees  - 1 x daily - 3-4 x weekly - 2 sets - 3 reps - 10s hold  Access Code: [email or web address] URL: [email or web address] Date: 04/08/2024 Prepared by: Veryl Gottron Armelia Penton  Exercises - Supine Figure 4 Piriformis Stretch  - 1 x daily - 7 x weekly - 3 sets - 30-60 hold - Supine Single Knee to Chest Stretch  - 1 x daily - 7 x weekly - 3 sets - 30-60 hold - Seated Hamstring Stretch  - 1 x daily - 7 x weekly - 3 sets - 30-60 hold - Bridge with Hip Abduction and Resistance  - 1 x daily - 3-4 x weekly - 2-3 sets - 10-12 reps - Clam with Resistance  - 1 x daily - 3-4 x weekly - 2-3 sets - 10-12 reps - Bird Dog  - 1 x daily - 3-4 x weekly - 2-3 sets - 10-12 reps - Supine Dead Bug with Leg Extension  - 1 x daily - 3-4 x weekly - 2-3 sets - 10-12 reps - Side Plank on Knees  - 1 x daily - 3-4 x weekly - 2 sets - 3 reps - 10s hold   ASSESSMENT:  CLINICAL IMPRESSION: Continued PT POC with continued focus on improving core stabilization and reduce lumbar radiculopathy symptoms. She continues to have improved low back pain consistent with 0/10. However patient has had reoccurring radiating thigh pain starting from the buttocks into the posterior knee. PT  suspects signs and symptoms are consistent with piriformis syndrome. Pt tolerated interventions focused on L gluteal strengthening and stretching in order to mitigate reported symptoms. PT demo of sciatic nerve glides with good return demo from patient. Based on today's session patient will continue to benefit from skilled physical therapy to increase core stabilization and functional strength in order to improve overall QoL and maximize return to PLOF.    OBJECTIVE IMPAIRMENTS: Abnormal gait, decreased activity tolerance, difficulty walking, decreased ROM, decreased strength, and pain.   ACTIVITY LIMITATIONS: carrying, bending, sitting, standing, squatting, and sleeping  PARTICIPATION LIMITATIONS: driving, shopping, and community activity  PERSONAL FACTORS: Age, Social background, and Time since onset of injury/illness/exacerbation are also affecting patient's functional outcome.   REHAB POTENTIAL: Good  CLINICAL DECISION MAKING: Evolving/moderate complexity  EVALUATION COMPLEXITY: Moderate   GOALS: Goals reviewed with patient? No  SHORT TERM GOALS: Target date: 06/04/2024  Pt will be independent with HEP in order to improve strength and decrease back pain to improve pain-free function at home and work. Baseline:  n/a Goal status: INITIAL   LONG TERM GOALS: Target date: 07/16/2024  Pt will be able to sit for 30-60 minutes without reporting discomfort in order to demonstrate significant improvements in household ADLs and return to PLOF.  Baseline: 04/02/2024: Pt reports sitting on the couch > 45 min Goal status: Progressing  2.  Pt will decrease worst back pain by at least 2 points on the NPRS in order to demonstrate clinically significant reduction in back pain. Baseline: 03/12/2024: 10/10 NPS Goal status: INITIAL  3.  Pt will decrease mODI score by at least 13 points in order demonstrate clinically significant reduction in back pain/disability.     Baseline: 03/18/2024: 31 / 50 =  62.0 % Goal status: INITIAL  4.  Pt will increase L Hip MMT by 1 grade in order to demonstrate clinically significant improvements in LE strength.  Baseline: 03/12/2024 L Hip Flex: 3+/5, Abd: 3+/5 Goal status: INITIAL  5.  Pt will improve 5xSTS to 12 seconds or less to demonstrate clinically significant improvement in LE strength and ability to transfer pain free Baseline: 03/18/2024: 13.47s (7.72.6 sec) Goal status: INITIAL    PLAN: PT FREQUENCY: 1-2x/week  PT DURATION: 12 weeks  PLANNED INTERVENTIONS: Therapeutic exercises, Therapeutic activity, Neuromuscular re-education, Balance training, Gait training, Patient/Family education, Self Care, Joint mobilization, Joint manipulationOrthotic/Fit training, DME instructions, Dry Needling, Electrical stimulation, Spinal manipulation, Spinal mobilization, Cryotherapy, Moist heat, Taping, Traction, Ultrasound, Manual therapy, and Re-evaluation.  PLAN FOR NEXT SESSION: Progress Hip Strength and core stabilization, manual techniques PRN    Satira Curet PT, DPT 04/23/2024, 12:22 PM

## 2024-04-25 ENCOUNTER — Ambulatory Visit

## 2024-04-25 DIAGNOSIS — M5459 Other low back pain: Secondary | ICD-10-CM

## 2024-04-25 DIAGNOSIS — M6281 Muscle weakness (generalized): Secondary | ICD-10-CM | POA: Diagnosis not present

## 2024-04-25 DIAGNOSIS — R2689 Other abnormalities of gait and mobility: Secondary | ICD-10-CM | POA: Diagnosis not present

## 2024-04-25 NOTE — Therapy (Signed)
 OUTPATIENT PHYSICAL THERAPY THORACOLUMBAR TREATMENT   Patient Name: Cheryl Payne MRN: [id number] DOB:03-31-1966, 58 y.o., female Today's Date: 04/25/2024  END OF SESSION:  PT End of Session - 04/25/24 1117     Visit Number 9    Number of Visits 25    Date for PT Re-Evaluation 06/04/24    Progress Note Due on Visit 10    PT Start Time 1116    PT Stop Time 1158    PT Time Calculation (min) 42 min    Activity Tolerance Patient tolerated treatment well    Behavior During Therapy WFL for tasks assessed/performed               Past Medical History:  Diagnosis Date   Anxiety NA   Arthritis    Blood transfusion without reported diagnosis 2023   When i was in the hospital   Cancer [facility])    lung   Chronic left shoulder pain    Depression    GERD (gastroesophageal reflux disease)    Hypertension    Ulcer 2023   When i was in the hospital   Past Surgical History:  Procedure Laterality Date   COLONOSCOPY WITH PROPOFOL  N/A 03/24/2023   Procedure: COLONOSCOPY WITH PROPOFOL ;  Surgeon: Luke Salaam, MD;  Location: [facility] ENDOSCOPY;  Service: Gastroenterology;  Laterality: N/A;   ESOPHAGOGASTRODUODENOSCOPY (EGD) WITH PROPOFOL  N/A 08/17/2022   Procedure: ESOPHAGOGASTRODUODENOSCOPY (EGD) WITH PROPOFOL ;  Surgeon: Luke Salaam, MD;  Location: [facility] ENDOSCOPY;  Service: Gastroenterology;  Laterality: N/A;   ESOPHAGOGASTRODUODENOSCOPY (EGD) WITH PROPOFOL  N/A 03/24/2023   Procedure: ESOPHAGOGASTRODUODENOSCOPY (EGD) WITH PROPOFOL ;  Surgeon: Luke Salaam, MD;  Location: [facility] ENDOSCOPY;  Service: Gastroenterology;  Laterality: N/A;   LUNG SURGERY     Patient Active Problem List   Diagnosis Date Noted   Blood on toilet paper 03/24/2023   Adenomatous polyp of colon 03/24/2023   Gastric ulcer 03/24/2023   GI bleed 08/17/2022   Upper GI bleed 08/16/2022   Leukocytosis 08/16/2022   Chronic left-sided low back pain with left-sided sciatica 09/21/2021   Pure hypercholesterolemia 07/13/2020    Major depressive disorder, recurrent, in partial remission (HCC) 07/13/2020   GAD (generalized anxiety disorder) 07/13/2020   Encounter for screening mammogram for malignant neoplasm of breast 07/13/2020   Muscle spasm of left shoulder 07/13/2020   Chronic left shoulder pain 07/13/2020   Centrilobular emphysema (HCC) 07/13/2020   Moderate aortic valve insufficiency 07/13/2020   Obesity (BMI 30.0-34.9) 07/13/2020   DOE (dyspnea on exertion) 01/10/2020   Non-small cell carcinoma of lung (HCC) 01/02/2019   Cancer of upper lobe of right lung (HCC) 11/15/2018   Lung mass 10/02/2018   Essential hypertension 09/02/2014    PCP: Raina Bunting, DO  REFERRING PROVIDER: Ludwig Safer, PA-C  REFERRING DIAG:  [phone] (ICD-10-CM) - Lumbar radiculopathy    RATIONALE FOR EVALUATION AND TREATMENT: Rehabilitation  THERAPY DIAG: Other low back pain  Muscle weakness (generalized)  Other abnormalities of gait and mobility  ONSET DATE: Chronic (2019)  FOLLOW-UP APPT SCHEDULED WITH REFERRING PROVIDER: Yes    SUBJECTIVE:  SUBJECTIVE STATEMENT:  Chronic Low Back Pain   PERTINENT HISTORY:   Patient reports that she has had lower back pain since 2019. She reports that she has seen her provider and received multiple steroid injections with little relief.  Patient reports that pain starts in the lower back and refers down the posterior side of the L leg and ends the toes. Numbness and tingling are present in the posterior thigh and plantar aspect of the left foot. No relief from recent prescription (oxycodone , tremedol, hydrocodone ). Patient states that the pain intensifies with prolonged walking, sitting or standing. She reports that she spends most of the day on the floor or lying down due to the radiating  pain. She reports that pain in the L leg that disrupts her sleep throughout the night. Denies Saddle Anaesthesia, loss b/b, fever/chills.   Imaging Per Chart Review (02/26/2024):   EXAM: LUMBAR SPINE - COMPLETE 4+ VIEW   COMPARISON:  MRI lumbar spine January 18, 2024   FINDINGS: Minimal anterior osteophytic changes involving the L3-4 disc space. There is preservation of the height of the lumbar vertebral bodies and intervertebral disc spaces   No spondylolysis or listhesis   IMPRESSION: Minimal degenerative changes  PAIN:    Pain Intensity: Present: 9/10, Best: 5/10, Worst: 10/10 Pain location: lower back, Left posterior thigh and feet  Pain Quality: sharp and stabbing  Radiating: Yes  Numbness/Tingling: Yes Focal Weakness: No Aggravating factors: Prolonged walking, standing, sitting  Relieving factors: Rest, lying on back,  How long can you sit: < 10 min  How long can you stand: < 10 min  History of prior back injury, pain, surgery, or therapy: Yes Dominant hand: right  PRECAUTIONS: Fall  WEIGHT BEARING RESTRICTIONS: No  FALLS: Has patient fallen in last 6 months? No  Living Environment Lives with: Alone  Lives in: House/apartment Stairs: No Has following equipment at home: None  Prior level of function: Independent  Occupational demands: Retired   Presenter, broadcasting: Engineer, water, Cooking   Patient Goals: Relieve Pain and return PLOF    OBJECTIVE:  Patient Surveys    Cognition Patient is oriented to person, place, and time.  Attention span and concentration are intact.  Expressive speech is intact.     Gross Musculoskeletal Assessment Tremor: None Bulk: Normal Tone: Normal No visible step-off along spinal column, no signs of scoliosis  GAIT: Distance walked: 4m Assistive device utilized: None Level of assistance: Complete Independence Comments: Antalgic, Narrow Step Width, bilateral knee valgus present  Posture: Lumbar lordosis: WNL Iliac crest  height: Equal bilaterally Lumbar lateral shift: Negative  AROM AROM (Normal range in degrees) AROM   Lumbar   Flexion (65) 85% limited due to hamstring tightness  Extension (30) 100%  Right lateral flexion (25) 100%  Left lateral flexion (25) 100%  Right rotation (30) 100%  Left rotation (30) 100%      Hip Right Left  Flexion (125) WNL WNL  Extension (15)    Abduction (40)    Adduction     Internal Rotation (45) 30 30  External Rotation (45) 30 20*      Knee    Flexion (135) WNL WNL  Extension (0) WNL WNL      Ankle    Dorsiflexion (20)    Plantarflexion (50)    Inversion (35)    Eversion (15)    (* = pain; Blank rows = not tested)  LE MMT: MMT (out of 5) Right  Left   Hip flexion 3+ 3+  Hip extension    Hip abduction 4 3+  Hip adduction    Hip internal rotation    Hip external rotation    Knee flexion 4+ 4-  Knee extension 4+ 4-  Ankle dorsiflexion    Ankle plantarflexion    Ankle inversion    Ankle eversion    (* = pain; Blank rows = not tested)  Sensation Grossly intact to light touch throughout bilateral LEs as determined by testing dermatomes.   Intermittent sharp pain noted in R LE  in L5-S1 dermatomal pattern.  Reflexes Not Test  Muscle Length Hamstrings: R: Negative 20 L: Positive: 40 with reproduction of pain  Palpation Location Right Left         Lumbar paraspinals 0 0  Quadratus Lumborum 0 0  Gluteus Maximus 1 1  Gluteus Medius 1 1  Deep hip external rotators 1 2  PSIS    Fortin's Area (SIJ)    Greater Trochanter 1 2  (Blank rows = not tested) Graded on 0-4 scale (0 = no pain, 1 = pain, 2 = pain with wincing/grimacing/flinching, 3 = pain with withdrawal, 4 = unwilling to allow palpation)  Passive Accessory Intervertebral Motion Pt reports reproduction of back pain with CPA L4-S1 segments with no improvements following prolonged CPA.   Special Tests Lumbar Radiculopathy and Discogenic: Centralization and Peripheralization  (SN 92, -LR 0.12): Not Tested  Slump (SN 83, -LR 0.32): Not Tested SLR (SN 92, -LR 0.29): Not Tested  Hip: FABER (SN 81): R: Negative L: Negative FADIR (SN 94): R: Negative L: Negative  Functional Tasks  Deep squat: Narrow BOS, Hip hinge absent, decreased ankle dorsiflexion   Sit to stand: Use of BUE support, minor knee valgus present  Forward Step-Down Test: R: Pain with Knee Valgus present   TODAY'S TREATMENT: DATE: 04/25/2024   Subjective: Patient reports 0/10 pain but increased tightness in the L posterior thigh. Pt reports that she has continued L thigh. It radiates from L buttock to L knee. No questions or concerns.   Therapeutic Exercise:  Supine Extended Bridge on Heels for hamstring and core stabilization;  2 x 12   1 x 12, Green TB around knee  Dead Bug Progression  2 x 10, Alternating LE only   1 x 8, Alternating LE and anti rotation with Green TB    PT Demo with return demonstration:  Supine Sciatic Nerve Glide    L leg only: 2 x 10    Supine Sciatic Nerve Tensioner     L Leg only: 1 x 10    Matrix Hip Abduction   R/L: 1 x 10 25#      2 x 10 30#    Sidelying Clamshell against resistance    R: 3 x 10, Blue TB    L: 3 x 10, Blue TB   There.act:   Sit to Stand from [employer] Table with arms crossed improve squat mechanics for lifting heavier objects     1 x 10    Partial Squats with Green TB around knees improve squat mechanics for lifting heavier objects    2 x 10    TRX Deep Squats  to improve squat mechanics for lifting heavier objects    1 x 10   1 x 5    Farmer's Carry for core stabilization, SLS balance and to improve capacity to carry heavier objects   1 x 24' 20# Kettle bell   1 x 24' 30# kettle bell  PATIENT EDUCATION:  Education details: HEP, Exercises  Person educated: Patient Education method: Explanation, Actor cues, Verbal cues, and Handouts Education comprehension: verbalized understanding, returned demonstration, and needs further  education   HOME EXERCISE PROGRAM:  Access Code: [email or web address] URL: [email or web address] Date: 04/23/2024 Prepared by: Veryl Gottron Abishai Viegas  Exercises - Supine Figure 4 Piriformis Stretch  - 1 x daily - 7 x weekly - 3 sets - 30-60 hold - Supine Single Knee to Chest Stretch  - 1 x daily - 7 x weekly - 3 sets - 30-60 hold - Seated Hamstring Stretch  - 1 x daily - 7 x weekly - 3 sets - 30-60 hold - Supine Sciatic Nerve Glide  - 1 x daily - 7 x weekly - 1 sets - 10-12 reps - Bridge with Hip Abduction and Resistance  - 1 x daily - 3-4 x weekly - 2-3 sets - 10-12 reps - Clam with Resistance  - 1 x daily - 3-4 x weekly - 2-3 sets - 10-12 reps - Bird Dog  - 1 x daily - 3-4 x weekly - 2-3 sets - 10-12 reps - Supine Dead Bug with Leg Extension  - 1 x daily - 3-4 x weekly - 2-3 sets - 10-12 reps - Side Plank on Knees  - 1 x daily - 3-4 x weekly - 2 sets - 3 reps - 10s hold  Access Code: [email or web address] URL: [email or web address] Date: 04/08/2024 Prepared by: Veryl Gottron Calden Dorsey  Exercises - Supine Figure 4 Piriformis Stretch  - 1 x daily - 7 x weekly - 3 sets - 30-60 hold - Supine Single Knee to Chest Stretch  - 1 x daily - 7 x weekly - 3 sets - 30-60 hold - Seated Hamstring Stretch  - 1 x daily - 7 x weekly - 3 sets - 30-60 hold - Bridge with Hip Abduction and Resistance  - 1 x daily - 3-4 x weekly - 2-3 sets - 10-12 reps - Clam with Resistance  - 1 x daily - 3-4 x weekly - 2-3 sets - 10-12 reps - Bird Dog  - 1 x daily - 3-4 x weekly - 2-3 sets - 10-12 reps - Supine Dead Bug with Leg Extension  - 1 x daily - 3-4 x weekly - 2-3 sets - 10-12 reps - Side Plank on Knees  - 1 x daily - 3-4 x weekly - 2 sets - 3 reps - 10s hold   ASSESSMENT:  CLINICAL IMPRESSION: Continued PT POC with continued focus on improving core stabilization and reduce lumbar radiculopathy symptoms. She continues to have improved low back pain consistent with 0/10. Patient with good carryover from last session  with return demonstration of sciatic nerve glide and Tensioner exercises. Progressed resistance with hip abduction exercises; no report of additional pain. Patient unable to maintain neutral pelvis with farmers carry, lateral lean to the L with heavier objects in RUE. PT plans to improve SLS stance and strengthening gluteal muscles in order to improve functional mobility and reduce risk of back pain.  Based on today's session patient will continue to benefit from skilled physical therapy to increase core stabilization and functional strength in order to improve overall QoL and maximize return to PLOF.    OBJECTIVE IMPAIRMENTS: Abnormal gait, decreased activity tolerance, difficulty walking, decreased ROM, decreased strength, and pain.   ACTIVITY LIMITATIONS: carrying, bending, sitting, standing, squatting, and sleeping  PARTICIPATION LIMITATIONS: driving, shopping, and community activity  PERSONAL FACTORS: Age, Social background, and Time since onset of injury/illness/exacerbation are also affecting patient's  functional outcome.   REHAB POTENTIAL: Good  CLINICAL DECISION MAKING: Evolving/moderate complexity  EVALUATION COMPLEXITY: Moderate   GOALS: Goals reviewed with patient? No  SHORT TERM GOALS: Target date: 06/06/2024  Pt will be independent with HEP in order to improve strength and decrease back pain to improve pain-free function at home and work. Baseline: n/a Goal status: INITIAL   LONG TERM GOALS: Target date: 07/18/2024  Pt will be able to sit for 30-60 minutes without reporting discomfort in order to demonstrate significant improvements in household ADLs and return to PLOF.  Baseline: 04/02/2024: Pt reports sitting on the couch > 45 min Goal status: Progressing  2.  Pt will decrease worst back pain by at least 2 points on the NPRS in order to demonstrate clinically significant reduction in back pain. Baseline: 03/12/2024: 10/10 NPS Goal status: INITIAL  3.  Pt will  decrease mODI score by at least 13 points in order demonstrate clinically significant reduction in back pain/disability.     Baseline: 03/18/2024: 31 / 50 = 62.0 % Goal status: INITIAL  4.  Pt will increase L Hip MMT by 1 grade in order to demonstrate clinically significant improvements in LE strength.  Baseline: 03/12/2024 L Hip Flex: 3+/5, Abd: 3+/5 Goal status: INITIAL  5.  Pt will improve 5xSTS to 12 seconds or less to demonstrate clinically significant improvement in LE strength and ability to transfer pain free Baseline: 03/18/2024: 13.47s (7.72.6 sec) Goal status: INITIAL    PLAN: PT FREQUENCY: 1-2x/week  PT DURATION: 12 weeks  PLANNED INTERVENTIONS: Therapeutic exercises, Therapeutic activity, Neuromuscular re-education, Balance training, Gait training, Patient/Family education, Self Care, Joint mobilization, Joint manipulationOrthotic/Fit training, DME instructions, Dry Needling, Electrical stimulation, Spinal manipulation, Spinal mobilization, Cryotherapy, Moist heat, Taping, Traction, Ultrasound, Manual therapy, and Re-evaluation.  PLAN FOR NEXT SESSION: Progress Hip Strength and core stabilization, manual techniques PRN    Satira Curet PT, DPT 04/25/2024, 11:18 AM

## 2024-04-26 ENCOUNTER — Telehealth: Payer: Self-pay

## 2024-04-26 DIAGNOSIS — G8929 Other chronic pain: Secondary | ICD-10-CM

## 2024-04-26 DIAGNOSIS — M5416 Radiculopathy, lumbar region: Secondary | ICD-10-CM

## 2024-04-26 MED ORDER — OXYCODONE-ACETAMINOPHEN 10-325 MG PO TABS: 1.0000 | ORAL_TABLET | ORAL | 0 refills | Status: DC | PRN

## 2024-04-26 NOTE — Addendum Note (Signed)
 Addended by: Raina Bunting on: 04/26/2024 01:19 PM   Modules accepted: Orders

## 2024-04-26 NOTE — Telephone Encounter (Signed)
 Copied from CRM [id number]. Topic: Clinical - Medication Question >> Apr 26, 2024 12:13 PM Lotus Round B wrote: Reason for CRM: pt called in to see if she can get in touch with Dr.K about refilling her oxyCODONE -acetaminophen  (PERCOCET) 10-325 MG tablet medication . If possible Dr.K or nurse can give patient a call to see what she can do about getting it refilled and sent to   [facility] [id number] [place], [place] - [address] [place] [address] Phone: [phone] Fax: [phone] Hours: Not open 24 hours

## 2024-04-30 ENCOUNTER — Other Ambulatory Visit: Payer: Self-pay

## 2024-04-30 ENCOUNTER — Ambulatory Visit

## 2024-04-30 DIAGNOSIS — M5459 Other low back pain: Secondary | ICD-10-CM | POA: Diagnosis not present

## 2024-04-30 DIAGNOSIS — R2689 Other abnormalities of gait and mobility: Secondary | ICD-10-CM

## 2024-04-30 DIAGNOSIS — M6281 Muscle weakness (generalized): Secondary | ICD-10-CM

## 2024-04-30 NOTE — Therapy (Signed)
 OUTPATIENT PHYSICAL THERAPY THORACOLUMBAR TREATMENT/PROGRESS NOTE Dates of reporting period  03/12/2024 to 04/30/2024     Patient Name: Cheryl Payne MRN: [id number] DOB:09-09-1966, 58 y.o., female Today's Date: 04/30/2024  END OF SESSION:  PT End of Session - 04/30/24 0947     Visit Number 10    Number of Visits 25    Date for PT Re-Evaluation 06/04/24    Progress Note Due on Visit 10    PT Start Time 0945    PT Stop Time 1025    PT Time Calculation (min) 40 min    Activity Tolerance Patient tolerated treatment well    Behavior During Therapy [facility] for tasks assessed/performed               Past Medical History:  Diagnosis Date   Anxiety NA   Arthritis    Blood transfusion without reported diagnosis 2023   When i was in the hospital   Cancer [facility])    lung   Chronic left shoulder pain    Depression    GERD (gastroesophageal reflux disease)    Hypertension    Ulcer 2023   When i was in the hospital   Past Surgical History:  Procedure Laterality Date   COLONOSCOPY WITH PROPOFOL  N/A 03/24/2023   Procedure: COLONOSCOPY WITH PROPOFOL ;  Surgeon: Luke Salaam, MD;  Location: [facility] ENDOSCOPY;  Service: Gastroenterology;  Laterality: N/A;   ESOPHAGOGASTRODUODENOSCOPY (EGD) WITH PROPOFOL  N/A 08/17/2022   Procedure: ESOPHAGOGASTRODUODENOSCOPY (EGD) WITH PROPOFOL ;  Surgeon: Luke Salaam, MD;  Location: [facility] ENDOSCOPY;  Service: Gastroenterology;  Laterality: N/A;   ESOPHAGOGASTRODUODENOSCOPY (EGD) WITH PROPOFOL  N/A 03/24/2023   Procedure: ESOPHAGOGASTRODUODENOSCOPY (EGD) WITH PROPOFOL ;  Surgeon: Luke Salaam, MD;  Location: [facility] ENDOSCOPY;  Service: Gastroenterology;  Laterality: N/A;   LUNG SURGERY     Patient Active Problem List   Diagnosis Date Noted   Blood on toilet paper 03/24/2023   Adenomatous polyp of colon 03/24/2023   Gastric ulcer 03/24/2023   GI bleed 08/17/2022   Upper GI bleed 08/16/2022   Leukocytosis 08/16/2022   Chronic left-sided low back pain with  left-sided sciatica 09/21/2021   Pure hypercholesterolemia 07/13/2020   Major depressive disorder, recurrent, in partial remission (HCC) 07/13/2020   GAD (generalized anxiety disorder) 07/13/2020   Encounter for screening mammogram for malignant neoplasm of breast 07/13/2020   Muscle spasm of left shoulder 07/13/2020   Chronic left shoulder pain 07/13/2020   Centrilobular emphysema (HCC) 07/13/2020   Moderate aortic valve insufficiency 07/13/2020   Obesity (BMI 30.0-34.9) 07/13/2020   DOE (dyspnea on exertion) 01/10/2020   Non-small cell carcinoma of lung (HCC) 01/02/2019   Cancer of upper lobe of right lung (HCC) 11/15/2018   Lung mass 10/02/2018   Essential hypertension 09/02/2014    PCP: Raina Bunting, DO  REFERRING PROVIDER: Ludwig Safer, PA-C  REFERRING DIAG:  [phone] (ICD-10-CM) - Lumbar radiculopathy    RATIONALE FOR EVALUATION AND TREATMENT: Rehabilitation  THERAPY DIAG: Other low back pain  Muscle weakness (generalized)  Other abnormalities of gait and mobility  ONSET DATE: Chronic (2019)  FOLLOW-UP APPT SCHEDULED WITH REFERRING PROVIDER: Yes    SUBJECTIVE:  SUBJECTIVE STATEMENT:  Chronic Low Back Pain   PERTINENT HISTORY:   Patient reports that she has had lower back pain since 2019. She reports that she has seen her provider and received multiple steroid injections with little relief.  Patient reports that pain starts in the lower back and refers down the posterior side of the L leg and ends the toes. Numbness and tingling are present in the posterior thigh and plantar aspect of the left foot. No relief from recent prescription (oxycodone , tremedol, hydrocodone ). Patient states that the pain intensifies with prolonged walking, sitting or standing. She reports that she  spends most of the day on the floor or lying down due to the radiating pain. She reports that pain in the L leg that disrupts her sleep throughout the night. Denies Saddle Anaesthesia, loss b/b, fever/chills.   Imaging Per Chart Review (02/26/2024):   EXAM: LUMBAR SPINE - COMPLETE 4+ VIEW   COMPARISON:  MRI lumbar spine January 18, 2024   FINDINGS: Minimal anterior osteophytic changes involving the L3-4 disc space. There is preservation of the height of the lumbar vertebral bodies and intervertebral disc spaces   No spondylolysis or listhesis   IMPRESSION: Minimal degenerative changes  PAIN:    Pain Intensity: Present: 9/10, Best: 5/10, Worst: 10/10 Pain location: lower back, Left posterior thigh and feet  Pain Quality: sharp and stabbing  Radiating: Yes  Numbness/Tingling: Yes Focal Weakness: No Aggravating factors: Prolonged walking, standing, sitting  Relieving factors: Rest, lying on back,  How long can you sit: < 10 min  How long can you stand: < 10 min  History of prior back injury, pain, surgery, or therapy: Yes Dominant hand: right  PRECAUTIONS: Fall  WEIGHT BEARING RESTRICTIONS: No  FALLS: Has patient fallen in last 6 months? No  Living Environment Lives with: Alone  Lives in: House/apartment Stairs: No Has following equipment at home: None  Prior level of function: Independent  Occupational demands: Retired   Presenter, broadcasting: Engineer, water, Cooking   Patient Goals: Relieve Pain and return PLOF    OBJECTIVE:  Patient Surveys    Cognition Patient is oriented to person, place, and time.  Attention span and concentration are intact.  Expressive speech is intact.     Gross Musculoskeletal Assessment Tremor: None Bulk: Normal Tone: Normal No visible step-off along spinal column, no signs of scoliosis  GAIT: Distance walked: 60m Assistive device utilized: None Level of assistance: Complete Independence Comments: Antalgic, Narrow Step Width,  bilateral knee valgus present  Posture: Lumbar lordosis: WNL Iliac crest height: Equal bilaterally Lumbar lateral shift: Negative  AROM AROM (Normal range in degrees) AROM   Lumbar   Flexion (65) 85% limited due to hamstring tightness  Extension (30) 100%  Right lateral flexion (25) 100%  Left lateral flexion (25) 100%  Right rotation (30) 100%  Left rotation (30) 100%      Hip Right Left  Flexion (125) WNL WNL  Extension (15)    Abduction (40)    Adduction     Internal Rotation (45) 30 30  External Rotation (45) 30 20*      Knee    Flexion (135) WNL WNL  Extension (0) WNL WNL      Ankle    Dorsiflexion (20)    Plantarflexion (50)    Inversion (35)    Eversion (15)    (* = pain; Blank rows = not tested)  LE MMT: MMT (out of 5) Right  Left   Hip flexion 3+ 3+  Hip extension    Hip abduction 4 3+  Hip adduction    Hip internal rotation    Hip external rotation    Knee flexion 4+ 4-  Knee extension 4+ 4-  Ankle dorsiflexion    Ankle plantarflexion    Ankle inversion    Ankle eversion    (* = pain; Blank rows = not tested)  Sensation Grossly intact to light touch throughout bilateral LEs as determined by testing dermatomes.   Intermittent sharp pain noted in R LE  in L5-S1 dermatomal pattern.  Reflexes Not Test  Muscle Length Hamstrings: R: Negative 20 L: Positive: 40 with reproduction of pain  Palpation Location Right Left         Lumbar paraspinals 0 0  Quadratus Lumborum 0 0  Gluteus Maximus 1 1  Gluteus Medius 1 1  Deep hip external rotators 1 2  PSIS    Fortin's Area (SIJ)    Greater Trochanter 1 2  (Blank rows = not tested) Graded on 0-4 scale (0 = no pain, 1 = pain, 2 = pain with wincing/grimacing/flinching, 3 = pain with withdrawal, 4 = unwilling to allow palpation)  Passive Accessory Intervertebral Motion Pt reports reproduction of back pain with CPA L4-S1 segments with no improvements following prolonged CPA.   Special  Tests Lumbar Radiculopathy and Discogenic: Centralization and Peripheralization (SN 92, -LR 0.12): Not Tested  Slump (SN 83, -LR 0.32): Not Tested SLR (SN 92, -LR 0.29): Not Tested  Hip: FABER (SN 81): R: Negative L: Negative FADIR (SN 94): R: Negative L: Negative  Functional Tasks  Deep squat: Narrow BOS, Hip hinge absent, decreased ankle dorsiflexion   Sit to stand: Use of BUE support, minor knee valgus present  Forward Step-Down Test: R: Pain with Knee Valgus present   TODAY'S TREATMENT: DATE: 04/30/2024   Subjective: Pt reports 0/10 pain in the lower back but has 3-4/10 in the L posterior thigh. Pt continues to do well without back pain. No question or concern    Physical Performance Measures (2 min unbilled)  MMT:    (L Sidelying) R Hip Abduction/Flexion:  4-/5, 4-/5   5TSTS: 9.17s  Therapeutic Exercise:   NuStep L3-1 x 5 min x LE (Seat 10) for LE warm up, endurance and strength; PT manually adjusted resistance throughout bout.     Matrix Hip Abduction   R/L: 1 x 10 25#      2 x 10 30#    Sidelying Clamshell against resistance    R: 3 x 10, Blue TB    Sidelying Hip Abduction against PT resistance   R: 2 x 10, light PT resistance at distal leg     There.act:    Partial Squats with Blue TB around knees improve squat mechanics for lifting heavier objects    3 x 10, 3 Kg med ball in BUE support    Lateral Stepping against resistance   Green TB around ankle 1 x 24'    Green Tb around Feet 1 x 12'    TRX Partial Squats    2 x 8    Multimodal cues for wider BoS.   PATIENT EDUCATION:  Education details: HEP, Exercises  Person educated: Patient Education method: Explanation, Actor cues, Verbal cues, and Handouts Education comprehension: verbalized understanding, returned demonstration, and needs further education   HOME EXERCISE PROGRAM:  Access Code: [email or web address] URL: [email or web address] Date: 04/23/2024 Prepared by: Satira Curet  Exercises - Supine Figure 4 Piriformis Stretch  - 1 x daily -  7 x weekly - 3 sets - 30-60 hold - Supine Single Knee to Chest Stretch  - 1 x daily - 7 x weekly - 3 sets - 30-60 hold - Seated Hamstring Stretch  - 1 x daily - 7 x weekly - 3 sets - 30-60 hold - Supine Sciatic Nerve Glide  - 1 x daily - 7 x weekly - 1 sets - 10-12 reps - Bridge with Hip Abduction and Resistance  - 1 x daily - 3-4 x weekly - 2-3 sets - 10-12 reps - Clam with Resistance  - 1 x daily - 3-4 x weekly - 2-3 sets - 10-12 reps - Bird Dog  - 1 x daily - 3-4 x weekly - 2-3 sets - 10-12 reps - Supine Dead Bug with Leg Extension  - 1 x daily - 3-4 x weekly - 2-3 sets - 10-12 reps - Side Plank on Knees  - 1 x daily - 3-4 x weekly - 2 sets - 3 reps - 10s hold  Access Code: [email or web address] URL: [email or web address] Date: 04/08/2024 Prepared by: Veryl Gottron Prisca Gearing  Exercises - Supine Figure 4 Piriformis Stretch  - 1 x daily - 7 x weekly - 3 sets - 30-60 hold - Supine Single Knee to Chest Stretch  - 1 x daily - 7 x weekly - 3 sets - 30-60 hold - Seated Hamstring Stretch  - 1 x daily - 7 x weekly - 3 sets - 30-60 hold - Bridge with Hip Abduction and Resistance  - 1 x daily - 3-4 x weekly - 2-3 sets - 10-12 reps - Clam with Resistance  - 1 x daily - 3-4 x weekly - 2-3 sets - 10-12 reps - Bird Dog  - 1 x daily - 3-4 x weekly - 2-3 sets - 10-12 reps - Supine Dead Bug with Leg Extension  - 1 x daily - 3-4 x weekly - 2-3 sets - 10-12 reps - Side Plank on Knees  - 1 x daily - 3-4 x weekly - 2 sets - 3 reps - 10s hold   ASSESSMENT:  CLINICAL IMPRESSION: Continued PT POC with a focus on reassessment towards PT and personal goals. Patient has demonstrated improvements in LE strength and pain as indicated with NPS goal and 5TSTS (see below in goals). Additionally the patient endorsed improvements with household ADLs and ability to sit longer periods without pain in her back. Today she self reported a lower mODI score (see  below) indicating improvements in functional and recreational activities. Although her pain rating has significantly improved since her initial visit; she still reports the worst pain in her L gluteal region with intermittent referral pain in posterior thigh. Currently her thigh pain continues to limit longer walking distances, restful sleep duration and recreational activities. Remainder of the session with focus on improving functional and L gluteal strengthening. PT plans to continue functional and gluteal strength training in order to lessen radicular/sciatic symptoms. Based on today's session patient will continue to benefit from skilled physical therapy to increase core stabilization and functional strength in order to improve overall QoL and maximize return to PLOF.   OBJECTIVE IMPAIRMENTS: Abnormal gait, decreased activity tolerance, difficulty walking, decreased ROM, decreased strength, and pain.   ACTIVITY LIMITATIONS: carrying, bending, sitting, standing, squatting, and sleeping  PARTICIPATION LIMITATIONS: driving, shopping, and community activity  PERSONAL FACTORS: Age, Social background, and Time since onset of injury/illness/exacerbation are also affecting patient's functional outcome.   REHAB POTENTIAL: Good  CLINICAL DECISION MAKING: Evolving/moderate complexity  EVALUATION COMPLEXITY: Moderate   GOALS: Goals reviewed with patient? No  SHORT TERM GOALS: Target date: 06/11/2024  Pt will be independent with HEP in order to improve strength and decrease back pain to improve pain-free function at home and work. Baseline: Pt endorses using Mychart and HEP  exercises 2-3x/wk  Goal status: MET   LONG TERM GOALS: Target date: 07/23/2024  Pt will be able to sit for 30-60 minutes without reporting discomfort in order to demonstrate significant improvements in household ADLs and return to PLOF.  Baseline: 04/02/2024: Pt reports sitting on the couch > 45 min; 04/30/2024: Pt reports  able to sitting on couch > 45 min.  Goal status: GOAL MET   2.  Pt will decrease worst back pain by at least 2 points on the NPRS in order to demonstrate clinically significant reduction in back pain. Baseline: 03/12/2024: 10/10 NPS; 04/30/2024: 5/10 NPS  Goal status: GOAL MET  3.  Pt will decrease mODI score by at least 13 points in order demonstrate clinically significant reduction in back pain/disability.     Baseline: 03/18/2024: 31 / 50 = 62.0 %; 04/30/2024: 12 / 50 = 24.0 % Goal status: Progressing   4.  Pt will increase L Hip MMT by 1 grade in order to demonstrate clinically significant improvements in LE strength.  Baseline: 03/12/2024 L Hip Flex: 3+/5, Abd: 3+/5; 04/30/2024: Hip Flex 4-/5, Abd 4-/5 Goal status: Progressing  5.  Pt will improve 5xSTS to 12 seconds or less to demonstrate clinically significant improvement in LE strength and ability to transfer pain free Baseline: 03/18/2024: 13.47s (7.72.6 sec); 04/30/2024: 9.17s Goal status: GOAL MET   PLAN: PT FREQUENCY: 1-2x/week  PT DURATION: 12 weeks  PLANNED INTERVENTIONS: Therapeutic exercises, Therapeutic activity, Neuromuscular re-education, Balance training, Gait training, Patient/Family education, Self Care, Joint mobilization, Joint manipulationOrthotic/Fit training, DME instructions, Dry Needling, Electrical stimulation, Spinal manipulation, Spinal mobilization, Cryotherapy, Moist heat, Taping, Traction, Ultrasound, Manual therapy, and Re-evaluation.  PLAN FOR NEXT SESSION: Progress Farmers carry, L gluteal strengthening, hamstring lengthening, sciatic nerve glides Progress Hip Strength and core stabilization, manual techniques PRN    Satira Curet PT, DPT 04/30/2024, 12:26 PM

## 2024-05-01 ENCOUNTER — Encounter

## 2024-05-02 ENCOUNTER — Ambulatory Visit

## 2024-05-02 DIAGNOSIS — R2689 Other abnormalities of gait and mobility: Secondary | ICD-10-CM

## 2024-05-02 DIAGNOSIS — M5459 Other low back pain: Secondary | ICD-10-CM

## 2024-05-02 DIAGNOSIS — M6281 Muscle weakness (generalized): Secondary | ICD-10-CM

## 2024-05-02 NOTE — Patient Instructions (Signed)
 Thank you for allowing the Complex Care Management team to participate in your care. It was great speaking with you today!  We will follow up on May 28, 2024 at 2 pm. Please do not hesitate to contact me if you require assistance prior to our next outreach.    Roxie Cord [facility] Population Health RN Care Manager Direct Dial: [phone]  Fax: [phone] Website: [email or web address]

## 2024-05-02 NOTE — Therapy (Signed)
 OUTPATIENT PHYSICAL THERAPY THORACOLUMBAR TREATMENT     Patient Name: Cheryl Payne MRN: [id number] DOB:September 21, 1966, 58 y.o., female Today's Date: 05/02/2024  END OF SESSION:  PT End of Session - 05/02/24 0945     Visit Number 11    Number of Visits 25    Date for PT Re-Evaluation 06/04/24    Progress Note Due on Visit 10    PT Start Time 0945    PT Stop Time 1025    PT Time Calculation (min) 40 min    Activity Tolerance Patient tolerated treatment well    Behavior During Therapy [facility] for tasks assessed/performed               Past Medical History:  Diagnosis Date   Anxiety NA   Arthritis    Blood transfusion without reported diagnosis 2023   When i was in the hospital   Cancer [facility])    lung   Chronic left shoulder pain    Depression    GERD (gastroesophageal reflux disease)    Hypertension    Ulcer 2023   When i was in the hospital   Past Surgical History:  Procedure Laterality Date   COLONOSCOPY WITH PROPOFOL  N/A 03/24/2023   Procedure: COLONOSCOPY WITH PROPOFOL ;  Surgeon: Luke Salaam, MD;  Location: [facility] ENDOSCOPY;  Service: Gastroenterology;  Laterality: N/A;   ESOPHAGOGASTRODUODENOSCOPY (EGD) WITH PROPOFOL  N/A 08/17/2022   Procedure: ESOPHAGOGASTRODUODENOSCOPY (EGD) WITH PROPOFOL ;  Surgeon: Luke Salaam, MD;  Location: [facility] ENDOSCOPY;  Service: Gastroenterology;  Laterality: N/A;   ESOPHAGOGASTRODUODENOSCOPY (EGD) WITH PROPOFOL  N/A 03/24/2023   Procedure: ESOPHAGOGASTRODUODENOSCOPY (EGD) WITH PROPOFOL ;  Surgeon: Luke Salaam, MD;  Location: [facility] ENDOSCOPY;  Service: Gastroenterology;  Laterality: N/A;   LUNG SURGERY     Patient Active Problem List   Diagnosis Date Noted   Blood on toilet paper 03/24/2023   Adenomatous polyp of colon 03/24/2023   Gastric ulcer 03/24/2023   GI bleed 08/17/2022   Upper GI bleed 08/16/2022   Leukocytosis 08/16/2022   Chronic left-sided low back pain with left-sided sciatica 09/21/2021   Pure hypercholesterolemia  07/13/2020   Major depressive disorder, recurrent, in partial remission (HCC) 07/13/2020   GAD (generalized anxiety disorder) 07/13/2020   Encounter for screening mammogram for malignant neoplasm of breast 07/13/2020   Muscle spasm of left shoulder 07/13/2020   Chronic left shoulder pain 07/13/2020   Centrilobular emphysema (HCC) 07/13/2020   Moderate aortic valve insufficiency 07/13/2020   Obesity (BMI 30.0-34.9) 07/13/2020   DOE (dyspnea on exertion) 01/10/2020   Non-small cell carcinoma of lung (HCC) 01/02/2019   Cancer of upper lobe of right lung (HCC) 11/15/2018   Lung mass 10/02/2018   Essential hypertension 09/02/2014    PCP: Raina Bunting, DO  REFERRING PROVIDER: Ludwig Safer, PA-C  REFERRING DIAG:  [phone] (ICD-10-CM) - Lumbar radiculopathy    RATIONALE FOR EVALUATION AND TREATMENT: Rehabilitation  THERAPY DIAG: Other low back pain  Muscle weakness (generalized)  Other abnormalities of gait and mobility  ONSET DATE: Chronic (2019)  FOLLOW-UP APPT SCHEDULED WITH REFERRING PROVIDER: Yes    SUBJECTIVE:  SUBJECTIVE STATEMENT:  Chronic Low Back Pain   PERTINENT HISTORY:   Patient reports that she has had lower back pain since 2019. She reports that she has seen her provider and received multiple steroid injections with little relief.  Patient reports that pain starts in the lower back and refers down the posterior side of the L leg and ends the toes. Numbness and tingling are present in the posterior thigh and plantar aspect of the left foot. No relief from recent prescription (oxycodone , tremedol, hydrocodone ). Patient states that the pain intensifies with prolonged walking, sitting or standing. She reports that she spends most of the day on the floor or lying down due to the  radiating pain. She reports that pain in the L leg that disrupts her sleep throughout the night. Denies Saddle Anaesthesia, loss b/b, fever/chills.   Imaging Per Chart Review (02/26/2024):   EXAM: LUMBAR SPINE - COMPLETE 4+ VIEW   COMPARISON:  MRI lumbar spine January 18, 2024   FINDINGS: Minimal anterior osteophytic changes involving the L3-4 disc space. There is preservation of the height of the lumbar vertebral bodies and intervertebral disc spaces   No spondylolysis or listhesis   IMPRESSION: Minimal degenerative changes  PAIN:    Pain Intensity: Present: 9/10, Best: 5/10, Worst: 10/10 Pain location: lower back, Left posterior thigh and feet  Pain Quality: sharp and stabbing  Radiating: Yes  Numbness/Tingling: Yes Focal Weakness: No Aggravating factors: Prolonged walking, standing, sitting  Relieving factors: Rest, lying on back,  How long can you sit: < 10 min  How long can you stand: < 10 min  History of prior back injury, pain, surgery, or therapy: Yes Dominant hand: right  PRECAUTIONS: Fall  WEIGHT BEARING RESTRICTIONS: No  FALLS: Has patient fallen in last 6 months? No  Living Environment Lives with: Alone  Lives in: House/apartment Stairs: No Has following equipment at home: None  Prior level of function: Independent  Occupational demands: Retired   Presenter, broadcasting: Engineer, water, Cooking   Patient Goals: Relieve Pain and return PLOF    OBJECTIVE:  Patient Surveys    Cognition Patient is oriented to person, place, and time.  Attention span and concentration are intact.  Expressive speech is intact.     Gross Musculoskeletal Assessment Tremor: None Bulk: Normal Tone: Normal No visible step-off along spinal column, no signs of scoliosis  GAIT: Distance walked: 79m Assistive device utilized: None Level of assistance: Complete Independence Comments: Antalgic, Narrow Step Width, bilateral knee valgus present  Posture: Lumbar lordosis:  WNL Iliac crest height: Equal bilaterally Lumbar lateral shift: Negative  AROM AROM (Normal range in degrees) AROM   Lumbar   Flexion (65) 85% limited due to hamstring tightness  Extension (30) 100%  Right lateral flexion (25) 100%  Left lateral flexion (25) 100%  Right rotation (30) 100%  Left rotation (30) 100%      Hip Right Left  Flexion (125) WNL WNL  Extension (15)    Abduction (40)    Adduction     Internal Rotation (45) 30 30  External Rotation (45) 30 20*      Knee    Flexion (135) WNL WNL  Extension (0) WNL WNL      Ankle    Dorsiflexion (20)    Plantarflexion (50)    Inversion (35)    Eversion (15)    (* = pain; Blank rows = not tested)  LE MMT: MMT (out of 5) Right  Left   Hip flexion 3+ 3+  Hip extension    Hip abduction 4 3+  Hip adduction    Hip internal rotation    Hip external rotation    Knee flexion 4+ 4-  Knee extension 4+ 4-  Ankle dorsiflexion    Ankle plantarflexion    Ankle inversion    Ankle eversion    (* = pain; Blank rows = not tested)  Sensation Grossly intact to light touch throughout bilateral LEs as determined by testing dermatomes.   Intermittent sharp pain noted in R LE  in L5-S1 dermatomal pattern.  Reflexes Not Test  Muscle Length Hamstrings: R: Negative 20 L: Positive: 40 with reproduction of pain  Palpation Location Right Left         Lumbar paraspinals 0 0  Quadratus Lumborum 0 0  Gluteus Maximus 1 1  Gluteus Medius 1 1  Deep hip external rotators 1 2  PSIS    Fortin's Area (SIJ)    Greater Trochanter 1 2  (Blank rows = not tested) Graded on 0-4 scale (0 = no pain, 1 = pain, 2 = pain with wincing/grimacing/flinching, 3 = pain with withdrawal, 4 = unwilling to allow palpation)  Passive Accessory Intervertebral Motion Pt reports reproduction of back pain with CPA L4-S1 segments with no improvements following prolonged CPA.   Special Tests Lumbar Radiculopathy and Discogenic: Centralization and  Peripheralization (SN 92, -LR 0.12): Not Tested  Slump (SN 83, -LR 0.32): Not Tested SLR (SN 92, -LR 0.29): Not Tested  Hip: FABER (SN 81): R: Negative L: Negative FADIR (SN 94): R: Negative L: Negative  Functional Tasks  Deep squat: Narrow BOS, Hip hinge absent, decreased ankle dorsiflexion   Sit to stand: Use of BUE support, minor knee valgus present  Forward Step-Down Test: R: Pain with Knee Valgus present   TODAY'S TREATMENT: DATE: 05/02/2024   Subjective: Pt reports 0/10 pain in the lower back but has 4/10 in the L posterior thigh since the last visit. Pt will f/u with [facility] regarding steroidal shot for her hip. Pt continues to do well without back pain. No question or concern  Therapeutic Exercise:   NuStep L5-1 x 5 min xUE/LE x > 80 spm (Seat 10) for LE warm up, endurance and strength; PT manually adjusted resistance throughout bout.     Hip Matrix Hip Abduction    R/L: 2 x 12 25#   Hip Extension    R/L: 2 x 12 25#   Sidelying Hip Abduction against PT resistance   R: 3 x 10, light PT resistance at distal leg     Supine Bridge   1 x 10    2 x 10 with hip abduction, Blue TB around knees     Hooklying marches against resistance 1 x 10 Blue TB aorund knee 1 x 12 "" with green TB with antirotation  Active Lumbar Rotation Stretch with PT OP at end range  30s/bout x 3 in order to improve ROM and tissue extensibility    There.act:    Kettlebell Dead lift   2 x 10 20# KB    Lateral Step Down with Hip Abduction    R/L: 2 x 10 Red TB around thigh      PATIENT EDUCATION:  Education details: HEP, Exercises  Person educated: Patient Education method: Explanation, Actor cues, Verbal cues, and Handouts Education comprehension: verbalized understanding, returned demonstration, and needs further education   HOME EXERCISE PROGRAM:  Access Code: [email or web address] URL: [email or web address] Date: 04/23/2024 Prepared by: Satira Curet  Exercises -  Supine Figure 4 Piriformis Stretch  - 1 x daily - 7 x weekly - 3 sets - 30-60 hold - Supine Single Knee to Chest Stretch  - 1 x daily - 7 x weekly - 3 sets - 30-60 hold - Seated Hamstring Stretch  - 1 x daily - 7 x weekly - 3 sets - 30-60 hold - Supine Sciatic Nerve Glide  - 1 x daily - 7 x weekly - 1 sets - 10-12 reps - Bridge with Hip Abduction and Resistance  - 1 x daily - 3-4 x weekly - 2-3 sets - 10-12 reps - Clam with Resistance  - 1 x daily - 3-4 x weekly - 2-3 sets - 10-12 reps - Bird Dog  - 1 x daily - 3-4 x weekly - 2-3 sets - 10-12 reps - Supine Dead Bug with Leg Extension  - 1 x daily - 3-4 x weekly - 2-3 sets - 10-12 reps - Side Plank on Knees  - 1 x daily - 3-4 x weekly - 2 sets - 3 reps - 10s hold  Access Code: [email or web address] URL: [email or web address] Date: 04/08/2024 Prepared by: Veryl Gottron Patricie Geeslin  Exercises - Supine Figure 4 Piriformis Stretch  - 1 x daily - 7 x weekly - 3 sets - 30-60 hold - Supine Single Knee to Chest Stretch  - 1 x daily - 7 x weekly - 3 sets - 30-60 hold - Seated Hamstring Stretch  - 1 x daily - 7 x weekly - 3 sets - 30-60 hold - Bridge with Hip Abduction and Resistance  - 1 x daily - 3-4 x weekly - 2-3 sets - 10-12 reps - Clam with Resistance  - 1 x daily - 3-4 x weekly - 2-3 sets - 10-12 reps - Bird Dog  - 1 x daily - 3-4 x weekly - 2-3 sets - 10-12 reps - Supine Dead Bug with Leg Extension  - 1 x daily - 3-4 x weekly - 2-3 sets - 10-12 reps - Side Plank on Knees  - 1 x daily - 3-4 x weekly - 2 sets - 3 reps - 10s hold   ASSESSMENT:  CLINICAL IMPRESSION: Continued PT POC with a main focus on improving lower back pain, functional strength, thigh pain. Pt tolerated increase in repetitions with resistive exercises without report of additional pain. PT focused on continued strengthening of gluteal muscles. Good form with kettlebell deadlift but multi modal cues needed for TrA activation and hip hinge. Based on today's performance, pt will  continue to benefit from skilled PT in order to facilitate return to PLOF and improve QoL.   OBJECTIVE IMPAIRMENTS: Abnormal gait, decreased activity tolerance, difficulty walking, decreased ROM, decreased strength, and pain.   ACTIVITY LIMITATIONS: carrying, bending, sitting, standing, squatting, and sleeping  PARTICIPATION LIMITATIONS: driving, shopping, and community activity  PERSONAL FACTORS: Age, Social background, and Time since onset of injury/illness/exacerbation are also affecting patient's functional outcome.   REHAB POTENTIAL: Good  CLINICAL DECISION MAKING: Evolving/moderate complexity  EVALUATION COMPLEXITY: Moderate   GOALS: Goals reviewed with patient? No  SHORT TERM GOALS: Target date: 06/13/2024  Pt will be independent with HEP in order to improve strength and decrease back pain to improve pain-free function at home and work. Baseline: Pt endorses using Mychart and HEP  exercises 2-3x/wk  Goal status: MET   LONG TERM GOALS: Target date: 07/25/2024  Pt will be able to sit for 30-60 minutes without reporting discomfort in order to demonstrate significant improvements in household ADLs and  return to PLOF.  Baseline: 04/02/2024: Pt reports sitting on the couch > 45 min; 04/30/2024: Pt reports able to sitting on couch > 45 min.  Goal status: GOAL MET   2.  Pt will decrease worst back pain by at least 2 points on the NPRS in order to demonstrate clinically significant reduction in back pain. Baseline: 03/12/2024: 10/10 NPS; 04/30/2024: 5/10 NPS  Goal status: GOAL MET  3.  Pt will decrease mODI score by at least 13 points in order demonstrate clinically significant reduction in back pain/disability.     Baseline: 03/18/2024: 31 / 50 = 62.0 %; 04/30/2024: 12 / 50 = 24.0 % Goal status: Progressing   4.  Pt will increase L Hip MMT by 1 grade in order to demonstrate clinically significant improvements in LE strength.  Baseline: 03/12/2024 L Hip Flex: 3+/5, Abd: 3+/5;  04/30/2024: Hip Flex 4-/5, Abd 4-/5 Goal status: Progressing  5.  Pt will improve 5xSTS to 12 seconds or less to demonstrate clinically significant improvement in LE strength and ability to transfer pain free Baseline: 03/18/2024: 13.47s (7.72.6 sec); 04/30/2024: 9.17s Goal status: GOAL MET   PLAN: PT FREQUENCY: 1-2x/week  PT DURATION: 12 weeks  PLANNED INTERVENTIONS: Therapeutic exercises, Therapeutic activity, Neuromuscular re-education, Balance training, Gait training, Patient/Family education, Self Care, Joint mobilization, Joint manipulationOrthotic/Fit training, DME instructions, Dry Needling, Electrical stimulation, Spinal manipulation, Spinal mobilization, Cryotherapy, Moist heat, Taping, Traction, Ultrasound, Manual therapy, and Re-evaluation.  PLAN FOR NEXT SESSION: Progress Farmers carry, L gluteal strengthening, hamstring lengthening, sciatic nerve glides Progress Hip Strength and core stabilization, manual techniques PRN    Satira Curet PT, DPT 05/02/2024, 9:48 AM

## 2024-05-02 NOTE — Patient Outreach (Signed)
 Complex Care Management   Visit Note    Name:  Cheryl Payne MRN: [id number] DOB: 06/06/1966  Situation: Referral received for Complex Care Management related to HTN and History of Lung Disease. I obtained verbal consent from Patient.  Visit completed with Ms. Ellington via telephone.  Background:   Past Medical History:  Diagnosis Date   Anxiety NA   Arthritis    Blood transfusion without reported diagnosis 2023   When i was in the hospital   Cancer [facility])    lung   Chronic left shoulder pain    Depression    GERD (gastroesophageal reflux disease)    Hypertension    Ulcer 2023   When i was in the hospital    Assessment: Patient Reported Symptoms: Cognitive Cognitive Status: Alert and oriented to person, place, and time, Normal speech and language skills Cognitive/Intellectual Conditions Management [RPT]: None reported or documented in medical history or problem list Health Maintenance Behaviors: Annual physical exam, Social activities, Stress management Healing Pattern: Average Health Facilitated by: Rest, Stress management  Neurological Neurological Review of Symptoms: No symptoms reported  HEENT HEENT Symptoms Reported: No symptoms reported  Cardiovascular Cardiovascular Symptoms Reported: No symptoms reported Does patient have uncontrolled Hypertension?: No  Respiratory Respiratory Symptoms Reported: No symptoms reported  Endocrine Patient reports the following symptoms related to hypoglycemia or hyperglycemia : No symptoms reported Is patient diabetic?: No  Gastrointestinal Gastrointestinal Symptoms Reported: No symptoms reported  Genitourinary Genitourinary Symptoms Reported: No symptoms reported  Integumentary Integumentary Symptoms Reported: No symptoms reported  Musculoskeletal Musculoskelatal Symptoms Reviewed: Other Other Musculoskeletal Symptoms: Currently completing Physical Therapy twice a week for strengthening  Psychosocial Psychosocial Symptoms Reported: No  symptoms reported Quality of Family Relationships: supportive Do you feel physically threatened by others?: No      04/30/2024    4:15 PM  Depression screen PHQ 2/9  Decreased Interest 0  Down, Depressed, Hopeless 0  PHQ - 2 Score 0    Vitals:    Medications Reviewed Today     Reviewed by Roxie Cord, RN (Registered Nurse) on 04/30/24 at 1606  Med List Status: <None>   Medication Order Taking? Sig Documenting Provider Last Dose Status Informant  amLODipine  (NORVASC ) 5 MG tablet [id number]  Take 1 tablet (5 mg total) by mouth daily. Raina Bunting, DO  Active   busPIRone  (BUSPAR ) 5 MG tablet [id number]  TAKE 1 TABLET BY MOUTH 3 TIMES A DAY AS NEEDED FOR ANXIETY Raina Bunting, DO  Active   escitalopram  (LEXAPRO ) 20 MG tablet [id number]  TAKE 1 TABLET BY MOUTH AT BEDTIME Karamalegos, Kayleen Party, DO  Active   fluticasone (FLONASE) 50 MCG/ACT nasal spray [id number]  Place 1 spray into both nostrils daily. [provider]  Active Self  gabapentin  (NEURONTIN ) 300 MG capsule [id number]  Take 1 cap (300mg ) in morning and take 2 caps (600mg ) at bedtime. Max dose is 3 capsules per day. Raina Bunting, DO  Active   loratadine ([employer] ALLERGY RELIEF) 10 MG tablet [id number]  Take 10 mg by mouth daily. [provider]  Active   oxyCODONE -acetaminophen  (PERCOCET) 10-325 MG tablet [id number] No Take 1 tablet by mouth every 4 (four) hours as needed for pain.  Patient not taking: Reported on 04/30/2024   Raina Bunting, DO Not Taking Active   predniSONE  (STERAPRED UNI-PAK 21 TAB) 10 MG (21) TBPK tablet [id number] No Take 6 tablets on the first day and decrease by 1 tablet each day until finished.  Patient not taking:  Reported on 02/26/2024   Sherryle Don, FNP Not Taking Active   rosuvastatin  (CRESTOR ) 5 MG tablet [id number]  Take 1 tablet (5 mg total) by mouth every other day. Raina Bunting, DO  Active   tiZANidine  (ZANAFLEX ) 4 MG tablet  [id number]  Take 1 tablet (4 mg total) by mouth 3 (three) times daily. Chrystie Crass B, FNP  Active   triamcinolone  ointment (KENALOG ) 0.1 % [id number]  Apply 1 Application topically 2 (two) times daily. Raina Bunting, DO  Active             Recommendation:   Continue Current Plan of Care  Follow Up Plan:   Telephone follow up appointment with Nurse  Case Manager on May 28, 2024.   Roxie Cord [facility] Population [facility] RN Care Manager Direct Dial: [phone]  Fax: [phone] Website: [email or web address]

## 2024-05-06 ENCOUNTER — Ambulatory Visit: Attending: Physician Assistant

## 2024-05-06 DIAGNOSIS — M6281 Muscle weakness (generalized): Secondary | ICD-10-CM | POA: Diagnosis not present

## 2024-05-06 DIAGNOSIS — M5459 Other low back pain: Secondary | ICD-10-CM | POA: Insufficient documentation

## 2024-05-06 DIAGNOSIS — R2689 Other abnormalities of gait and mobility: Secondary | ICD-10-CM | POA: Diagnosis not present

## 2024-05-06 NOTE — Therapy (Signed)
 OUTPATIENT PHYSICAL THERAPY THORACOLUMBAR TREATMENT     Patient Name: Cheryl Payne MRN: [id number] DOB:03/20/1966, 58 y.o., female Today's Date: 05/06/2024  END OF SESSION:  PT End of Session - 05/06/24 0947     Visit Number 12    Number of Visits 25    Date for PT Re-Evaluation 06/04/24    Progress Note Due on Visit 10    PT Start Time 0945    PT Stop Time 1025    PT Time Calculation (min) 40 min    Activity Tolerance Patient tolerated treatment well    Behavior During Therapy [facility] for tasks assessed/performed               Past Medical History:  Diagnosis Date   Anxiety NA   Arthritis    Blood transfusion without reported diagnosis 2023   When i was in the hospital   Cancer [facility])    lung   Chronic left shoulder pain    Depression    GERD (gastroesophageal reflux disease)    Hypertension    Ulcer 2023   When i was in the hospital   Past Surgical History:  Procedure Laterality Date   COLONOSCOPY WITH PROPOFOL  N/A 03/24/2023   Procedure: COLONOSCOPY WITH PROPOFOL ;  Surgeon: Luke Salaam, MD;  Location: [facility] ENDOSCOPY;  Service: Gastroenterology;  Laterality: N/A;   ESOPHAGOGASTRODUODENOSCOPY (EGD) WITH PROPOFOL  N/A 08/17/2022   Procedure: ESOPHAGOGASTRODUODENOSCOPY (EGD) WITH PROPOFOL ;  Surgeon: Luke Salaam, MD;  Location: [facility] ENDOSCOPY;  Service: Gastroenterology;  Laterality: N/A;   ESOPHAGOGASTRODUODENOSCOPY (EGD) WITH PROPOFOL  N/A 03/24/2023   Procedure: ESOPHAGOGASTRODUODENOSCOPY (EGD) WITH PROPOFOL ;  Surgeon: Luke Salaam, MD;  Location: [facility] ENDOSCOPY;  Service: Gastroenterology;  Laterality: N/A;   LUNG SURGERY     Patient Active Problem List   Diagnosis Date Noted   Blood on toilet paper 03/24/2023   Adenomatous polyp of colon 03/24/2023   Gastric ulcer 03/24/2023   GI bleed 08/17/2022   Upper GI bleed 08/16/2022   Leukocytosis 08/16/2022   Chronic left-sided low back pain with left-sided sciatica 09/21/2021   Pure hypercholesterolemia 07/13/2020    Major depressive disorder, recurrent, in partial remission (HCC) 07/13/2020   GAD (generalized anxiety disorder) 07/13/2020   Encounter for screening mammogram for malignant neoplasm of breast 07/13/2020   Muscle spasm of left shoulder 07/13/2020   Chronic left shoulder pain 07/13/2020   Centrilobular emphysema (HCC) 07/13/2020   Moderate aortic valve insufficiency 07/13/2020   Obesity (BMI 30.0-34.9) 07/13/2020   DOE (dyspnea on exertion) 01/10/2020   Non-small cell carcinoma of lung (HCC) 01/02/2019   Cancer of upper lobe of right lung (HCC) 11/15/2018   Lung mass 10/02/2018   Essential hypertension 09/02/2014    PCP: Raina Bunting, DO  REFERRING PROVIDER: Ludwig Safer, PA-C  REFERRING DIAG:  [phone] (ICD-10-CM) - Lumbar radiculopathy    RATIONALE FOR EVALUATION AND TREATMENT: Rehabilitation  THERAPY DIAG: Other low back pain  Muscle weakness (generalized)  Other abnormalities of gait and mobility  ONSET DATE: Chronic (2019)  FOLLOW-UP APPT SCHEDULED WITH REFERRING PROVIDER: Yes    SUBJECTIVE:  SUBJECTIVE STATEMENT:  Chronic Low Back Pain   PERTINENT HISTORY:   Patient reports that she has had lower back pain since 2019. She reports that she has seen her provider and received multiple steroid injections with little relief.  Patient reports that pain starts in the lower back and refers down the posterior side of the L leg and ends the toes. Numbness and tingling are present in the posterior thigh and plantar aspect of the left foot. No relief from recent prescription (oxycodone , tremedol, hydrocodone ). Patient states that the pain intensifies with prolonged walking, sitting or standing. She reports that she spends most of the day on the floor or lying down due to the radiating  pain. She reports that pain in the L leg that disrupts her sleep throughout the night. Denies Saddle Anaesthesia, loss b/b, fever/chills.   Imaging Per Chart Review (02/26/2024):   EXAM: LUMBAR SPINE - COMPLETE 4+ VIEW   COMPARISON:  MRI lumbar spine January 18, 2024   FINDINGS: Minimal anterior osteophytic changes involving the L3-4 disc space. There is preservation of the height of the lumbar vertebral bodies and intervertebral disc spaces   No spondylolysis or listhesis   IMPRESSION: Minimal degenerative changes  PAIN:    Pain Intensity: Present: 9/10, Best: 5/10, Worst: 10/10 Pain location: lower back, Left posterior thigh and feet  Pain Quality: sharp and stabbing  Radiating: Yes  Numbness/Tingling: Yes Focal Weakness: No Aggravating factors: Prolonged walking, standing, sitting  Relieving factors: Rest, lying on back,  How long can you sit: < 10 min  How long can you stand: < 10 min  History of prior back injury, pain, surgery, or therapy: Yes Dominant hand: right  PRECAUTIONS: Fall  WEIGHT BEARING RESTRICTIONS: No  FALLS: Has patient fallen in last 6 months? No  Living Environment Lives with: Alone  Lives in: House/apartment Stairs: No Has following equipment at home: None  Prior level of function: Independent  Occupational demands: Retired   Presenter, broadcasting: Engineer, water, Cooking   Patient Goals: Relieve Pain and return PLOF    OBJECTIVE:  Patient Surveys    Cognition Patient is oriented to person, place, and time.  Attention span and concentration are intact.  Expressive speech is intact.     Gross Musculoskeletal Assessment Tremor: None Bulk: Normal Tone: Normal No visible step-off along spinal column, no signs of scoliosis  GAIT: Distance walked: 23m Assistive device utilized: None Level of assistance: Complete Independence Comments: Antalgic, Narrow Step Width, bilateral knee valgus present  Posture: Lumbar lordosis: WNL Iliac crest  height: Equal bilaterally Lumbar lateral shift: Negative  AROM AROM (Normal range in degrees) AROM   Lumbar   Flexion (65) 85% limited due to hamstring tightness  Extension (30) 100%  Right lateral flexion (25) 100%  Left lateral flexion (25) 100%  Right rotation (30) 100%  Left rotation (30) 100%      Hip Right Left  Flexion (125) WNL WNL  Extension (15)    Abduction (40)    Adduction     Internal Rotation (45) 30 30  External Rotation (45) 30 20*      Knee    Flexion (135) WNL WNL  Extension (0) WNL WNL      Ankle    Dorsiflexion (20)    Plantarflexion (50)    Inversion (35)    Eversion (15)    (* = pain; Blank rows = not tested)  LE MMT: MMT (out of 5) Right  Left   Hip flexion 3+ 3+  Hip extension    Hip abduction 4 3+  Hip adduction    Hip internal rotation    Hip external rotation    Knee flexion 4+ 4-  Knee extension 4+ 4-  Ankle dorsiflexion    Ankle plantarflexion    Ankle inversion    Ankle eversion    (* = pain; Blank rows = not tested)  Sensation Grossly intact to light touch throughout bilateral LEs as determined by testing dermatomes.   Intermittent sharp pain noted in R LE  in L5-S1 dermatomal pattern.  Reflexes Not Test  Muscle Length Hamstrings: R: Negative 20 L: Positive: 40 with reproduction of pain  Palpation Location Right Left         Lumbar paraspinals 0 0  Quadratus Lumborum 0 0  Gluteus Maximus 1 1  Gluteus Medius 1 1  Deep hip external rotators 1 2  PSIS    Fortin's Area (SIJ)    Greater Trochanter 1 2  (Blank rows = not tested) Graded on 0-4 scale (0 = no pain, 1 = pain, 2 = pain with wincing/grimacing/flinching, 3 = pain with withdrawal, 4 = unwilling to allow palpation)  Passive Accessory Intervertebral Motion Pt reports reproduction of back pain with CPA L4-S1 segments with no improvements following prolonged CPA.   Special Tests Lumbar Radiculopathy and Discogenic: Centralization and Peripheralization  (SN 92, -LR 0.12): Not Tested  Slump (SN 83, -LR 0.32): Not Tested SLR (SN 92, -LR 0.29): Not Tested  Hip: FABER (SN 81): R: Negative L: Negative FADIR (SN 94): R: Negative L: Negative  Functional Tasks  Deep squat: Narrow BOS, Hip hinge absent, decreased ankle dorsiflexion   Sit to stand: Use of BUE support, minor knee valgus present  Forward Step-Down Test: R: Pain with Knee Valgus present   TODAY'S TREATMENT: DATE: 05/06/2024   Subjective:Patient reports that she has improved sleep and functional mobility with household ADLs. Pt endorses improvements in LE strength. Currently 0/10 in lumbar spine and L posterior thigh.  No question or concerns  Therapeutic Exercise:   NuStep L5-1 x 5 min xUE/LE x > 80 spm (Seat 10) for LE warm up, endurance and strength; PT manually adjusted resistance throughout bout.     Hip Matrix Hip Abduction    R/L: 2 x 12 30#   Hip Flexion     R/L: 2 x 12 30   Dead bug Progression   UE/LE press against Green Swiss Ball    3 x 10s   Blue TB Around Opposite LE/UE     2 x 5 with TB around ea foot      Lock Clamshell (L Only)     2 x 10     There.act:   Tall Kneeling Kettlebell Around [place]   1 x 20 CW   1 x 20 CCW       Static alternating marches with KB in RUE    2 x 15 20# KB    Suitcase Carry with Tall Marching    2 x 24', 20# KB   - Minor postural R lateral sway with L SLS     Kettlebell Dead lift   1 x 11 20# KB    1 x 12 20# Kb a  PATIENT EDUCATION:  Education details: HEP, Exercises  Person educated: Patient Education method: Explanation, Actor cues, Verbal cues, and Handouts Education comprehension: verbalized understanding, returned demonstration, and needs further education   HOME EXERCISE PROGRAM:  Access Code: [email or web address] URL: [email or web address] Date: 04/23/2024  Prepared by: Satira Curet  Exercises - Supine Figure 4 Piriformis Stretch  - 1 x daily - 7 x weekly - 3 sets - 30-60 hold -  Supine Single Knee to Chest Stretch  - 1 x daily - 7 x weekly - 3 sets - 30-60 hold - Seated Hamstring Stretch  - 1 x daily - 7 x weekly - 3 sets - 30-60 hold - Supine Sciatic Nerve Glide  - 1 x daily - 7 x weekly - 1 sets - 10-12 reps - Bridge with Hip Abduction and Resistance  - 1 x daily - 3-4 x weekly - 2-3 sets - 10-12 reps - Clam with Resistance  - 1 x daily - 3-4 x weekly - 2-3 sets - 10-12 reps - Bird Dog  - 1 x daily - 3-4 x weekly - 2-3 sets - 10-12 reps - Supine Dead Bug with Leg Extension  - 1 x daily - 3-4 x weekly - 2-3 sets - 10-12 reps - Side Plank on Knees  - 1 x daily - 3-4 x weekly - 2 sets - 3 reps - 10s hold  Access Code: [email or web address] URL: [email or web address] Date: 04/08/2024 Prepared by: Veryl Gottron Ariq Khamis  Exercises - Supine Figure 4 Piriformis Stretch  - 1 x daily - 7 x weekly - 3 sets - 30-60 hold - Supine Single Knee to Chest Stretch  - 1 x daily - 7 x weekly - 3 sets - 30-60 hold - Seated Hamstring Stretch  - 1 x daily - 7 x weekly - 3 sets - 30-60 hold - Bridge with Hip Abduction and Resistance  - 1 x daily - 3-4 x weekly - 2-3 sets - 10-12 reps - Clam with Resistance  - 1 x daily - 3-4 x weekly - 2-3 sets - 10-12 reps - Bird Dog  - 1 x daily - 3-4 x weekly - 2-3 sets - 10-12 reps - Supine Dead Bug with Leg Extension  - 1 x daily - 3-4 x weekly - 2-3 sets - 10-12 reps - Side Plank on Knees  - 1 x daily - 3-4 x weekly - 2 sets - 3 reps - 10s hold   ASSESSMENT:  CLINICAL IMPRESSION: Continued PT POC with a main focus on improving lower back pain, functional strength, thigh pain. PT increased intensity with core stabilization exercises; no additional pain in L posterior thigh reported from patient. Great form noted with kettlebell dead lift; no back pain or thigh pain noted with proper hip hinge mechanic. She continues to demonstrate significant progress in LE strength, flexibility and core stabilization. Based on today's performance, pt will continue to  benefit from skilled PT in order to facilitate return to PLOF and improve QoL.   OBJECTIVE IMPAIRMENTS: Abnormal gait, decreased activity tolerance, difficulty walking, decreased ROM, decreased strength, and pain.   ACTIVITY LIMITATIONS: carrying, bending, sitting, standing, squatting, and sleeping  PARTICIPATION LIMITATIONS: driving, shopping, and community activity  PERSONAL FACTORS: Age, Social background, and Time since onset of injury/illness/exacerbation are also affecting patient's functional outcome.   REHAB POTENTIAL: Good  CLINICAL DECISION MAKING: Evolving/moderate complexity  EVALUATION COMPLEXITY: Moderate   GOALS: Goals reviewed with patient? No  SHORT TERM GOALS: Target date: 06/17/2024  Pt will be independent with HEP in order to improve strength and decrease back pain to improve pain-free function at home and work. Baseline: Pt endorses using Mychart and HEP  exercises 2-3x/wk  Goal status: MET   LONG TERM GOALS: Target date: 07/29/2024  Pt will  be able to sit for 30-60 minutes without reporting discomfort in order to demonstrate significant improvements in household ADLs and return to PLOF.  Baseline: 04/02/2024: Pt reports sitting on the couch > 45 min; 04/30/2024: Pt reports able to sitting on couch > 45 min.  Goal status: GOAL MET   2.  Pt will decrease worst back pain by at least 2 points on the NPRS in order to demonstrate clinically significant reduction in back pain. Baseline: 03/12/2024: 10/10 NPS; 04/30/2024: 5/10 NPS  Goal status: GOAL MET  3.  Pt will decrease mODI score by at least 13 points in order demonstrate clinically significant reduction in back pain/disability.     Baseline: 03/18/2024: 31 / 50 = 62.0 %; 04/30/2024: 12 / 50 = 24.0 % Goal status: Progressing   4.  Pt will increase L Hip MMT by 1 grade in order to demonstrate clinically significant improvements in LE strength.  Baseline: 03/12/2024 L Hip Flex: 3+/5, Abd: 3+/5; 04/30/2024:  Hip Flex 4-/5, Abd 4-/5 Goal status: Progressing  5.  Pt will improve 5xSTS to 12 seconds or less to demonstrate clinically significant improvement in LE strength and ability to transfer pain free Baseline: 03/18/2024: 13.47s (7.72.6 sec); 04/30/2024: 9.17s Goal status: GOAL MET   PLAN: PT FREQUENCY: 1-2x/week  PT DURATION: 12 weeks  PLANNED INTERVENTIONS: Therapeutic exercises, Therapeutic activity, Neuromuscular re-education, Balance training, Gait training, Patient/Family education, Self Care, Joint mobilization, Joint manipulationOrthotic/Fit training, DME instructions, Dry Needling, Electrical stimulation, Spinal manipulation, Spinal mobilization, Cryotherapy, Moist heat, Taping, Traction, Ultrasound, Manual therapy, and Re-evaluation.  PLAN FOR NEXT SESSION: Progress Farmers carry, L gluteal strengthening, hamstring lengthening, sciatic nerve glides Progress Hip Strength and core stabilization, manual techniques PRN    Satira Curet PT, DPT 05/06/2024, 9:48 AM

## 2024-05-07 ENCOUNTER — Other Ambulatory Visit: Payer: Self-pay | Admitting: Family Medicine

## 2024-05-07 DIAGNOSIS — G8929 Other chronic pain: Secondary | ICD-10-CM

## 2024-05-07 DIAGNOSIS — M5416 Radiculopathy, lumbar region: Secondary | ICD-10-CM

## 2024-05-07 NOTE — Telephone Encounter (Unsigned)
 Copied from CRM [id number]. Topic: Clinical - Medication Refill >> May 07, 2024  8:17 AM Clyde Darling P wrote: Medication: oxyCODONE -acetaminophen  (PERCOCET) 10-325 MG  Has the patient contacted their pharmacy? No- has to call pcp  (Agent: If no, request that the patient contact the pharmacy for the refill. If patient does not wish to contact the pharmacy document the reason why and proceed with request.) (Agent: If yes, when and what did the pharmacy advise?)  This is the patient's preferred pharmacy:  [facility] [id number] [place], [place] - [address] [place] [address] Phone: [phone] Fax: [phone]   Is this the correct pharmacy for this prescription? Yes If no, delete pharmacy and type the correct one.   Has the prescription been filled recently? No  Is the patient out of the medication? No- last couple of pills  Has the patient been seen for an appointment in the last year OR does the patient have an upcoming appointment? Yes  Can we respond through MyChart? Yes  Agent: Please be advised that Rx refills may take up to 3 business days. We ask that you follow-up with your pharmacy.

## 2024-05-08 ENCOUNTER — Ambulatory Visit

## 2024-05-08 DIAGNOSIS — M6281 Muscle weakness (generalized): Secondary | ICD-10-CM

## 2024-05-08 DIAGNOSIS — M5459 Other low back pain: Secondary | ICD-10-CM | POA: Diagnosis not present

## 2024-05-08 DIAGNOSIS — R2689 Other abnormalities of gait and mobility: Secondary | ICD-10-CM

## 2024-05-08 MED ORDER — OXYCODONE-ACETAMINOPHEN 10-325 MG PO TABS
1.0000 | ORAL_TABLET | ORAL | 0 refills | Status: AC | PRN
Start: 2024-05-08 — End: ?

## 2024-05-08 NOTE — Telephone Encounter (Signed)
 Requested medication (s) are due for refill today: na   Requested medication (s) are on the active medication list: yes   Last refill:  04/26/24 #30 0 refills   Future visit scheduled: yes in 2 months  Notes to clinic:   not delegated per protocol. Do you want to refill Rx?     Requested Prescriptions  Pending Prescriptions Disp Refills   oxyCODONE -acetaminophen  (PERCOCET) 10-325 MG tablet 30 tablet 0    Sig: Take 1 tablet by mouth every 4 (four) hours as needed for pain.     Not Delegated - Analgesics:  Opioid Agonist Combinations Failed - 05/08/2024 11:01 AM      Failed - This refill cannot be delegated      Failed - Urine Drug Screen completed in last 360 days      Failed - Valid encounter within last 3 months    Recent Outpatient Visits           3 months ago Pre-diabetes   [facility] [place], Kayleen Party, DO       Future Appointments             In 2 months [place], Kayleen Party, DO [place] [facility], [facility]

## 2024-05-08 NOTE — Therapy (Signed)
 OUTPATIENT PHYSICAL THERAPY THORACOLUMBAR TREATMENT     Patient Name: Cheryl Payne MRN: [id number] DOB:05/09/1966, 58 y.o., female Today's Date: 05/08/2024  END OF SESSION:  PT End of Session - 05/08/24 1116     Visit Number 13    Number of Visits 25    Date for PT Re-Evaluation 06/04/24    Progress Note Due on Visit 10    PT Start Time 1115    PT Stop Time 1155    PT Time Calculation (min) 40 min    Activity Tolerance Patient tolerated treatment well    Behavior During Therapy [facility] for tasks assessed/performed               Past Medical History:  Diagnosis Date   Anxiety NA   Arthritis    Blood transfusion without reported diagnosis 2023   When i was in the hospital   Cancer [facility])    lung   Chronic left shoulder pain    Depression    GERD (gastroesophageal reflux disease)    Hypertension    Ulcer 2023   When i was in the hospital   Past Surgical History:  Procedure Laterality Date   COLONOSCOPY WITH PROPOFOL  N/A 03/24/2023   Procedure: COLONOSCOPY WITH PROPOFOL ;  Surgeon: Luke Salaam, MD;  Location: [facility] ENDOSCOPY;  Service: Gastroenterology;  Laterality: N/A;   ESOPHAGOGASTRODUODENOSCOPY (EGD) WITH PROPOFOL  N/A 08/17/2022   Procedure: ESOPHAGOGASTRODUODENOSCOPY (EGD) WITH PROPOFOL ;  Surgeon: Luke Salaam, MD;  Location: [facility] ENDOSCOPY;  Service: Gastroenterology;  Laterality: N/A;   ESOPHAGOGASTRODUODENOSCOPY (EGD) WITH PROPOFOL  N/A 03/24/2023   Procedure: ESOPHAGOGASTRODUODENOSCOPY (EGD) WITH PROPOFOL ;  Surgeon: Luke Salaam, MD;  Location: [facility] ENDOSCOPY;  Service: Gastroenterology;  Laterality: N/A;   LUNG SURGERY     Patient Active Problem List   Diagnosis Date Noted   Blood on toilet paper 03/24/2023   Adenomatous polyp of colon 03/24/2023   Gastric ulcer 03/24/2023   GI bleed 08/17/2022   Upper GI bleed 08/16/2022   Leukocytosis 08/16/2022   Chronic left-sided low back pain with left-sided sciatica 09/21/2021   Pure hypercholesterolemia 07/13/2020    Major depressive disorder, recurrent, in partial remission (HCC) 07/13/2020   GAD (generalized anxiety disorder) 07/13/2020   Encounter for screening mammogram for malignant neoplasm of breast 07/13/2020   Muscle spasm of left shoulder 07/13/2020   Chronic left shoulder pain 07/13/2020   Centrilobular emphysema (HCC) 07/13/2020   Moderate aortic valve insufficiency 07/13/2020   Obesity (BMI 30.0-34.9) 07/13/2020   DOE (dyspnea on exertion) 01/10/2020   Non-small cell carcinoma of lung (HCC) 01/02/2019   Cancer of upper lobe of right lung (HCC) 11/15/2018   Lung mass 10/02/2018   Essential hypertension 09/02/2014    PCP: Raina Bunting, DO  REFERRING PROVIDER: Ludwig Safer, PA-C  REFERRING DIAG:  [phone] (ICD-10-CM) - Lumbar radiculopathy    RATIONALE FOR EVALUATION AND TREATMENT: Rehabilitation  THERAPY DIAG: Other low back pain  Muscle weakness (generalized)  Other abnormalities of gait and mobility  ONSET DATE: Chronic (2019)  FOLLOW-UP APPT SCHEDULED WITH REFERRING PROVIDER: Yes    SUBJECTIVE:  SUBJECTIVE STATEMENT:  Chronic Low Back Pain   PERTINENT HISTORY:   Patient reports that she has had lower back pain since 2019. She reports that she has seen her provider and received multiple steroid injections with little relief.  Patient reports that pain starts in the lower back and refers down the posterior side of the L leg and ends the toes. Numbness and tingling are present in the posterior thigh and plantar aspect of the left foot. No relief from recent prescription (oxycodone , tremedol, hydrocodone ). Patient states that the pain intensifies with prolonged walking, sitting or standing. She reports that she spends most of the day on the floor or lying down due to the radiating  pain. She reports that pain in the L leg that disrupts her sleep throughout the night. Denies Saddle Anaesthesia, loss b/b, fever/chills.   Imaging Per Chart Review (02/26/2024):   EXAM: LUMBAR SPINE - COMPLETE 4+ VIEW   COMPARISON:  MRI lumbar spine January 18, 2024   FINDINGS: Minimal anterior osteophytic changes involving the L3-4 disc space. There is preservation of the height of the lumbar vertebral bodies and intervertebral disc spaces   No spondylolysis or listhesis   IMPRESSION: Minimal degenerative changes  PAIN:    Pain Intensity: Present: 9/10, Best: 5/10, Worst: 10/10 Pain location: lower back, Left posterior thigh and feet  Pain Quality: sharp and stabbing  Radiating: Yes  Numbness/Tingling: Yes Focal Weakness: No Aggravating factors: Prolonged walking, standing, sitting  Relieving factors: Rest, lying on back,  How long can you sit: < 10 min  How long can you stand: < 10 min  History of prior back injury, pain, surgery, or therapy: Yes Dominant hand: right  PRECAUTIONS: Fall  WEIGHT BEARING RESTRICTIONS: No  FALLS: Has patient fallen in last 6 months? No  Living Environment Lives with: Alone  Lives in: House/apartment Stairs: No Has following equipment at home: None  Prior level of function: Independent  Occupational demands: Retired   Presenter, broadcasting: Engineer, water, Cooking   Patient Goals: Relieve Pain and return PLOF    OBJECTIVE:  Patient Surveys    Cognition Patient is oriented to person, place, and time.  Attention span and concentration are intact.  Expressive speech is intact.     Gross Musculoskeletal Assessment Tremor: None Bulk: Normal Tone: Normal No visible step-off along spinal column, no signs of scoliosis  GAIT: Distance walked: 34m Assistive device utilized: None Level of assistance: Complete Independence Comments: Antalgic, Narrow Step Width, bilateral knee valgus present  Posture: Lumbar lordosis: WNL Iliac crest  height: Equal bilaterally Lumbar lateral shift: Negative  AROM AROM (Normal range in degrees) AROM   Lumbar   Flexion (65) 85% limited due to hamstring tightness  Extension (30) 100%  Right lateral flexion (25) 100%  Left lateral flexion (25) 100%  Right rotation (30) 100%  Left rotation (30) 100%      Hip Right Left  Flexion (125) WNL WNL  Extension (15)    Abduction (40)    Adduction     Internal Rotation (45) 30 30  External Rotation (45) 30 20*      Knee    Flexion (135) WNL WNL  Extension (0) WNL WNL      Ankle    Dorsiflexion (20)    Plantarflexion (50)    Inversion (35)    Eversion (15)    (* = pain; Blank rows = not tested)  LE MMT: MMT (out of 5) Right  Left   Hip flexion 3+ 3+  Hip extension    Hip abduction 4 3+  Hip adduction    Hip internal rotation    Hip external rotation    Knee flexion 4+ 4-  Knee extension 4+ 4-  Ankle dorsiflexion    Ankle plantarflexion    Ankle inversion    Ankle eversion    (* = pain; Blank rows = not tested)  Sensation Grossly intact to light touch throughout bilateral LEs as determined by testing dermatomes.   Intermittent sharp pain noted in R LE  in L5-S1 dermatomal pattern.  Reflexes Not Test  Muscle Length Hamstrings: R: Negative 20 L: Positive: 40 with reproduction of pain  Palpation Location Right Left         Lumbar paraspinals 0 0  Quadratus Lumborum 0 0  Gluteus Maximus 1 1  Gluteus Medius 1 1  Deep hip external rotators 1 2  PSIS    Fortin's Area (SIJ)    Greater Trochanter 1 2  (Blank rows = not tested) Graded on 0-4 scale (0 = no pain, 1 = pain, 2 = pain with wincing/grimacing/flinching, 3 = pain with withdrawal, 4 = unwilling to allow palpation)  Passive Accessory Intervertebral Motion Pt reports reproduction of back pain with CPA L4-S1 segments with no improvements following prolonged CPA.   Special Tests Lumbar Radiculopathy and Discogenic: Centralization and Peripheralization  (SN 92, -LR 0.12): Not Tested  Slump (SN 83, -LR 0.32): Not Tested SLR (SN 92, -LR 0.29): Not Tested  Hip: FABER (SN 81): R: Negative L: Negative FADIR (SN 94): R: Negative L: Negative  Functional Tasks  Deep squat: Narrow BOS, Hip hinge absent, decreased ankle dorsiflexion   Sit to stand: Use of BUE support, minor knee valgus present  Forward Step-Down Test: R: Pain with Knee Valgus present   TODAY'S TREATMENT: DATE: 05/08/2024   Subjective: Patient reports minor soreness following last PT sessoin; fully reslved today. 0/10 NPS in lumbar spine and L thigh. She still experiencing intermittent muscle spasm in thigh.  No question or concerns  Therapeutic Exercise:   NuStep L5-1 x 5 min xUE/LE x > 80 spm (Seat 9) for LE warm up, endurance and strength; PT manually adjusted resistance throughout bout.     Hip Matrix Hip Abduction    R/L: 2 x 12 30#   Hip Flexion     R/L: 2 x 12 30   Dead bug Progression   UE/LE press against Green Swiss Ball    4 x 10s      Supine Pallof Press with LLE straight (4" from mat table)    2 x 10, Green TB     Time spent modifying and reviewing HEP: (See update below)   - Added additional stretches to address lower back pain and gluteal muscle lengthening  There.act:   Tall Kneeling Diagonal Chops   2 x 10 R Upper to L Lower and L lower to R upper   3kg Med ball  Tall Kneeling Kettlebell Around [place]   1 x 20 CW, 10# KB     1 x 20 CCW       Lateral Step Down with Single Rail Support    R/L: 2 x 10, Toe Touch     Kettlebell Dead lift    3 x 10 20# KB     Suitcase Carry    1 x 36', low marches    1 x 36', high marches  PATIENT EDUCATION:  Education details: HEP, Exercises  Person educated: Patient Education  method: Explanation, Tactile cues, Verbal cues, and Handouts Education comprehension: verbalized understanding, returned demonstration, and needs further education   HOME EXERCISE PROGRAM:  Access Code: [email or web address] URL:  [email or web address] Date: 05/08/2024 Prepared by: Satira Curet  Exercises - Supine Dead Bug with Leg Extension  - 1 x daily - 3-4 x weekly - 2-3 sets - 10 reps - Supine Bridge with Resistance Band  - 1 x daily - 3-4 x weekly - 2-3 sets - 10 reps - Side Stepping with Resistance at Ankles  - 1 x daily - 3-4 x weekly - 2-3 sets - 10 reps - Single Leg Balance Raising Opposite Arm and Leg  - 1 x daily - 7 x weekly - 2-3 sets - 10 reps - Supine Gluteus Stretch  - 1 x daily - 7 x weekly - 3 sets - 30s hold - Supine Piriformis Stretch with Foot on Ground  - 1 x daily - 7 x weekly - 3 sets - 30s hold - Supine Figure 4 Piriformis Stretch  - 1 x daily - 7 x weekly - 3 sets - 30s hold - Supine Sciatic Nerve Glide  - 1 x daily - 7 x weekly - 3 sets - 30s hold - Back and Glute Stretch With Ball  - 1 x daily - 7 x weekly - 3 sets - 30s hold  Access Code: [email or web address] URL: [email or web address] Date: 04/23/2024 Prepared by: Veryl Gottron Derrico Zhong  Exercises - Supine Figure 4 Piriformis Stretch  - 1 x daily - 7 x weekly - 3 sets - 30-60 hold - Supine Single Knee to Chest Stretch  - 1 x daily - 7 x weekly - 3 sets - 30-60 hold - Seated Hamstring Stretch  - 1 x daily - 7 x weekly - 3 sets - 30-60 hold - Supine Sciatic Nerve Glide  - 1 x daily - 7 x weekly - 1 sets - 10-12 reps - Bridge with Hip Abduction and Resistance  - 1 x daily - 3-4 x weekly - 2-3 sets - 10-12 reps - Clam with Resistance  - 1 x daily - 3-4 x weekly - 2-3 sets - 10-12 reps - Bird Dog  - 1 x daily - 3-4 x weekly - 2-3 sets - 10-12 reps - Supine Dead Bug with Leg Extension  - 1 x daily - 3-4 x weekly - 2-3 sets - 10-12 reps - Side Plank on Knees  - 1 x daily - 3-4 x weekly - 2 sets - 3 reps - 10s hold   ASSESSMENT:  CLINICAL IMPRESSION: Continued PT POC with main focus on improving lower back and thigh pain. She tolerated all interventions with an increase in resistance/intensity. Pt able to maintain balance and  perform tall marches with weighted KB in order to strengthening core stability. She continues to demonstrate significant progress in LE strength, flexibility and core stabilization. Based on today's performance, pt will continue to benefit from skilled PT in order to facilitate return to PLOF and improve QoL.   OBJECTIVE IMPAIRMENTS: Abnormal gait, decreased activity tolerance, difficulty walking, decreased ROM, decreased strength, and pain.   ACTIVITY LIMITATIONS: carrying, bending, sitting, standing, squatting, and sleeping  PARTICIPATION LIMITATIONS: driving, shopping, and community activity  PERSONAL FACTORS: Age, Social background, and Time since onset of injury/illness/exacerbation are also affecting patient's functional outcome.   REHAB POTENTIAL: Good  CLINICAL DECISION MAKING: Evolving/moderate complexity  EVALUATION COMPLEXITY: Moderate   GOALS: Goals reviewed with patient? No  SHORT TERM GOALS: Target date: 06/19/2024  Pt  will be independent with HEP in order to improve strength and decrease back pain to improve pain-free function at home and work. Baseline: Pt endorses using Mychart and HEP  exercises 2-3x/wk  Goal status: MET   LONG TERM GOALS: Target date: 07/31/2024  Pt will be able to sit for 30-60 minutes without reporting discomfort in order to demonstrate significant improvements in household ADLs and return to PLOF.  Baseline: 04/02/2024: Pt reports sitting on the couch > 45 min; 04/30/2024: Pt reports able to sitting on couch > 45 min.  Goal status: GOAL MET   2.  Pt will decrease worst back pain by at least 2 points on the NPRS in order to demonstrate clinically significant reduction in back pain. Baseline: 03/12/2024: 10/10 NPS; 04/30/2024: 5/10 NPS  Goal status: GOAL MET  3.  Pt will decrease mODI score by at least 13 points in order demonstrate clinically significant reduction in back pain/disability.     Baseline: 03/18/2024: 31 / 50 = 62.0 %; 04/30/2024:  12 / 50 = 24.0 % Goal status: Progressing   4.  Pt will increase L Hip MMT by 1 grade in order to demonstrate clinically significant improvements in LE strength.  Baseline: 03/12/2024 L Hip Flex: 3+/5, Abd: 3+/5; 04/30/2024: Hip Flex 4-/5, Abd 4-/5 Goal status: Progressing  5.  Pt will improve 5xSTS to 12 seconds or less to demonstrate clinically significant improvement in LE strength and ability to transfer pain free Baseline: 03/18/2024: 13.47s (7.72.6 sec); 04/30/2024: 9.17s Goal status: GOAL MET   PLAN: PT FREQUENCY: 1-2x/week  PT DURATION: 12 weeks  PLANNED INTERVENTIONS: Therapeutic exercises, Therapeutic activity, Neuromuscular re-education, Balance training, Gait training, Patient/Family education, Self Care, Joint mobilization, Joint manipulationOrthotic/Fit training, DME instructions, Dry Needling, Electrical stimulation, Spinal manipulation, Spinal mobilization, Cryotherapy, Moist heat, Taping, Traction, Ultrasound, Manual therapy, and Re-evaluation.  PLAN FOR NEXT SESSION: Progress Farmers carry, L gluteal strengthening, hamstring lengthening, sciatic nerve glides Progress Hip Strength and core stabilization, manual techniques PRN    Satira Curet PT, DPT 05/08/2024, 11:18 AM

## 2024-05-11 ENCOUNTER — Other Ambulatory Visit: Payer: Self-pay | Admitting: Family Medicine

## 2024-05-11 DIAGNOSIS — F411 Generalized anxiety disorder: Secondary | ICD-10-CM

## 2024-05-11 DIAGNOSIS — F3341 Major depressive disorder, recurrent, in partial remission: Secondary | ICD-10-CM

## 2024-05-13 NOTE — Telephone Encounter (Signed)
 Requested Prescriptions  Pending Prescriptions Disp Refills   escitalopram  (LEXAPRO ) 20 MG tablet [Pharmacy Med Name: ESCITALOPRAM  20 MG TABLET] 90 tablet 0    Sig: TAKE 1 TABLET BY MOUTH AT BEDTIME     Psychiatry:  Antidepressants - SSRI Passed - 05/13/2024  2:02 PM      Passed - Completed PHQ-2 or PHQ-9 in the last 360 days      Passed - Valid encounter within last 6 months    Recent Outpatient Visits           3 months ago Pre-diabetes   [facility], Kayleen Party, DO       Future Appointments             In 2 months [place], Kayleen Party, DO [place] [facility]

## 2024-05-14 ENCOUNTER — Ambulatory Visit

## 2024-05-14 DIAGNOSIS — R2689 Other abnormalities of gait and mobility: Secondary | ICD-10-CM | POA: Diagnosis not present

## 2024-05-14 DIAGNOSIS — M5459 Other low back pain: Secondary | ICD-10-CM

## 2024-05-14 DIAGNOSIS — M6281 Muscle weakness (generalized): Secondary | ICD-10-CM | POA: Diagnosis not present

## 2024-05-14 NOTE — Therapy (Signed)
 OUTPATIENT PHYSICAL THERAPY THORACOLUMBAR TREATMENT     Patient Name: Cheryl Payne MRN: [id number] DOB:[birth year], 58 y.o., female Today's Date: 05/14/2024  END OF SESSION:  PT End of Session - 05/14/24 1032     Visit Number 14    Number of Visits 25    Date for PT Re-Evaluation 06/04/24    Progress Note Due on Visit 10    PT Start Time 1030    PT Stop Time 1110    PT Time Calculation (min) 40 min    Activity Tolerance Patient tolerated treatment well    Behavior During Therapy [facility] for tasks assessed/performed               Past Medical History:  Diagnosis Date   Anxiety NA   Arthritis    Blood transfusion without reported diagnosis 2023   When i was in the hospital   Cancer [facility])    lung   Chronic left shoulder pain    Depression    GERD (gastroesophageal reflux disease)    Hypertension    Ulcer 2023   When i was in the hospital   Past Surgical History:  Procedure Laterality Date   COLONOSCOPY WITH PROPOFOL  N/A 03/24/2023   Procedure: COLONOSCOPY WITH PROPOFOL ;  Surgeon: Luke Salaam, MD;  Location: [facility] ENDOSCOPY;  Service: Gastroenterology;  Laterality: N/A;   ESOPHAGOGASTRODUODENOSCOPY (EGD) WITH PROPOFOL  N/A 08/17/2022   Procedure: ESOPHAGOGASTRODUODENOSCOPY (EGD) WITH PROPOFOL ;  Surgeon: Luke Salaam, MD;  Location: [facility] ENDOSCOPY;  Service: Gastroenterology;  Laterality: N/A;   ESOPHAGOGASTRODUODENOSCOPY (EGD) WITH PROPOFOL  N/A 03/24/2023   Procedure: ESOPHAGOGASTRODUODENOSCOPY (EGD) WITH PROPOFOL ;  Surgeon: Luke Salaam, MD;  Location: [facility] ENDOSCOPY;  Service: Gastroenterology;  Laterality: N/A;   LUNG SURGERY     Patient Active Problem List   Diagnosis Date Noted   Blood on toilet paper 03/24/2023   Adenomatous polyp of colon 03/24/2023   Gastric ulcer 03/24/2023   GI bleed 08/17/2022   Upper GI bleed 08/16/2022   Leukocytosis 08/16/2022   Chronic left-sided low back pain with left-sided sciatica 09/21/2021   Pure hypercholesterolemia  07/13/2020   Major depressive disorder, recurrent, in partial remission (HCC) 07/13/2020   GAD (generalized anxiety disorder) 07/13/2020   Encounter for screening mammogram for malignant neoplasm of breast 07/13/2020   Muscle spasm of left shoulder 07/13/2020   Chronic left shoulder pain 07/13/2020   Centrilobular emphysema (HCC) 07/13/2020   Moderate aortic valve insufficiency 07/13/2020   Obesity (BMI 30.0-34.9) 07/13/2020   DOE (dyspnea on exertion) 01/10/2020   Non-small cell carcinoma of lung (HCC) 01/02/2019   Cancer of upper lobe of right lung (HCC) 11/15/2018   Lung mass 10/02/2018   Essential hypertension 09/02/2014    PCP: Raina Bunting, DO  REFERRING PROVIDER: Ludwig Safer, PA-C  REFERRING DIAG:  [phone] (ICD-10-CM) - Lumbar radiculopathy    RATIONALE FOR EVALUATION AND TREATMENT: Rehabilitation  THERAPY DIAG: Other low back pain  Muscle weakness (generalized)  Other abnormalities of gait and mobility  ONSET DATE: Chronic (2019)  FOLLOW-UP APPT SCHEDULED WITH REFERRING PROVIDER: Yes    SUBJECTIVE:  SUBJECTIVE STATEMENT:  Chronic Low Back Pain   PERTINENT HISTORY:   Patient reports that she has had lower back pain since 2019. She reports that she has seen her provider and received multiple steroid injections with little relief.  Patient reports that pain starts in the lower back and refers down the posterior side of the L leg and ends the toes. Numbness and tingling are present in the posterior thigh and plantar aspect of the left foot. No relief from recent prescription (oxycodone , tremedol, hydrocodone ). Patient states that the pain intensifies with prolonged walking, sitting or standing. She reports that she spends most of the day on the floor or lying down due to the  radiating pain. She reports that pain in the L leg that disrupts her sleep throughout the night. Denies Saddle Anaesthesia, loss b/b, fever/chills.   Imaging Per Chart Review (02/26/2024):   EXAM: LUMBAR SPINE - COMPLETE 4+ VIEW   COMPARISON:  MRI lumbar spine January 18, 2024   FINDINGS: Minimal anterior osteophytic changes involving the L3-4 disc space. There is preservation of the height of the lumbar vertebral bodies and intervertebral disc spaces   No spondylolysis or listhesis   IMPRESSION: Minimal degenerative changes  PAIN:    Pain Intensity: Present: 9/10, Best: 5/10, Worst: 10/10 Pain location: lower back, Left posterior thigh and feet  Pain Quality: sharp and stabbing  Radiating: Yes  Numbness/Tingling: Yes Focal Weakness: No Aggravating factors: Prolonged walking, standing, sitting  Relieving factors: Rest, lying on back,  How long can you sit: < 10 min  How long can you stand: < 10 min  History of prior back injury, pain, surgery, or therapy: Yes Dominant hand: right  PRECAUTIONS: Fall  WEIGHT BEARING RESTRICTIONS: No  FALLS: Has patient fallen in last 6 months? No  Living Environment Lives with: Alone  Lives in: House/apartment Stairs: No Has following equipment at home: None  Prior level of function: Independent  Occupational demands: Retired   Presenter, broadcasting: Engineer, water, Cooking   Patient Goals: Relieve Pain and return PLOF    OBJECTIVE:  Patient Surveys    Cognition Patient is oriented to person, place, and time.  Attention span and concentration are intact.  Expressive speech is intact.     Gross Musculoskeletal Assessment Tremor: None Bulk: Normal Tone: Normal No visible step-off along spinal column, no signs of scoliosis  GAIT: Distance walked: 5m Assistive device utilized: None Level of assistance: Complete Independence Comments: Antalgic, Narrow Step Width, bilateral knee valgus present  Posture: Lumbar lordosis:  WNL Iliac crest height: Equal bilaterally Lumbar lateral shift: Negative  AROM AROM (Normal range in degrees) AROM   Lumbar   Flexion (65) 85% limited due to hamstring tightness  Extension (30) 100%  Right lateral flexion (25) 100%  Left lateral flexion (25) 100%  Right rotation (30) 100%  Left rotation (30) 100%      Hip Right Left  Flexion (125) WNL WNL  Extension (15)    Abduction (40)    Adduction     Internal Rotation (45) 30 30  External Rotation (45) 30 20*      Knee    Flexion (135) WNL WNL  Extension (0) WNL WNL      Ankle    Dorsiflexion (20)    Plantarflexion (50)    Inversion (35)    Eversion (15)    (* = pain; Blank rows = not tested)  LE MMT: MMT (out of 5) Right  Left   Hip flexion 3+ 3+  Hip extension    Hip abduction 4 3+  Hip adduction    Hip internal rotation    Hip external rotation    Knee flexion 4+ 4-  Knee extension 4+ 4-  Ankle dorsiflexion    Ankle plantarflexion    Ankle inversion    Ankle eversion    (* = pain; Blank rows = not tested)  Sensation Grossly intact to light touch throughout bilateral LEs as determined by testing dermatomes.   Intermittent sharp pain noted in R LE  in L5-S1 dermatomal pattern.  Reflexes Not Test  Muscle Length Hamstrings: R: Negative 20 L: Positive: 40 with reproduction of pain  Palpation Location Right Left         Lumbar paraspinals 0 0  Quadratus Lumborum 0 0  Gluteus Maximus 1 1  Gluteus Medius 1 1  Deep hip external rotators 1 2  PSIS    Fortin's Area (SIJ)    Greater Trochanter 1 2  (Blank rows = not tested) Graded on 0-4 scale (0 = no pain, 1 = pain, 2 = pain with wincing/grimacing/flinching, 3 = pain with withdrawal, 4 = unwilling to allow palpation)  Passive Accessory Intervertebral Motion Pt reports reproduction of back pain with CPA L4-S1 segments with no improvements following prolonged CPA.   Special Tests Lumbar Radiculopathy and Discogenic: Centralization and  Peripheralization (SN 92, -LR 0.12): Not Tested  Slump (SN 83, -LR 0.32): Not Tested SLR (SN 92, -LR 0.29): Not Tested  Hip: FABER (SN 81): R: Negative L: Negative FADIR (SN 94): R: Negative L: Negative  Functional Tasks  Deep squat: Narrow BOS, Hip hinge absent, decreased ankle dorsiflexion   Sit to stand: Use of BUE support, minor knee valgus present  Forward Step-Down Test: R: Pain with Knee Valgus present   TODAY'S TREATMENT: DATE: 05/14/2024   Subjective: Patient reports minor soreness following last PT session but resolved in one day. Pt reported relaxing weekend, no HEP performed throughout the weekend.  0/10 pain in the lower back and thigh. Pt reports no muscle spasms or shooting pain in L posterior thigh. No question or concerns  Therapeutic Exercise:   NuStep L5-1 x 5 min xUE/LE x > 80 spm (Seat 9) for LE warm up, endurance and strength; PT manually adjusted resistance throughout bout.     Hip Matrix Hip Abduction    R/L: 2 x 12 25#, 1 x 10 35#    Hip Extension      R/L: 2 x 12 40#   Dead Bug for core stability    Alternating LE/UE    1 x 12     2 x 12    Tactile cues for proper LE placement and tempo        Press Against Swiss Ball (Green)     3 x 10s     Flutter Kicks      2 x 10s (PT assistance for foot height)    Therapeutic Activity:   Kettle Bell Swing   3 x 10 x 10# KB (Seated rest break in b/t sets)    Dumbbell RDL    2 x 10, 10# DB in UE     Lateral Stepping against resistance   2 x 24', blue tb around ankles    PATIENT EDUCATION:  Education details: HEP, Exercises  Person educated: Patient Education method: Explanation, Actor cues, Verbal cues, and Handouts Education comprehension: verbalized understanding, returned demonstration, and needs further education   HOME EXERCISE PROGRAM:  Access Code: [email or web address]  URL: [email or web address] Date: 05/08/2024 Prepared by: Satira Curet  Exercises - Supine Dead Bug with Leg  Extension  - 1 x daily - 3-4 x weekly - 2-3 sets - 10 reps - Supine Bridge with Resistance Band  - 1 x daily - 3-4 x weekly - 2-3 sets - 10 reps - Side Stepping with Resistance at Ankles  - 1 x daily - 3-4 x weekly - 2-3 sets - 10 reps - Single Leg Balance Raising Opposite Arm and Leg  - 1 x daily - 7 x weekly - 2-3 sets - 10 reps - Supine Gluteus Stretch  - 1 x daily - 7 x weekly - 3 sets - 30s hold - Supine Piriformis Stretch with Foot on Ground  - 1 x daily - 7 x weekly - 3 sets - 30s hold - Supine Figure 4 Piriformis Stretch  - 1 x daily - 7 x weekly - 3 sets - 30s hold - Supine Sciatic Nerve Glide  - 1 x daily - 7 x weekly - 3 sets - 30s hold - Back and Glute Stretch With Ball  - 1 x daily - 7 x weekly - 3 sets - 30s hold  Access Code: [email or web address] URL: [email or web address] Date: 04/23/2024 Prepared by: Veryl Gottron Cheresa Siers  Exercises - Supine Figure 4 Piriformis Stretch  - 1 x daily - 7 x weekly - 3 sets - 30-60 hold - Supine Single Knee to Chest Stretch  - 1 x daily - 7 x weekly - 3 sets - 30-60 hold - Seated Hamstring Stretch  - 1 x daily - 7 x weekly - 3 sets - 30-60 hold - Supine Sciatic Nerve Glide  - 1 x daily - 7 x weekly - 1 sets - 10-12 reps - Bridge with Hip Abduction and Resistance  - 1 x daily - 3-4 x weekly - 2-3 sets - 10-12 reps - Clam with Resistance  - 1 x daily - 3-4 x weekly - 2-3 sets - 10-12 reps - Bird Dog  - 1 x daily - 3-4 x weekly - 2-3 sets - 10-12 reps - Supine Dead Bug with Leg Extension  - 1 x daily - 3-4 x weekly - 2-3 sets - 10-12 reps - Side Plank on Knees  - 1 x daily - 3-4 x weekly - 2 sets - 3 reps - 10s hold   ASSESSMENT:  CLINICAL IMPRESSION: Continued PT POC with main focus on improving lower back and thigh pain. Pt tolerated all interventions without report of additional pain in the L thigh or lower back. Great ability to perform higher level strengthening exercise focused on core/gluteal strength. Kettle bell swing with proper spine  alignment and hip hinge, minimal cues from PT for velocity.  PT increased intensity with core stabilization exercises to include dynamic movements with extremities for increased core engagement. PT discussed POC; reaching end of POC and will continue progressive strengthening in order to maintain progress. Based on today's performance, pt will continue to benefit from skilled PT in order to facilitate return to PLOF and improve QoL.   OBJECTIVE IMPAIRMENTS: Abnormal gait, decreased activity tolerance, difficulty walking, decreased ROM, decreased strength, and pain.   ACTIVITY LIMITATIONS: carrying, bending, sitting, standing, squatting, and sleeping  PARTICIPATION LIMITATIONS: driving, shopping, and community activity  PERSONAL FACTORS: Age, Social background, and Time since onset of injury/illness/exacerbation are also affecting patient's functional outcome.   REHAB POTENTIAL: Good  CLINICAL DECISION MAKING: Evolving/moderate complexity  EVALUATION COMPLEXITY: Moderate   GOALS: Goals reviewed  with patient? No  SHORT TERM GOALS: Target date: 06/25/2024  Pt will be independent with HEP in order to improve strength and decrease back pain to improve pain-free function at home and work. Baseline: Pt endorses using Mychart and HEP  exercises 2-3x/wk  Goal status: MET   LONG TERM GOALS: Target date: 08/06/2024  Pt will be able to sit for 30-60 minutes without reporting discomfort in order to demonstrate significant improvements in household ADLs and return to PLOF.  Baseline: 04/02/2024: Pt reports sitting on the couch > 45 min; 04/30/2024: Pt reports able to sitting on couch > 45 min.  Goal status: GOAL MET   2.  Pt will decrease worst back pain by at least 2 points on the NPRS in order to demonstrate clinically significant reduction in back pain. Baseline: 03/12/2024: 10/10 NPS; 04/30/2024: 5/10 NPS  Goal status: GOAL MET  3.  Pt will decrease mODI score by at least 13 points in order  demonstrate clinically significant reduction in back pain/disability.     Baseline: 03/18/2024: 31 / 50 = 62.0 %; 04/30/2024: 12 / 50 = 24.0 % Goal status: Progressing   4.  Pt will increase L Hip MMT by 1 grade in order to demonstrate clinically significant improvements in LE strength.  Baseline: 03/12/2024 L Hip Flex: 3+/5, Abd: 3+/5; 04/30/2024: Hip Flex 4-/5, Abd 4-/5 Goal status: Progressing  5.  Pt will improve 5xSTS to 12 seconds or less to demonstrate clinically significant improvement in LE strength and ability to transfer pain free Baseline: 03/18/2024: 13.47s (7.72.6 sec); 04/30/2024: 9.17s Goal status: GOAL MET   PLAN: PT FREQUENCY: 1-2x/week  PT DURATION: 12 weeks  PLANNED INTERVENTIONS: Therapeutic exercises, Therapeutic activity, Neuromuscular re-education, Balance training, Gait training, Patient/Family education, Self Care, Joint mobilization, Joint manipulationOrthotic/Fit training, DME instructions, Dry Needling, Electrical stimulation, Spinal manipulation, Spinal mobilization, Cryotherapy, Moist heat, Taping, Traction, Ultrasound, Manual therapy, and Re-evaluation.  PLAN FOR NEXT SESSION: Progress Farmers carry, L gluteal strengthening, hamstring lengthening, sciatic nerve glides Progress Hip Strength and core stabilization, manual techniques PRN    Satira Curet PT, DPT 05/14/2024, 10:33 AM

## 2024-05-20 ENCOUNTER — Encounter

## 2024-05-20 ENCOUNTER — Ambulatory Visit

## 2024-05-20 ENCOUNTER — Telehealth: Payer: Self-pay | Admitting: Family Medicine

## 2024-05-20 ENCOUNTER — Telehealth: Payer: Self-pay

## 2024-05-20 DIAGNOSIS — G8929 Other chronic pain: Secondary | ICD-10-CM

## 2024-05-20 DIAGNOSIS — M5459 Other low back pain: Secondary | ICD-10-CM

## 2024-05-20 DIAGNOSIS — M6281 Muscle weakness (generalized): Secondary | ICD-10-CM | POA: Diagnosis not present

## 2024-05-20 DIAGNOSIS — R2689 Other abnormalities of gait and mobility: Secondary | ICD-10-CM | POA: Diagnosis not present

## 2024-05-20 DIAGNOSIS — M5416 Radiculopathy, lumbar region: Secondary | ICD-10-CM

## 2024-05-20 MED ORDER — OXYCODONE-ACETAMINOPHEN 10-325 MG PO TABS: 1.0000 | ORAL_TABLET | ORAL | 0 refills | Status: DC | PRN

## 2024-05-20 NOTE — Telephone Encounter (Signed)
 LM advising Rx was sent to pharmacy.

## 2024-05-20 NOTE — Therapy (Signed)
 OUTPATIENT PHYSICAL THERAPY THORACOLUMBAR TREATMENT     Patient Name: Cheryl Payne MRN: [id number] DOB:12-24-1965, 58 y.o., female Today's Date: 05/20/2024  END OF SESSION:  PT End of Session - 05/20/24 1436     Visit Number 15    Number of Visits 25    Date for PT Re-Evaluation 06/04/24    Progress Note Due on Visit 10    PT Start Time 1435    PT Stop Time 1515    PT Time Calculation (min) 40 min    Activity Tolerance Patient tolerated treatment well    Behavior During Therapy [facility] for tasks assessed/performed           Past Medical History:  Diagnosis Date   Anxiety NA   Arthritis    Blood transfusion without reported diagnosis 2023   When i was in the hospital   Cancer [facility])    lung   Chronic left shoulder pain    Depression    GERD (gastroesophageal reflux disease)    Hypertension    Ulcer 2023   When i was in the hospital   Past Surgical History:  Procedure Laterality Date   COLONOSCOPY WITH PROPOFOL  N/A 03/24/2023   Procedure: COLONOSCOPY WITH PROPOFOL ;  Surgeon: Luke Salaam, MD;  Location: [facility] ENDOSCOPY;  Service: Gastroenterology;  Laterality: N/A;   ESOPHAGOGASTRODUODENOSCOPY (EGD) WITH PROPOFOL  N/A 08/17/2022   Procedure: ESOPHAGOGASTRODUODENOSCOPY (EGD) WITH PROPOFOL ;  Surgeon: Luke Salaam, MD;  Location: [facility] ENDOSCOPY;  Service: Gastroenterology;  Laterality: N/A;   ESOPHAGOGASTRODUODENOSCOPY (EGD) WITH PROPOFOL  N/A 03/24/2023   Procedure: ESOPHAGOGASTRODUODENOSCOPY (EGD) WITH PROPOFOL ;  Surgeon: Luke Salaam, MD;  Location: [facility] ENDOSCOPY;  Service: Gastroenterology;  Laterality: N/A;   LUNG SURGERY     Patient Active Problem List   Diagnosis Date Noted   Blood on toilet paper 03/24/2023   Adenomatous polyp of colon 03/24/2023   Gastric ulcer 03/24/2023   GI bleed 08/17/2022   Upper GI bleed 08/16/2022   Leukocytosis 08/16/2022   Chronic left-sided low back pain with left-sided sciatica 09/21/2021   Pure hypercholesterolemia 07/13/2020    Major depressive disorder, recurrent, in partial remission (HCC) 07/13/2020   GAD (generalized anxiety disorder) 07/13/2020   Encounter for screening mammogram for malignant neoplasm of breast 07/13/2020   Muscle spasm of left shoulder 07/13/2020   Chronic left shoulder pain 07/13/2020   Centrilobular emphysema (HCC) 07/13/2020   Moderate aortic valve insufficiency 07/13/2020   Obesity (BMI 30.0-34.9) 07/13/2020   DOE (dyspnea on exertion) 01/10/2020   Non-small cell carcinoma of lung (HCC) 01/02/2019   Cancer of upper lobe of right lung (HCC) 11/15/2018   Lung mass 10/02/2018   Essential hypertension 09/02/2014    PCP: Raina Bunting, DO  REFERRING PROVIDER: Ludwig Safer, PA-C  REFERRING DIAG:  M54.16 (ICD-10-CM) - Lumbar radiculopathy    RATIONALE FOR EVALUATION AND TREATMENT: Rehabilitation  THERAPY DIAG: Muscle weakness (generalized)  Other low back pain  Other abnormalities of gait and mobility  ONSET DATE: Chronic (2019)  FOLLOW-UP APPT SCHEDULED WITH REFERRING PROVIDER: Yes    SUBJECTIVE:  SUBJECTIVE STATEMENT:  Chronic Low Back Pain   PERTINENT HISTORY:   Patient reports that she has had lower back pain since 2019. She reports that she has seen her provider and received multiple steroid injections with little relief.  Patient reports that pain starts in the lower back and refers down the posterior side of the L leg and ends the toes. Numbness and tingling are present in the posterior thigh and plantar aspect of the left foot. No relief from recent prescription (oxycodone , tremedol, hydrocodone ). Patient states that the pain intensifies with prolonged walking, sitting or standing. She reports that she spends most of the day on the floor or lying down due to the radiating  pain. She reports that pain in the L leg that disrupts her sleep throughout the night. Denies Saddle Anaesthesia, loss b/b, fever/chills.   Imaging Per Chart Review (02/26/2024):   EXAM: LUMBAR SPINE - COMPLETE 4+ VIEW   COMPARISON:  MRI lumbar spine January 18, 2024   FINDINGS: Minimal anterior osteophytic changes involving the L3-4 disc space. There is preservation of the height of the lumbar vertebral bodies and intervertebral disc spaces   No spondylolysis or listhesis   IMPRESSION: Minimal degenerative changes  PAIN:    Pain Intensity: Present: 9/10, Best: 5/10, Worst: 10/10 Pain location: lower back, Left posterior thigh and feet  Pain Quality: sharp and stabbing  Radiating: Yes  Numbness/Tingling: Yes Focal Weakness: No Aggravating factors: Prolonged walking, standing, sitting  Relieving factors: Rest, lying on back,  How long can you sit: < 10 min  How long can you stand: < 10 min  History of prior back injury, pain, surgery, or therapy: Yes Dominant hand: right  PRECAUTIONS: Fall  WEIGHT BEARING RESTRICTIONS: No  FALLS: Has patient fallen in last 6 months? No  Living Environment Lives with: Alone  Lives in: House/apartment Stairs: No Has following equipment at home: None  Prior level of function: Independent  Occupational demands: Retired   Presenter, broadcasting: Engineer, water, Cooking   Patient Goals: Relieve Pain and return PLOF    OBJECTIVE:  Patient Surveys    Cognition Patient is oriented to person, place, and time.  Attention span and concentration are intact.  Expressive speech is intact.     Gross Musculoskeletal Assessment Tremor: None Bulk: Normal Tone: Normal No visible step-off along spinal column, no signs of scoliosis  GAIT: Distance walked: 63m Assistive device utilized: None Level of assistance: Complete Independence Comments: Antalgic, Narrow Step Width, bilateral knee valgus present  Posture: Lumbar lordosis: WNL Iliac crest  height: Equal bilaterally Lumbar lateral shift: Negative  AROM AROM (Normal range in degrees) AROM   Lumbar   Flexion (65) 85% limited due to hamstring tightness  Extension (30) 100%  Right lateral flexion (25) 100%  Left lateral flexion (25) 100%  Right rotation (30) 100%  Left rotation (30) 100%      Hip Right Left  Flexion (125) WNL WNL  Extension (15)    Abduction (40)    Adduction     Internal Rotation (45) 30 30  External Rotation (45) 30 20*      Knee    Flexion (135) WNL WNL  Extension (0) WNL WNL      Ankle    Dorsiflexion (20)    Plantarflexion (50)    Inversion (35)    Eversion (15)    (* = pain; Blank rows = not tested)  LE MMT: MMT (out of 5) Right  Left   Hip flexion 3+ 3+  Hip extension    Hip abduction 4 3+  Hip adduction    Hip internal rotation    Hip external rotation    Knee flexion 4+ 4-  Knee extension 4+ 4-  Ankle dorsiflexion    Ankle plantarflexion    Ankle inversion    Ankle eversion    (* = pain; Blank rows = not tested)  Sensation Grossly intact to light touch throughout bilateral LEs as determined by testing dermatomes.   Intermittent sharp pain noted in R LE  in L5-S1 dermatomal pattern.  Reflexes Not Test  Muscle Length Hamstrings: R: Negative 20 L: Positive: 40 with reproduction of pain  Palpation Location Right Left         Lumbar paraspinals 0 0  Quadratus Lumborum 0 0  Gluteus Maximus 1 1  Gluteus Medius 1 1  Deep hip external rotators 1 2  PSIS    Fortin's Area (SIJ)    Greater Trochanter 1 2  (Blank rows = not tested) Graded on 0-4 scale (0 = no pain, 1 = pain, 2 = pain with wincing/grimacing/flinching, 3 = pain with withdrawal, 4 = unwilling to allow palpation)  Passive Accessory Intervertebral Motion Pt reports reproduction of back pain with CPA L4-S1 segments with no improvements following prolonged CPA.   Special Tests Lumbar Radiculopathy and Discogenic: Centralization and Peripheralization  (SN 92, -LR 0.12): Not Tested  Slump (SN 83, -LR 0.32): Not Tested SLR (SN 92, -LR 0.29): Not Tested  Hip: FABER (SN 81): R: Negative L: Negative FADIR (SN 94): R: Negative L: Negative  Functional Tasks  Deep squat: Narrow BOS, Hip hinge absent, decreased ankle dorsiflexion   Sit to stand: Use of BUE support, minor knee valgus present  Forward Step-Down Test: R: Pain with Knee Valgus present   TODAY'S TREATMENT: DATE: 05/20/2024   Subjective: Patient reports exacerbation of pain in R lower back but resolved with HEP stretches and core stabilization. Pt reports no muscle spasms or shooting pain in L posterior thigh. No question or concerns  Therapeutic Exercise:   Hip Matrix Hip Abduction    R/L: 2 x 12 25#, 1 x 10 35#    Dead Bug for core stability    Alternating LE/UE    3 x 10        Bear Plank for core stability   3 x 10s hold     Chops with Med Ball (3 Kg)   1 x 10 R/L direction    1 x 10 R upper to L lower   1 x 10  L lower to R upper    Therapeutic Activity:   Squat with Dumbbell Chest Press  1 x 10 4# DB  1 x 10 3# DB   Kettle Bell Swing   1 x 10 x 10# KB    1 x 6 x 20# KB    1 x 10 x 10 # KB     Tall marching Suitcase Carry     3 x 28' , SBA  Standing Kettle bell torso passes on airex    2 x 10, (5 CW 5 CCW), 10# KB       PATIENT EDUCATION:  Education details: HEP, Exercises  Person educated: Patient Education method: Explanation, Actor cues, Verbal cues, and Handouts Education comprehension: verbalized understanding, returned demonstration, and needs further education   HOME EXERCISE PROGRAM:  Access Code: [email or web address] URL: [email or web address] Date: 05/08/2024 Prepared by: Satira Curet  Exercises - Supine Dead Bug with Leg Extension  -  1 x daily - 3-4 x weekly - 2-3 sets - 10 reps - Supine Bridge with Resistance Band  - 1 x daily - 3-4 x weekly - 2-3 sets - 10 reps - Side Stepping with Resistance at Ankles  - 1 x daily  - 3-4 x weekly - 2-3 sets - 10 reps - Single Leg Balance Raising Opposite Arm and Leg  - 1 x daily - 7 x weekly - 2-3 sets - 10 reps - Supine Gluteus Stretch  - 1 x daily - 7 x weekly - 3 sets - 30s hold - Supine Piriformis Stretch with Foot on Ground  - 1 x daily - 7 x weekly - 3 sets - 30s hold - Supine Figure 4 Piriformis Stretch  - 1 x daily - 7 x weekly - 3 sets - 30s hold - Supine Sciatic Nerve Glide  - 1 x daily - 7 x weekly - 3 sets - 30s hold - Back and Glute Stretch With Ball  - 1 x daily - 7 x weekly - 3 sets - 30s hold  Access Code: [email or web address] URL: [email or web address] Date: 04/23/2024 Prepared by: Veryl Gottron Briyan Kleven  Exercises - Supine Figure 4 Piriformis Stretch  - 1 x daily - 7 x weekly - 3 sets - 30-60 hold - Supine Single Knee to Chest Stretch  - 1 x daily - 7 x weekly - 3 sets - 30-60 hold - Seated Hamstring Stretch  - 1 x daily - 7 x weekly - 3 sets - 30-60 hold - Supine Sciatic Nerve Glide  - 1 x daily - 7 x weekly - 1 sets - 10-12 reps - Bridge with Hip Abduction and Resistance  - 1 x daily - 3-4 x weekly - 2-3 sets - 10-12 reps - Clam with Resistance  - 1 x daily - 3-4 x weekly - 2-3 sets - 10-12 reps - Bird Dog  - 1 x daily - 3-4 x weekly - 2-3 sets - 10-12 reps - Supine Dead Bug with Leg Extension  - 1 x daily - 3-4 x weekly - 2-3 sets - 10-12 reps - Side Plank on Knees  - 1 x daily - 3-4 x weekly - 2 sets - 3 reps - 10s hold   ASSESSMENT:  CLINICAL IMPRESSION: Continued PT POC with main focus on improving lower back and thigh pain. Pt continues to tolerate increased intensity with core stability interventions. She will monitor lower back pain and posterior thigh pain with upcoming trip to the beach. Good crossover from last session with kettlebell swing without cues from PT. Minor postural sway in lateral direction with suitcase carry; no improvement with additional repetitions; PT advised patient to continue working on single leg balance at home with 1L  water jugs. PT will continue to monitor progress with last couple session and focus on increased functional strength. Based on today's performance, pt will continue to benefit from skilled PT in order to facilitate return to PLOF and improve QoL.   OBJECTIVE IMPAIRMENTS: Abnormal gait, decreased activity tolerance, difficulty walking, decreased ROM, decreased strength, and pain.   ACTIVITY LIMITATIONS: carrying, bending, sitting, standing, squatting, and sleeping  PARTICIPATION LIMITATIONS: driving, shopping, and community activity  PERSONAL FACTORS: Age, Social background, and Time since onset of injury/illness/exacerbation are also affecting patient's functional outcome.   REHAB POTENTIAL: Good  CLINICAL DECISION MAKING: Evolving/moderate complexity  EVALUATION COMPLEXITY: Moderate   GOALS: Goals reviewed with patient? No  SHORT TERM GOALS: Target date: 07/01/2024  Pt will be  independent with HEP in order to improve strength and decrease back pain to improve pain-free function at home and work. Baseline: Pt endorses using Mychart and HEP  exercises 2-3x/wk  Goal status: MET   LONG TERM GOALS: Target date: 08/12/2024  Pt will be able to sit for 30-60 minutes without reporting discomfort in order to demonstrate significant improvements in household ADLs and return to PLOF.  Baseline: 04/02/2024: Pt reports sitting on the couch > 45 min; 04/30/2024: Pt reports able to sitting on couch > 45 min.  Goal status: GOAL MET   2.  Pt will decrease worst back pain by at least 2 points on the NPRS in order to demonstrate clinically significant reduction in back pain. Baseline: 03/12/2024: 10/10 NPS; 04/30/2024: 5/10 NPS  Goal status: GOAL MET  3.  Pt will decrease mODI score by at least 13 points in order demonstrate clinically significant reduction in back pain/disability.     Baseline: 03/18/2024: 31 / 50 = 62.0 %; 04/30/2024: 12 / 50 = 24.0 % Goal status: Progressing   4.  Pt will  increase L Hip MMT by 1 grade in order to demonstrate clinically significant improvements in LE strength.  Baseline: 03/12/2024 L Hip Flex: 3+/5, Abd: 3+/5; 04/30/2024: Hip Flex 4-/5, Abd 4-/5 Goal status: Progressing  5.  Pt will improve 5xSTS to 12 seconds or less to demonstrate clinically significant improvement in LE strength and ability to transfer pain free Baseline: 03/18/2024: 13.47s (7.72.6 sec); 04/30/2024: 9.17s Goal status: GOAL MET   PLAN: PT FREQUENCY: 1-2x/week  PT DURATION: 12 weeks  PLANNED INTERVENTIONS: Therapeutic exercises, Therapeutic activity, Neuromuscular re-education, Balance training, Gait training, Patient/Family education, Self Care, Joint mobilization, Joint manipulationOrthotic/Fit training, DME instructions, Dry Needling, Electrical stimulation, Spinal manipulation, Spinal mobilization, Cryotherapy, Moist heat, Taping, Traction, Ultrasound, Manual therapy, and Re-evaluation.  PLAN FOR NEXT SESSION: Progress Farmers carry, L gluteal strengthening, hamstring lengthening, sciatic nerve glides Progress Hip Strength and core stabilization, manual techniques PRN    Satira Curet PT, DPT 05/20/2024, 2:38 PM

## 2024-05-20 NOTE — Telephone Encounter (Signed)
 Duplicate reflled today

## 2024-05-20 NOTE — Addendum Note (Signed)
 Addended by: Raina Bunting on: 05/20/2024 09:38 AM   Modules accepted: Orders

## 2024-05-20 NOTE — Telephone Encounter (Signed)
 Please notify her, she should be able to pick up medicine today. I have sent refill of Oxycodone  to her pharmacy HT  Domingo Friend, DO [facility] 05/20/2024, 9:38 AM

## 2024-05-20 NOTE — Telephone Encounter (Signed)
 Copied from CRM [id number]. Topic: Clinical - Medication Refill >> May 20, 2024  8:15 AM Sasha H wrote: Medication: oxyCODONE -acetaminophen  (PERCOCET) 10-325 MG tablet  Has the patient contacted their pharmacy? Yes (Agent: If no, request that the patient contact the pharmacy for the refill. If patient does not wish to contact the pharmacy document the reason why and proceed with request.) (Agent: If yes, when and what did the pharmacy advise?)  This is the patient's preferred pharmacy:  [facility] [id number] [place], [place] - [address] [place] [address] Phone: [phone] Fax: [phone]   Is this the correct pharmacy for this prescription? Yes If no, delete pharmacy and type the correct one.   Has the prescription been filled recently? Yes  Is the patient out of the medication? Yes  Has the patient been seen for an appointment in the last year OR does the patient have an upcoming appointment? Yes  Can we respond through MyChart? Yes  Agent: Please be advised that Rx refills may take up to 3 business days. We ask that you follow-up with your pharmacy.

## 2024-05-20 NOTE — Telephone Encounter (Signed)
 Copied from CRM [id number]. Topic: Clinical - Medication Question >> May 20, 2024  8:17 AM Sasha H wrote: Reason for CRM: Pt is wanting to know if Dr.K can fill her oxyCODONE -acetaminophen  (PERCOCET) 10-325 MG tablet as soon as possible, as she is going out of town for her birthday and will be running out of meds today. I did advise pt that refill request can take up to 3 days.

## 2024-05-22 ENCOUNTER — Encounter

## 2024-05-23 ENCOUNTER — Encounter

## 2024-05-27 ENCOUNTER — Ambulatory Visit

## 2024-05-28 ENCOUNTER — Other Ambulatory Visit: Payer: Self-pay

## 2024-05-28 NOTE — Patient Outreach (Signed)
 05/28/2024 Name: Guillermo Difrancesco MRN: [id number] DOB: [birth year]   Connected with Ms. Dacquisto at scheduled appointment time today. Ms. Tarr stated "I'm doing fine and I'm not answering questions" Informed of pending Physical Therapy appointment on May 30, 2024, pending labs on July 25, 2024 and pending PCP visit on August 01, 2024. Stated she is aware of these appointments. Advised of case closure as she feels that she is doing well and will not answer questions required for Complex Care Management outreach.   Ms. Matson then reported contacting the clinic for a refill of pain medication prior to taking a trip for her birthday. Reports notifying the clinic member taking the call that she did not want to run out of pain medication. Reports being upset that the message was not relayed to her PCP.   Reviewed chart. Informed Ms. Petrich the message was relayed as requested on May 20, 2024 and her PCP ordered the pain medication the same day. She was informed that the pain medication was ordered for pick up at [facility] pharmacy. Ms. Roussin initially could not recall obtaining this prescription. She then reported requesting that two refills be placed on June 16th. She reports returning from her trip but unable to recall when she ran out of pain medication or if the clinic was contacted since the previous request. Per chart review, no additional requests were made since the outreach on June 16th.   Reviewed dispense report. Ms Dubiel informed that the dispense report indicated 30 tablets of oxycodone  was filled and dispensed on May 20, 2024. She reports being aware that her PCP is out of office until tomorrow. Stated she will just deal with the pain. RNCM will contact [facility] to confirm accuracy of dispense report and message PCP regarding the request for oxycodone  if a new order is required.  Addendum/Update: Per [facility] Pharmacist, the dispense report is correct. Pharmacist  reports Ms. Bottger obtained 30 tablets of oxycodone  on May 20, 2024. Indicated a new order will be required if she is requesting additional pain medication. Message relayed to PCP.   PLAN No further outreach required: Patient will not answer questions required for Complex Care Management outreach. Case Closure complete    Jackson Acron [facility] RN Care Manager Direct Dial: [phone]  Fax: [phone] Website: [email or web address]

## 2024-05-29 ENCOUNTER — Other Ambulatory Visit: Payer: Self-pay | Admitting: Family Medicine

## 2024-05-29 ENCOUNTER — Telehealth: Payer: Self-pay | Admitting: Family Medicine

## 2024-05-29 DIAGNOSIS — M5416 Radiculopathy, lumbar region: Secondary | ICD-10-CM

## 2024-05-29 DIAGNOSIS — G8929 Other chronic pain: Secondary | ICD-10-CM

## 2024-05-29 MED ORDER — OXYCODONE-ACETAMINOPHEN 10-325 MG PO TABS: 1.0000 | ORAL_TABLET | ORAL | 0 refills | Status: DC | PRN

## 2024-05-29 NOTE — Telephone Encounter (Signed)
 Tried calling patient, phone call failed.

## 2024-05-29 NOTE — Telephone Encounter (Signed)
 Please contact patient to notify her that the Oxycodone  rx has been sent to her pharmacy today.  Marsa Officer, DO [facility] 05/29/2024, 12:48 PM

## 2024-05-30 ENCOUNTER — Ambulatory Visit

## 2024-05-30 DIAGNOSIS — M5459 Other low back pain: Secondary | ICD-10-CM | POA: Diagnosis not present

## 2024-05-30 DIAGNOSIS — M6281 Muscle weakness (generalized): Secondary | ICD-10-CM | POA: Diagnosis not present

## 2024-05-30 DIAGNOSIS — R2689 Other abnormalities of gait and mobility: Secondary | ICD-10-CM | POA: Diagnosis not present

## 2024-05-30 NOTE — Therapy (Signed)
 OUTPATIENT PHYSICAL THERAPY THORACOLUMBAR TREATMENT/DISCHARGE SUMMARY     Patient Name: Cheryl Payne MRN: [id number] DOB:[birth year], 58 y.o., female Today's Date: 05/30/2024  END OF SESSION:  PT End of Session - 05/30/24 1348     Visit Number 16    Number of Visits 25    Date for PT Re-Evaluation 06/04/24    Progress Note Due on Visit 10    PT Start Time 1347    PT Stop Time 1430    PT Time Calculation (min) 43 min    Activity Tolerance Patient tolerated treatment well    Behavior During Therapy [facility] for tasks assessed/performed           Past Medical History:  Diagnosis Date   Anxiety NA   Arthritis    Blood transfusion without reported diagnosis 2023   When i was in the hospital   Cancer [facility])    lung   Chronic left shoulder pain    Depression    GERD (gastroesophageal reflux disease)    Hypertension    Ulcer 2023   When i was in the hospital   Past Surgical History:  Procedure Laterality Date   COLONOSCOPY WITH PROPOFOL  N/A 03/24/2023   Procedure: COLONOSCOPY WITH PROPOFOL ;  Surgeon: Therisa Bi, MD;  Location: [facility] ENDOSCOPY;  Service: Gastroenterology;  Laterality: N/A;   ESOPHAGOGASTRODUODENOSCOPY (EGD) WITH PROPOFOL  N/A 08/17/2022   Procedure: ESOPHAGOGASTRODUODENOSCOPY (EGD) WITH PROPOFOL ;  Surgeon: Therisa Bi, MD;  Location: [facility] ENDOSCOPY;  Service: Gastroenterology;  Laterality: N/A;   ESOPHAGOGASTRODUODENOSCOPY (EGD) WITH PROPOFOL  N/A 03/24/2023   Procedure: ESOPHAGOGASTRODUODENOSCOPY (EGD) WITH PROPOFOL ;  Surgeon: Therisa Bi, MD;  Location: [facility] ENDOSCOPY;  Service: Gastroenterology;  Laterality: N/A;   LUNG SURGERY     Patient Active Problem List   Diagnosis Date Noted   Blood on toilet paper 03/24/2023   Adenomatous polyp of colon 03/24/2023   Gastric ulcer 03/24/2023   GI bleed 08/17/2022   Upper GI bleed 08/16/2022   Leukocytosis 08/16/2022   Chronic left-sided low back pain with left-sided sciatica 09/21/2021   Pure  hypercholesterolemia 07/13/2020   Major depressive disorder, recurrent, in partial remission (HCC) 07/13/2020   GAD (generalized anxiety disorder) 07/13/2020   Encounter for screening mammogram for malignant neoplasm of breast 07/13/2020   Muscle spasm of left shoulder 07/13/2020   Chronic left shoulder pain 07/13/2020   Centrilobular emphysema (HCC) 07/13/2020   Moderate aortic valve insufficiency 07/13/2020   Obesity (BMI 30.0-34.9) 07/13/2020   DOE (dyspnea on exertion) 01/10/2020   Non-small cell carcinoma of lung (HCC) 01/02/2019   Cancer of upper lobe of right lung (HCC) 11/15/2018   Lung mass 10/02/2018   Essential hypertension 09/02/2014    PCP: Edman Marsa PARAS, DO  REFERRING PROVIDER: Ulis Bottcher, PA-C  REFERRING DIAG:  M54.16 (ICD-10-CM) - Lumbar radiculopathy    RATIONALE FOR EVALUATION AND TREATMENT: Rehabilitation  THERAPY DIAG: Muscle weakness (generalized)  Other low back pain  Other abnormalities of gait and mobility  ONSET DATE: Chronic (2019)  FOLLOW-UP APPT SCHEDULED WITH REFERRING PROVIDER: Yes    SUBJECTIVE:  SUBJECTIVE STATEMENT:  Chronic Low Back Pain   PERTINENT HISTORY:   Patient reports that she has had lower back pain since 2019. She reports that she has seen her provider and received multiple steroid injections with little relief.  Patient reports that pain starts in the lower back and refers down the posterior side of the L leg and ends the toes. Numbness and tingling are present in the posterior thigh and plantar aspect of the left foot. No relief from recent prescription (oxycodone , tremedol, hydrocodone ). Patient states that the pain intensifies with prolonged walking, sitting or standing. She reports that she spends most of the day on the floor or  lying down due to the radiating pain. She reports that pain in the L leg that disrupts her sleep throughout the night. Denies Saddle Anaesthesia, loss b/b, fever/chills.   Imaging Per Chart Review (02/26/2024):   EXAM: LUMBAR SPINE - COMPLETE 4+ VIEW   COMPARISON:  MRI lumbar spine January 18, 2024   FINDINGS: Minimal anterior osteophytic changes involving the L3-4 disc space. There is preservation of the height of the lumbar vertebral bodies and intervertebral disc spaces   No spondylolysis or listhesis   IMPRESSION: Minimal degenerative changes  PAIN:    Pain Intensity: Present: 9/10, Best: 5/10, Worst: 10/10 Pain location: lower back, Left posterior thigh and feet  Pain Quality: sharp and stabbing  Radiating: Yes  Numbness/Tingling: Yes Focal Weakness: No Aggravating factors: Prolonged walking, standing, sitting  Relieving factors: Rest, lying on back,  How long can you sit: < 10 min  How long can you stand: < 10 min  History of prior back injury, pain, surgery, or therapy: Yes Dominant hand: right  PRECAUTIONS: Fall  WEIGHT BEARING RESTRICTIONS: No  FALLS: Has patient fallen in last 6 months? No  Living Environment Lives with: Alone  Lives in: House/apartment Stairs: No Has following equipment at home: None  Prior level of function: Independent  Occupational demands: Retired   Presenter, broadcasting: Engineer, water, Cooking   Patient Goals: Relieve Pain and return PLOF    OBJECTIVE:  Patient Surveys    Cognition Patient is oriented to person, place, and time.  Attention span and concentration are intact.  Expressive speech is intact.     Gross Musculoskeletal Assessment Tremor: None Bulk: Normal Tone: Normal No visible step-off along spinal column, no signs of scoliosis  GAIT: Distance walked: 18m Assistive device utilized: None Level of assistance: Complete Independence Comments: Antalgic, Narrow Step Width, bilateral knee valgus  present  Posture: Lumbar lordosis: WNL Iliac crest height: Equal bilaterally Lumbar lateral shift: Negative  AROM AROM (Normal range in degrees) AROM   Lumbar   Flexion (65) 85% limited due to hamstring tightness  Extension (30) 100%  Right lateral flexion (25) 100%  Left lateral flexion (25) 100%  Right rotation (30) 100%  Left rotation (30) 100%      Hip Right Left  Flexion (125) WNL WNL  Extension (15)    Abduction (40)    Adduction     Internal Rotation (45) 30 30  External Rotation (45) 30 20*      Knee    Flexion (135) WNL WNL  Extension (0) WNL WNL      Ankle    Dorsiflexion (20)    Plantarflexion (50)    Inversion (35)    Eversion (15)    (* = pain; Blank rows = not tested)  LE MMT: MMT (out of 5) Right  Left   Hip flexion 3+ 3+  Hip extension    Hip abduction 4 3+  Hip adduction    Hip internal rotation    Hip external rotation    Knee flexion 4+ 4-  Knee extension 4+ 4-  Ankle dorsiflexion    Ankle plantarflexion    Ankle inversion    Ankle eversion    (* = pain; Blank rows = not tested)  Sensation Grossly intact to light touch throughout bilateral LEs as determined by testing dermatomes.   Intermittent sharp pain noted in R LE  in L5-S1 dermatomal pattern.  Reflexes Not Test  Muscle Length Hamstrings: R: Negative 20 L: Positive: 40 with reproduction of pain  Palpation Location Right Left         Lumbar paraspinals 0 0  Quadratus Lumborum 0 0  Gluteus Maximus 1 1  Gluteus Medius 1 1  Deep hip external rotators 1 2  PSIS    Fortin's Area (SIJ)    Greater Trochanter 1 2  (Blank rows = not tested) Graded on 0-4 scale (0 = no pain, 1 = pain, 2 = pain with wincing/grimacing/flinching, 3 = pain with withdrawal, 4 = unwilling to allow palpation)  Passive Accessory Intervertebral Motion Pt reports reproduction of back pain with CPA L4-S1 segments with no improvements following prolonged CPA.   Special Tests Lumbar Radiculopathy  and Discogenic: Centralization and Peripheralization (SN 92, -LR 0.12): Not Tested  Slump (SN 83, -LR 0.32): Not Tested SLR (SN 92, -LR 0.29): Not Tested  Hip: FABER (SN 81): R: Negative L: Negative FADIR (SN 94): R: Negative L: Negative  Functional Tasks  Deep squat: Narrow BOS, Hip hinge absent, decreased ankle dorsiflexion   Sit to stand: Use of BUE support, minor knee valgus present  Forward Step-Down Test: R: Pain with Knee Valgus present   TODAY'S TREATMENT: DATE: 05/30/2024   Subjective: Patient reports exacerbation of pain in R lower back but resolved with HEP stretches and core stabilization. Pt reports no muscle spasms or shooting pain in L posterior thigh. She reports that she is able to walk farther distances around her apt complex without pain during or after bout. Pt still agreeable to d/c.  No question or concerns  Therapeutic Exercise:   NuStep L5-1 x 5 min x UE/LE (Seat 10) for LE warm up, endurance and strength; PT manually adjusted resistance throughout bout.    Dead Bug   1 x 10, Alternating LE    Supine Double Leg Lift    2 x 5s  Time spent reviewing updated HEP and gym equipment added in order to maintain progress. Educated patient on intensity, frequency and repetitions in order to refrain from re-injury.     Therapeutic Activity:   Horizontal Chops with Tidal Tank for external perturbation and core control   2 x 6 R Upper to L lower   Tall Marches with Tidal Tank  2 x 10 Alternating marches  Kettle Bell Squat  3 x 8, 30#    Kettle Bell Swing   1 x 10 10# KB    1 x 10 20# KB    PATIENT EDUCATION:  Education details: HEP, Exercises  Person educated: Patient Education method: Explanation, Actor cues, Verbal cues, and Handouts Education comprehension: verbalized understanding, returned demonstration, and needs further education   HOME EXERCISE PROGRAM:  Access Code: [email or web address] URL: [email or web address] Date:  05/08/2024 Prepared by: Lonni Pall  Exercises - Supine Dead Bug with Leg Extension  - 1 x daily - 3-4 x weekly - 2-3 sets - 10  reps - Supine Bridge with Resistance Band  - 1 x daily - 3-4 x weekly - 2-3 sets - 10 reps - Side Stepping with Resistance at Ankles  - 1 x daily - 3-4 x weekly - 2-3 sets - 10 reps - Single Leg Balance Raising Opposite Arm and Leg  - 1 x daily - 7 x weekly - 2-3 sets - 10 reps - Supine Gluteus Stretch  - 1 x daily - 7 x weekly - 3 sets - 30s hold - Supine Piriformis Stretch with Foot on Ground  - 1 x daily - 7 x weekly - 3 sets - 30s hold - Supine Figure 4 Piriformis Stretch  - 1 x daily - 7 x weekly - 3 sets - 30s hold - Supine Sciatic Nerve Glide  - 1 x daily - 7 x weekly - 3 sets - 30s hold - Back and Glute Stretch With Ball  - 1 x daily - 7 x weekly - 3 sets - 30s hold  Access Code: [email or web address] URL: [email or web address] Date: 04/23/2024 Prepared by: Lonni Shealee Yordy  Exercises - Supine Figure 4 Piriformis Stretch  - 1 x daily - 7 x weekly - 3 sets - 30-60 hold - Supine Single Knee to Chest Stretch  - 1 x daily - 7 x weekly - 3 sets - 30-60 hold - Seated Hamstring Stretch  - 1 x daily - 7 x weekly - 3 sets - 30-60 hold - Supine Sciatic Nerve Glide  - 1 x daily - 7 x weekly - 1 sets - 10-12 reps - Bridge with Hip Abduction and Resistance  - 1 x daily - 3-4 x weekly - 2-3 sets - 10-12 reps - Clam with Resistance  - 1 x daily - 3-4 x weekly - 2-3 sets - 10-12 reps - Bird Dog  - 1 x daily - 3-4 x weekly - 2-3 sets - 10-12 reps - Supine Dead Bug with Leg Extension  - 1 x daily - 3-4 x weekly - 2-3 sets - 10-12 reps - Side Plank on Knees  - 1 x daily - 3-4 x weekly - 2 sets - 3 reps - 10s hold   ASSESSMENT:  CLINICAL IMPRESSION: Cheryl Payne is a 58 y.o. female seen for lower back pain. Pt reached end of POC and is agreeable to discharge to home with HEP. Pt has met all remaining goals and demonstrates improvements in functional strength,  core stability and balance. The patient has achieved independence with daily activities and is able to perform exercises with proper technique. She demonstrated good ability to maintain core stability while performing dynamic movements, against external perturbation, and prolonged walking. Patient endorses significant progress towards with lower back pain disability and states she utilizes less pain medication in order to perform ADLs. They no longer require skilled physical therapy interventions and are discharged with a home exercise program to maintain progress. No question at end of session. Follow-up with their primary care provider is recommended if any issues arise.   OBJECTIVE IMPAIRMENTS: Abnormal gait, decreased activity tolerance, difficulty walking, decreased ROM, decreased strength, and pain.   ACTIVITY LIMITATIONS: carrying, bending, sitting, standing, squatting, and sleeping  PARTICIPATION LIMITATIONS: driving, shopping, and community activity  PERSONAL FACTORS: Age, Social background, and Time since onset of injury/illness/exacerbation are also affecting patient's functional outcome.   REHAB POTENTIAL: Good  CLINICAL DECISION MAKING: Evolving/moderate complexity  EVALUATION COMPLEXITY: Moderate   GOALS: Goals reviewed with patient? No  SHORT TERM GOALS: Target date:  07/11/2024  Pt will be independent with HEP in order to improve strength and decrease back pain to improve pain-free function at home and work. Baseline: Pt endorses using Mychart and HEP  exercises 2-3x/wk  Goal status: MET   LONG TERM GOALS: Target date: 08/22/2024  Pt will be able to sit for 30-60 minutes without reporting discomfort in order to demonstrate significant improvements in household ADLs and return to PLOF.  Baseline: 04/02/2024: Pt reports sitting on the couch > 45 min; 04/30/2024: Pt reports able to sitting on couch > 45 min.  Goal status: GOAL MET   2.  Pt will decrease worst back pain by  at least 2 points on the NPRS in order to demonstrate clinically significant reduction in back pain. Baseline: 03/12/2024: 10/10 NPS; 04/30/2024: 5/10 NPS; 05/30/2024: 0/10 (LBP), 3-4 Post. L Thigh Goal status: GOAL MET  3.  Pt will decrease mODI score by at least 13 points in order demonstrate clinically significant reduction in back pain/disability.     Baseline: 03/18/2024: 31 / 50 = 62.0 %; 04/30/2024: 12 / 50 = 24.0 %;  Goal status: Progressing   4.  Pt will increase L Hip MMT by 1 grade in order to demonstrate clinically significant improvements in LE strength.  Baseline: 03/12/2024 L Hip Flex: 3+/5, Abd: 3+/5; 04/30/2024: Hip Flex 4-/5, Abd 4-/5 Goal status: Progressing  5.  Pt will improve 5xSTS to 12 seconds or less to demonstrate clinically significant improvement in LE strength and ability to transfer pain free Baseline: 03/18/2024: 13.47s (7.72.6 sec); 04/30/2024: 9.17s Goal status: GOAL MET   PLAN: PT FREQUENCY: 1-2x/week  PT DURATION: 12 weeks  PLANNED INTERVENTIONS: Therapeutic exercises, Therapeutic activity, Neuromuscular re-education, Balance training, Gait training, Patient/Family education, Self Care, Joint mobilization, Joint manipulationOrthotic/Fit training, DME instructions, Dry Needling, Electrical stimulation, Spinal manipulation, Spinal mobilization, Cryotherapy, Moist heat, Taping, Traction, Ultrasound, Manual therapy, and Re-evaluation.  PLAN FOR NEXT SESSION: Discharge  Lonni Terrianna Holsclaw PT, DPT 05/30/2024, 1:49 PM

## 2024-06-01 ENCOUNTER — Other Ambulatory Visit: Payer: Self-pay | Admitting: Family Medicine

## 2024-06-01 DIAGNOSIS — I10 Essential (primary) hypertension: Secondary | ICD-10-CM

## 2024-06-03 ENCOUNTER — Other Ambulatory Visit: Payer: Self-pay

## 2024-06-03 DIAGNOSIS — I10 Essential (primary) hypertension: Secondary | ICD-10-CM

## 2024-06-03 MED ORDER — AMLODIPINE BESYLATE 5 MG PO TABS: 5.0000 mg | ORAL_TABLET | Freq: Every day | ORAL | 1 refills | Status: DC

## 2024-06-03 NOTE — Telephone Encounter (Signed)
 Duplicate request, filled 06/03/24 #90 1RF Requested Prescriptions  Pending Prescriptions Disp Refills   amLODipine  (NORVASC ) 5 MG tablet [Pharmacy Med Name: amLODIPine  BESYLATE 5 MG TAB] 90 tablet 1    Sig: TAKE 1 TABLET BY MOUTH DAILY     Cardiovascular: Calcium  Channel Blockers 2 Passed - 06/03/2024  3:27 PM      Passed - Last BP in normal range    BP Readings from Last 1 Encounters:  02/26/24 106/62         Passed - Last Heart Rate in normal range    Pulse Readings from Last 1 Encounters:  02/18/24 100         Passed - Valid encounter within last 6 months    Recent Outpatient Visits           4 months ago Pre-diabetes   [facility] [place], Marsa PARAS, DO       Future Appointments             In 1 month Edman, Marsa PARAS, DO [facility], [facility]

## 2024-06-06 ENCOUNTER — Other Ambulatory Visit: Payer: Self-pay | Admitting: Family Medicine

## 2024-06-06 DIAGNOSIS — G8929 Other chronic pain: Secondary | ICD-10-CM

## 2024-06-06 DIAGNOSIS — M5416 Radiculopathy, lumbar region: Secondary | ICD-10-CM

## 2024-06-06 MED ORDER — OXYCODONE-ACETAMINOPHEN 10-325 MG PO TABS: 1.0000 | ORAL_TABLET | ORAL | 0 refills | Status: DC | PRN

## 2024-06-17 ENCOUNTER — Other Ambulatory Visit: Payer: Self-pay | Admitting: Family Medicine

## 2024-06-17 DIAGNOSIS — G8929 Other chronic pain: Secondary | ICD-10-CM

## 2024-06-17 DIAGNOSIS — M5416 Radiculopathy, lumbar region: Secondary | ICD-10-CM

## 2024-06-17 MED ORDER — OXYCODONE-ACETAMINOPHEN 10-325 MG PO TABS: 1.0000 | ORAL_TABLET | ORAL | 0 refills | Status: DC | PRN

## 2024-07-05 ENCOUNTER — Other Ambulatory Visit: Payer: Self-pay | Admitting: Family Medicine

## 2024-07-05 DIAGNOSIS — G8929 Other chronic pain: Secondary | ICD-10-CM

## 2024-07-05 DIAGNOSIS — M5416 Radiculopathy, lumbar region: Secondary | ICD-10-CM

## 2024-07-05 MED ORDER — OXYCODONE-ACETAMINOPHEN 10-325 MG PO TABS
1.0000 | ORAL_TABLET | ORAL | 0 refills | Status: DC | PRN
Start: 2024-07-05 — End: 2024-08-02

## 2024-07-25 ENCOUNTER — Other Ambulatory Visit: Payer: 59

## 2024-07-25 DIAGNOSIS — E7800 Pure hypercholesterolemia, unspecified: Secondary | ICD-10-CM | POA: Diagnosis not present

## 2024-07-25 DIAGNOSIS — J432 Centrilobular emphysema: Secondary | ICD-10-CM

## 2024-07-25 DIAGNOSIS — Z0000 Encounter for general adult medical examination without abnormal findings: Secondary | ICD-10-CM

## 2024-07-25 DIAGNOSIS — I10 Essential (primary) hypertension: Secondary | ICD-10-CM

## 2024-07-25 DIAGNOSIS — R7303 Prediabetes: Secondary | ICD-10-CM

## 2024-07-26 LAB — COMPLETE METABOLIC PANEL WITHOUT GFR
AG Ratio: 1.7 (calc) (ref 1.0–2.5)
ALT: 8 U/L (ref 6–29)
AST: 10 U/L (ref 10–35)
Albumin: 4.3 g/dL (ref 3.6–5.1)
Alkaline phosphatase (APISO): 82 U/L (ref 37–153)
BUN: 11 mg/dL (ref 7–25)
CO2: 28 mmol/L (ref 20–32)
Calcium: 9.6 mg/dL (ref 8.6–10.4)
Chloride: 104 mmol/L (ref 98–110)
Creat: 0.67 mg/dL (ref 0.50–1.03)
Globulin: 2.5 g/dL (ref 1.9–3.7)
Glucose, Bld: 105 mg/dL — ABNORMAL HIGH (ref 65–99)
Potassium: 4.9 mmol/L (ref 3.5–5.3)
Sodium: 141 mmol/L (ref 135–146)
Total Bilirubin: 0.4 mg/dL (ref 0.2–1.2)
Total Protein: 6.8 g/dL (ref 6.1–8.1)

## 2024-07-26 LAB — CBC WITH DIFFERENTIAL/PLATELET
Absolute Lymphocytes: 2662 {cells}/uL (ref 850–3900)
Absolute Monocytes: 572 {cells}/uL (ref 200–950)
Basophils Absolute: 50 {cells}/uL (ref 0–200)
Basophils Relative: 0.9 %
Eosinophils Absolute: 83 {cells}/uL (ref 15–500)
Eosinophils Relative: 1.5 %
HCT: 42 % (ref 35.0–45.0)
Hemoglobin: 13.7 g/dL (ref 11.7–15.5)
MCH: 32.1 pg (ref 27.0–33.0)
MCHC: 32.6 g/dL (ref 32.0–36.0)
MCV: 98.4 fL (ref 80.0–100.0)
MPV: 11.7 fL (ref 7.5–12.5)
Monocytes Relative: 10.4 %
Neutro Abs: 2134 {cells}/uL (ref 1500–7800)
Neutrophils Relative %: 38.8 %
Platelets: 241 Thousand/uL (ref 140–400)
RBC: 4.27 Million/uL (ref 3.80–5.10)
RDW: 15.8 % — ABNORMAL HIGH (ref 11.0–15.0)
Total Lymphocyte: 48.4 %
WBC: 5.5 Thousand/uL (ref 3.8–10.8)

## 2024-07-26 LAB — HEMOGLOBIN A1C
Hgb A1c MFr Bld: 5.9 % — ABNORMAL HIGH (ref <5.7)
Mean Plasma Glucose: 123 mg/dL
eAG (mmol/L): 6.8 mmol/L

## 2024-07-26 LAB — LIPID PANEL
Cholesterol: 217 mg/dL — ABNORMAL HIGH (ref <200)
HDL: 72 mg/dL (ref >=50)
LDL Cholesterol (Calc): 125 mg/dL — ABNORMAL HIGH
Non-HDL Cholesterol (Calc): 145 mg/dL — ABNORMAL HIGH (ref <130)
Total CHOL/HDL Ratio: 3 (calc) (ref <5.0)
Triglycerides: 92 mg/dL (ref <150)

## 2024-07-26 LAB — TSH: TSH: 1.71 m[IU]/L (ref 0.40–4.50)

## 2024-08-01 ENCOUNTER — Telehealth: Payer: Self-pay

## 2024-08-01 ENCOUNTER — Ambulatory Visit: Payer: 59

## 2024-08-01 ENCOUNTER — Ambulatory Visit: Payer: 59 | Admitting: Family Medicine

## 2024-08-01 NOTE — Telephone Encounter (Signed)
 Unsuccessful attempts to reach patient on preferred number listed in notes for scheduled AWV. Left message on voicemail okay to reschedule.

## 2024-08-02 ENCOUNTER — Other Ambulatory Visit: Payer: Self-pay | Admitting: Family Medicine

## 2024-08-02 DIAGNOSIS — M5416 Radiculopathy, lumbar region: Secondary | ICD-10-CM

## 2024-08-02 DIAGNOSIS — G8929 Other chronic pain: Secondary | ICD-10-CM

## 2024-08-02 MED ORDER — OXYCODONE-ACETAMINOPHEN 10-325 MG PO TABS: 1.0000 | ORAL_TABLET | ORAL | 0 refills | Status: DC | PRN

## 2024-08-08 ENCOUNTER — Encounter: Payer: Self-pay | Admitting: Family Medicine

## 2024-08-08 ENCOUNTER — Ambulatory Visit (INDEPENDENT_AMBULATORY_CARE_PROVIDER_SITE_OTHER): Admitting: Family Medicine

## 2024-08-08 DIAGNOSIS — G8929 Other chronic pain: Secondary | ICD-10-CM

## 2024-08-08 DIAGNOSIS — M5442 Lumbago with sciatica, left side: Secondary | ICD-10-CM

## 2024-08-08 DIAGNOSIS — M5416 Radiculopathy, lumbar region: Secondary | ICD-10-CM | POA: Diagnosis not present

## 2024-08-08 NOTE — Progress Notes (Signed)
 Subjective:    Patient ID: Cheryl Payne, female    DOB: 10-26-1966, 58 y.o.   MRN: [id number]  Cheryl Payne is a 58 y.o. female presenting on 08/08/2024 for Annual Exam   HPI  Discussed the use of AI scribe software for clinical note transcription with the patient, who gave verbal consent to proceed.  History of Present Illness   Cheryl Payne is a 58 year old female who presents for an annual physical exam.  Immunization status - Received influenza and pneumococcal vaccines on August 04, 2024 at pharmacy  Dyslipidemia and statin-associated myalgia - Currently taking rosuvastatin  5 mg every other day for cholesterol management - Severe leg cramps, particularly at night, suspected to be related to rosuvastatin  - No specific medication taken for leg cramps  Prediabetes - Blood sugar level 5.9, consistent with mild prediabetes - Considering dietary modifications for glycemic control  Resolved anemia - History of anemia, currently resolved - Blood counts within normal range       Chronic Low Back Pain Lumbar Radiculitis Followed by Dr Avanell [facility] She has received ESI injection x 2 without significant results On previous visit 01/10/24, she was given oral prednisone  taper 6 days duration through 01/16/24 with good results while on medicine but then pain resumed off of medication Describes Left lower extremity radiating pain into foot/heel, also with reduced sensation in feet Also given Tramadol earlier in 12/2023 and it was ineffective, she was next given Hydrocodone  5/325 short course and takes at night with relief She continues  - She had Lumbar MRI completed 01/18/24, pending result and follow-up -She has been more active with new job  She is resuming home PT now and restarted at gym. She follows low back exercise regimen. She has graduated PT - Recently started gym exercise regimen Reduced Gabapentin  300mg  x 2 = 600mg  nightly On Oxycodone  AS NEEDED  reducing now    Generalized Anxiety Disorder Major Depression, chronic recurrent mild - partial remission Chronic history 10+ years in past, has had primarily issues with generalized anxiety disorder Improved on Escitalopram  20mg  daily + Buspar  PRN   Pre-Diabetes A1c 5.9, prior 5.6 to 5.7 Goal to improve diet   Hypertension HLD - Currently taking Rosuvastatin  5mg  every other day tolerating well without side effects or myalgias - Taking Amlodipine  5mg  daiily   Health Maintenance:  Next pap smear 2026  Mammogram 01/19/24 negative  Colonoscopy 03/24/23 Dr Therisa, repeat 3 years. 2027  Past Surgical History:  Procedure Laterality Date   COLONOSCOPY WITH PROPOFOL  N/A 03/24/2023   Procedure: COLONOSCOPY WITH PROPOFOL ;  Surgeon: Therisa Bi, MD;  Location: [facility] ENDOSCOPY;  Service: Gastroenterology;  Laterality: N/A;   ESOPHAGOGASTRODUODENOSCOPY (EGD) WITH PROPOFOL  N/A 08/17/2022   Procedure: ESOPHAGOGASTRODUODENOSCOPY (EGD) WITH PROPOFOL ;  Surgeon: Therisa Bi, MD;  Location: [facility] ENDOSCOPY;  Service: Gastroenterology;  Laterality: N/A;   ESOPHAGOGASTRODUODENOSCOPY (EGD) WITH PROPOFOL  N/A 03/24/2023   Procedure: ESOPHAGOGASTRODUODENOSCOPY (EGD) WITH PROPOFOL ;  Surgeon: Therisa Bi, MD;  Location: [facility] ENDOSCOPY;  Service: Gastroenterology;  Laterality: N/A;   LUNG SURGERY          08/08/2024    9:24 AM 04/30/2024    4:15 PM 03/13/2024    5:49 PM  Depression screen PHQ 2/9  Decreased Interest 1 0 1  Down, Depressed, Hopeless 1 0 1  PHQ - 2 Score 2 0 2  Altered sleeping 2  1  Tired, decreased energy 1  1  Change in appetite 0  0  Feeling bad or failure about  yourself  0  0  Trouble concentrating 0  0  Moving slowly or fidgety/restless 0    Suicidal thoughts 0  0  PHQ-9 Score 5  4  Difficult doing work/chores Somewhat difficult  Very difficult       08/08/2024    9:24 AM 03/13/2024    7:09 PM 01/26/2024    8:09 AM 07/24/2023    8:04 AM  GAD 7 : Generalized Anxiety Score   Nervous, Anxious, on Edge 1 1 1 1   Control/stop worrying 1 1 2 1   Worry too much - different things 1 1 1 1   Trouble relaxing 1 1 1 1   Restless 1 1 1 1   Easily annoyed or irritable 2 0 2 2  Afraid - awful might happen 0 0 0 1  Total GAD 7 Score 7 5 8 8   Anxiety Difficulty Somewhat difficult Somewhat difficult Somewhat difficult Somewhat difficult     Past Medical History:  Diagnosis Date   Anxiety NA   Arthritis    Blood transfusion without reported diagnosis 2023   When i was in the hospital   Cancer [facility])    lung   Chronic left shoulder pain    Depression    GERD (gastroesophageal reflux disease)    Hypertension    Ulcer 2023   When i was in the hospital   Past Surgical History:  Procedure Laterality Date   COLONOSCOPY WITH PROPOFOL  N/A 03/24/2023   Procedure: COLONOSCOPY WITH PROPOFOL ;  Surgeon: Therisa Bi, MD;  Location: [facility] ENDOSCOPY;  Service: Gastroenterology;  Laterality: N/A;   ESOPHAGOGASTRODUODENOSCOPY (EGD) WITH PROPOFOL  N/A 08/17/2022   Procedure: ESOPHAGOGASTRODUODENOSCOPY (EGD) WITH PROPOFOL ;  Surgeon: Therisa Bi, MD;  Location: [facility] ENDOSCOPY;  Service: Gastroenterology;  Laterality: N/A;   ESOPHAGOGASTRODUODENOSCOPY (EGD) WITH PROPOFOL  N/A 03/24/2023   Procedure: ESOPHAGOGASTRODUODENOSCOPY (EGD) WITH PROPOFOL ;  Surgeon: Therisa Bi, MD;  Location: [facility] ENDOSCOPY;  Service: Gastroenterology;  Laterality: N/A;   LUNG SURGERY     Social History   Socioeconomic History   Marital status: Single    Spouse name: Not on file   Number of children: Not on file   Years of education: Not on file   Highest education level: 9th grade  Occupational History   Not on file  Tobacco Use   Smoking status: Former    Current packs/day: 0.00    Average packs/day: 1 pack/day for 35.0 years (35.0 ttl pk-yrs)    Types: Cigarettes    Quit date: 01/29/2019    Years since quitting: 5.5   Smokeless tobacco: Former    Quit date: 06/04/2018  Vaping Use   Vaping status:  Never Used  Substance and Sexual Activity   Alcohol use: Not Currently    Comment: Dont drink anymore   Drug use: Not Currently    Types: Marijuana   Sexual activity: Not Currently    Birth control/protection: None  Other Topics Concern   Not on file  Social History Narrative   Not on file   Social Drivers of Health   Financial Resource Strain: Low Risk  (08/06/2024)   Overall Financial Resource Strain (CARDIA)    Difficulty of Paying Living Expenses: Not hard at all  Food Insecurity: No Food Insecurity (08/06/2024)   Hunger Vital Sign    Worried About Running Out of Food in the Last Year: Never true    Ran Out of Food in the Last Year: Never true  Transportation Needs: No Transportation Needs (08/06/2024)   PRAPARE - Transportation  Lack of Transportation (Medical): No    Lack of Transportation (Non-Medical): No  Physical Activity: Insufficiently Active (08/06/2024)   Exercise Vital Sign    Days of Exercise per Week: 7 days    Minutes of Exercise per Session: 20 min  Stress: Stress Concern Present (08/06/2024)   [employer] of Occupational Health - Occupational Stress Questionnaire    Feeling of Stress: Very much  Social Connections: Moderately Integrated (08/06/2024)   Social Connection and Isolation Panel    Frequency of Communication with Friends and Family: More than three times a week    Frequency of Social Gatherings with Friends and Family: More than three times a week    Attends Religious Services: More than 4 times per year    Active Member of [employer] or Organizations: Yes    Attends Banker Meetings: More than 4 times per year    Marital Status: Never married  Intimate Partner Violence: Not At Risk (04/30/2024)   Humiliation, Afraid, Rape, and Kick questionnaire    Fear of Current or Ex-Partner: No    Emotionally Abused: No    Physically Abused: No    Sexually Abused: No   Family History  Problem Relation Age of Onset   Stomach cancer Mother 2    Breast cancer Mother    Cancer Mother    Hypertension Mother    Stroke Mother    Lung cancer Father 68   Current Outpatient Medications on File Prior to Visit  Medication Sig   amLODipine  (NORVASC ) 5 MG tablet Take 1 tablet (5 mg total) by mouth daily.   busPIRone  (BUSPAR ) 5 MG tablet TAKE 1 TABLET BY MOUTH 3 TIMES A DAY AS NEEDED FOR ANXIETY   escitalopram  (LEXAPRO ) 20 MG tablet TAKE 1 TABLET BY MOUTH AT BEDTIME   fluticasone (FLONASE) 50 MCG/ACT nasal spray Place 1 spray into both nostrils daily.   loratadine ([employer] ALLERGY RELIEF) 10 MG tablet Take 10 mg by mouth daily.   oxyCODONE -acetaminophen  (PERCOCET) 10-325 MG tablet Take 1 tablet by mouth every 4 (four) hours as needed for pain.   rosuvastatin  (CRESTOR ) 5 MG tablet Take 1 tablet (5 mg total) by mouth every other day.   triamcinolone  ointment (KENALOG ) 0.1 % Apply 1 Application topically 2 (two) times daily.   gabapentin  (NEURONTIN ) 300 MG capsule Take 2 capsules (600 mg total) by mouth at bedtime.   No current facility-administered medications on file prior to visit.    Review of Systems  Constitutional:  Negative for activity change, appetite change, chills, diaphoresis, fatigue and fever.  HENT:  Negative for congestion and hearing loss.   Eyes:  Negative for visual disturbance.  Respiratory:  Negative for cough, chest tightness, shortness of breath and wheezing.   Cardiovascular:  Negative for chest pain, palpitations and leg swelling.  Gastrointestinal:  Negative for abdominal pain, constipation, diarrhea, nausea and vomiting.  Genitourinary:  Negative for dysuria, frequency and hematuria.  Musculoskeletal:  Positive for back pain. Negative for arthralgias and neck pain.  Skin:  Negative for rash.  Neurological:  Negative for dizziness, weakness, light-headedness, numbness and headaches.  Hematological:  Negative for adenopathy.  Psychiatric/Behavioral:  Negative for behavioral problems, dysphoric mood and sleep  disturbance.    Per HPI unless specifically indicated above     Objective:    BP 124/70 (BP Location: Right Arm, Patient Position: Sitting, Cuff Size: Normal)   Pulse 87   Ht 5' 2 (1.575 m)   Wt 172 lb 4 oz (78.1 kg)  SpO2 96%   BMI 31.50 kg/m   Wt Readings from Last 3 Encounters:  08/08/24 172 lb 4 oz (78.1 kg)  02/26/24 159 lb (72.1 kg)  02/18/24 180 lb (81.6 kg)    Physical Exam Vitals and nursing note reviewed.  Constitutional:      General: She is not in acute distress.    Appearance: She is well-developed. She is not diaphoretic.     Comments: Well-appearing, comfortable, cooperative  HENT:     Head: Normocephalic and atraumatic.  Eyes:     General:        Right eye: No discharge.        Left eye: No discharge.     Conjunctiva/sclera: Conjunctivae normal.     Pupils: Pupils are equal, round, and reactive to light.  Neck:     Thyroid : No thyromegaly.  Cardiovascular:     Rate and Rhythm: Normal rate and regular rhythm.     Pulses: Normal pulses.     Heart sounds: Normal heart sounds. No murmur heard. Pulmonary:     Effort: Pulmonary effort is normal. No respiratory distress.     Breath sounds: Normal breath sounds. No wheezing or rales.  Abdominal:     General: Bowel sounds are normal. There is no distension.     Palpations: Abdomen is soft. There is no mass.     Tenderness: There is no abdominal tenderness.  Musculoskeletal:        General: No tenderness. Normal range of motion.     Cervical back: Normal range of motion and neck supple.     Right lower leg: No edema.     Left lower leg: No edema.     Comments: Upper / Lower Extremities: - Normal muscle tone, strength bilateral upper extremities 5/5, lower extremities 5/5  Lymphadenopathy:     Cervical: No cervical adenopathy.  Skin:    General: Skin is warm and dry.     Findings: No erythema or rash.  Neurological:     Mental Status: She is alert and oriented to person, place, and time.      Comments: Distal sensation intact to light touch all extremities  Psychiatric:        Mood and Affect: Mood normal.        Behavior: Behavior normal.        Thought Content: Thought content normal.     Comments: Well groomed, good eye contact, normal speech and thoughts     Results for orders placed or performed in visit on 07/25/24  CBC with Differential/Platelet   Collection Time: 07/25/24  8:07 AM  Result Value Ref Range   WBC 5.5 3.8 - 10.8 Thousand/uL   RBC 4.27 3.80 - 5.10 Million/uL   Hemoglobin 13.7 11.7 - 15.5 g/dL   HCT 57.9 64.9 - 54.9 %   MCV 98.4 80.0 - 100.0 fL   MCH 32.1 27.0 - 33.0 pg   MCHC 32.6 32.0 - 36.0 g/dL   RDW 84.1 (H) 88.9 - 84.9 %   Platelets 241 140 - 400 Thousand/uL   MPV 11.7 7.5 - 12.5 fL   Neutro Abs 2,134 1,500 - 7,800 cells/uL   Absolute Lymphocytes 2,662 850 - 3,900 cells/uL   Absolute Monocytes 572 200 - 950 cells/uL   Eosinophils Absolute 83 15 - 500 cells/uL   Basophils Absolute 50 0 - 200 cells/uL   Neutrophils Relative % 38.8 %   Total Lymphocyte 48.4 %   Monocytes Relative 10.4 %  Eosinophils Relative 1.5 %   Basophils Relative 0.9 %  COMPLETE METABOLIC PANEL WITH GFR   Collection Time: 07/25/24  8:07 AM  Result Value Ref Range   Glucose, Bld 105 (H) 65 - 99 mg/dL   BUN 11 7 - 25 mg/dL   Creat 9.32 9.49 - 8.96 mg/dL   BUN/Creatinine Ratio SEE NOTE: 6 - 22 (calc)   Sodium 141 135 - 146 mmol/L   Potassium 4.9 3.5 - 5.3 mmol/L   Chloride 104 98 - 110 mmol/L   CO2 28 20 - 32 mmol/L   Calcium  9.6 8.6 - 10.4 mg/dL   Total Protein 6.8 6.1 - 8.1 g/dL   Albumin 4.3 3.6 - 5.1 g/dL   Globulin 2.5 1.9 - 3.7 g/dL (calc)   AG Ratio 1.7 1.0 - 2.5 (calc)   Total Bilirubin 0.4 0.2 - 1.2 mg/dL   Alkaline phosphatase (APISO) 82 37 - 153 U/L   AST 10 10 - 35 U/L   ALT 8 6 - 29 U/L  Hemoglobin A1c   Collection Time: 07/25/24  8:07 AM  Result Value Ref Range   Hgb A1c MFr Bld 5.9 (H) <5.7 %   Mean Plasma Glucose 123 mg/dL   eAG (mmol/L)  6.8 mmol/L  Lipid panel   Collection Time: 07/25/24  8:07 AM  Result Value Ref Range   Cholesterol 217 (H) <200 mg/dL   HDL 72 > OR = 50 mg/dL   Triglycerides 92 <849 mg/dL   LDL Cholesterol (Calc) 125 (H) mg/dL (calc)   Total CHOL/HDL Ratio 3.0 <5.0 (calc)   Non-HDL Cholesterol (Calc) 145 (H) <130 mg/dL (calc)  TSH   Collection Time: 07/25/24  8:07 AM  Result Value Ref Range   TSH 1.71 0.40 - 4.50 mIU/L      Assessment & Plan:   Problem List Items Addressed This Visit     Chronic left-sided low back pain with left-sided sciatica   Relevant Medications   gabapentin  (NEURONTIN ) 300 MG capsule   Other Visit Diagnoses       Lumbar radiculitis       Relevant Medications   gabapentin  (NEURONTIN ) 300 MG capsule        Updated Health Maintenance information Reviewed recent lab results with patient Encouraged improvement to lifestyle with diet and exercise Goal of weight loss   Low back pain with left-sided sciatica and lumbar radiculopathy Chronic low back pain with left-sided sciatica managed with home physical therapy and gym exercises. - Continue home physical therapy and gym exercises as instructed.  Chronic pain requiring opioid therapy Chronic pain managed with oxycodone  as needed. Discussed potential for tapering dosage in the future. - Continue current oxycodone  regimen as needed. - Consider tapering dosage in the future if pain improves.  Hyperlipidemia with possible statin-induced muscle cramps LDL cholesterol is 125 mg/dL, slightly above target. Reports muscle cramping, possibly due to statin use. Discussed potential causes and alternative remedies. - Pause rosuvastatin  for 2 weeks to assess if muscle cramping resolves. - Consider using Hyland's leg cramp supplement or mustard for muscle cramps. - Resume rosuvastatin  if cramping persists after 2 weeks of discontinuation.  Prediabetes Blood sugar level is 5.9%, consistent with mild prediabetes. Discussed  dietary management and potential referral to a nutritionist. - Provide information on low carb, low sugar diet for prediabetes management. - Offer referral to a nutritionist if needed in the future.  Hypertension Blood pressure is well controlled.  Generalized anxiety disorder Anxiety managed with Lexapro  and Buspar . Current regimen  is effective. - Continue current regimen of Lexapro  and Buspar .          No orders of the defined types were placed in this encounter.   No orders of the defined types were placed in this encounter.    Follow up plan: Return in about 6 months (around 02/05/2025) for 6 month PreDM A1c.  Marsa Officer, DO [facility] 08/08/2024, 9:53 AM

## 2024-08-08 NOTE — Patient Instructions (Addendum)
 Thank you for coming to the office today.  Keep on pain medicine and we can adjust in future, goal to keep reducing dose and use only when needed.  [facility] at [facility] [address] [place],  [place]  [address] Main: [phone] Fax: [phone]  Next year 2026 Pap Smear  Consider pausing the Rosuvastatin  5mg  every other day pause for 2+ weeks and see if the muscle cramping goes away. - Let me know, stop the Cholesterol pill. - If doesn't go away, keep taking  OTC Hyland's Leg Cramp supplement - can use at night-time for cramps.  Try tablespoon Yellow Mustard before bed for cramping.  Recent Labs    01/26/24 0817 07/25/24 0807  HGBA1C 5.6 5.9*    CONSIDER THIS OPTION Coronary Calcium  Score Cardiac CT Scan. This is a screening test for patients aged 57-50+ with cardiovascular risk factors or who are healthy but would be interested in Cardiovascular Screening for heart disease. Even if there is a family history of heart disease, this imaging can be useful. Typically it can be done every 5+ years or at a different timeline we agree on  The scan will look at the chest and mainly focus on the heart and identify early signs of calcium  build up or blockages within the heart arteries. It is not 100% accurate for identifying blockages or heart disease, but it is useful to help us  predict who may have some early changes or be at risk in the future for a heart attack or cardiovascular problem.  The results are reviewed by a Cardiologist and they will document the results. It should become available on MyChart. Typically the results are divided into percentiles based on other patients of the same demographic and age. So it will compare your risk to others similar to you. If you have a higher score >99 or higher percentile >75%tile, it is recommended to consider Statin cholesterol therapy and or referral to Cardiologist. I will try to help explain your results and  if we have questions we can contact the Cardiologist.  You will be contacted for scheduling. Usually it is done at any imaging facility through [facility], [facility], [place] or [facility].  The cost is $99 flat fee total and it does not go through insurance, so no authorization is required.   Please schedule a Follow-up Appointment to: Return in about 6 months (around 02/05/2025) for 6 month PreDM A1c.  If you have any other questions or concerns, please feel free to call the office or send a message through MyChart. You may also schedule an earlier appointment if necessary.  Additionally, you may be receiving a survey about your experience at our office within a few days to 1 week by e-mail or mail. We value your feedback.  Marsa Officer, DO [facility], [place]

## 2024-08-14 ENCOUNTER — Other Ambulatory Visit: Payer: Self-pay | Admitting: Family Medicine

## 2024-08-14 DIAGNOSIS — F3341 Major depressive disorder, recurrent, in partial remission: Secondary | ICD-10-CM

## 2024-08-14 DIAGNOSIS — F411 Generalized anxiety disorder: Secondary | ICD-10-CM

## 2024-08-15 NOTE — Telephone Encounter (Signed)
 Requested Prescriptions  Pending Prescriptions Disp Refills   escitalopram  (LEXAPRO ) 20 MG tablet [Pharmacy Med Name: ESCITALOPRAM  20 MG TABLET] 90 tablet 0    Sig: TAKE 1 TABLET BY MOUTH AT BEDTIME     Psychiatry:  Antidepressants - SSRI Passed - 08/15/2024 12:05 PM      Passed - Completed PHQ-2 or PHQ-9 in the last 360 days      Passed - Valid encounter within last 6 months    Recent Outpatient Visits           1 week ago Chronic left-sided low back pain with left-sided sciatica   [facility] [place], Marsa PARAS, DO   6 months ago Pre-diabetes   [facility] [place], Marsa PARAS, [place]

## 2024-08-30 ENCOUNTER — Ambulatory Visit

## 2024-08-30 DIAGNOSIS — Z0000 Encounter for general adult medical examination without abnormal findings: Secondary | ICD-10-CM | POA: Diagnosis not present

## 2024-08-30 NOTE — Progress Notes (Signed)
 Subjective:   Cheryl Payne is a 58 y.o. who presents for a Medicare Wellness preventive visit.  As a reminder, Annual Wellness Visits don't include a physical exam, and some assessments may be limited, especially if this visit is performed virtually. We may recommend an in-person follow-up visit with your provider if needed.  Visit Complete: Virtual I connected with  Cheryl Payne on 08/30/24 by a audio enabled telemedicine application and verified that I am speaking with the correct person using two identifiers.  Patient Location: Home  Provider Location: Home Office  I discussed the limitations of evaluation and management by telemedicine. The patient expressed understanding and agreed to proceed.  Vital Signs: Because this visit was a virtual/telehealth visit, some criteria may be missing or patient reported. Any vitals not documented were not able to be obtained and vitals that have been documented are patient reported.  VideoDeclined- This patient declined Librarian, academic. Therefore the visit was completed with audio only.  Persons Participating in Visit: Patient.  AWV Questionnaire: No: Patient Medicare AWV questionnaire was not completed prior to this visit.  Cardiac Risk Factors include: advanced age (>29men, >85 women);dyslipidemia;hypertension;sedentary lifestyle;obesity (BMI >30kg/m2)     Objective:    There were no vitals filed for this visit. There is no height or weight on file to calculate BMI.     08/30/2024    9:35 AM 03/13/2024    7:10 PM 03/12/2024    4:57 PM 02/18/2024    1:51 PM 07/28/2023    8:34 AM 03/24/2023    9:27 AM 08/17/2022    8:31 AM  Advanced Directives  Does Patient Have a Medical Advance Directive? No No No No No No No  Would patient like information on creating a medical advance directive? No - Patient declined No - Patient declined No - Patient declined No - Patient declined No - Patient declined No - Patient  declined No - Patient declined    Current Medications (verified) Outpatient Encounter Medications as of 08/30/2024  Medication Sig   amLODipine  (NORVASC ) 5 MG tablet Take 1 tablet (5 mg total) by mouth daily.   busPIRone  (BUSPAR ) 5 MG tablet TAKE 1 TABLET BY MOUTH 3 TIMES A DAY AS NEEDED FOR ANXIETY   escitalopram  (LEXAPRO ) 20 MG tablet TAKE 1 TABLET BY MOUTH AT BEDTIME   fluticasone (FLONASE) 50 MCG/ACT nasal spray Place 1 spray into both nostrils daily.   gabapentin  (NEURONTIN ) 300 MG capsule Take 2 capsules (600 mg total) by mouth at bedtime.   loratadine ([employer] ALLERGY RELIEF) 10 MG tablet Take 10 mg by mouth daily.   oxyCODONE -acetaminophen  (PERCOCET) 10-325 MG tablet Take 1 tablet by mouth every 4 (four) hours as needed for pain.   rosuvastatin  (CRESTOR ) 5 MG tablet Take 1 tablet (5 mg total) by mouth every other day.   triamcinolone  ointment (KENALOG ) 0.1 % Apply 1 Application topically 2 (two) times daily.   No facility-administered encounter medications on file as of 08/30/2024.    Allergies (verified) Patient has no known allergies.   History: Past Medical History:  Diagnosis Date   Anxiety NA   Arthritis    Blood transfusion without reported diagnosis 2023   When i was in the hospital   Cancer [facility])    lung   Chronic left shoulder pain    Depression    GERD (gastroesophageal reflux disease)    Hypertension    Ulcer 2023   When i was in the hospital   Past Surgical History:  Procedure Laterality Date   COLONOSCOPY WITH PROPOFOL  N/A 03/24/2023   Procedure: COLONOSCOPY WITH PROPOFOL ;  Surgeon: Therisa Bi, MD;  Location: [facility] ENDOSCOPY;  Service: Gastroenterology;  Laterality: N/A;   ESOPHAGOGASTRODUODENOSCOPY (EGD) WITH PROPOFOL  N/A 08/17/2022   Procedure: ESOPHAGOGASTRODUODENOSCOPY (EGD) WITH PROPOFOL ;  Surgeon: Therisa Bi, MD;  Location: [facility] ENDOSCOPY;  Service: Gastroenterology;  Laterality: N/A;   ESOPHAGOGASTRODUODENOSCOPY (EGD) WITH PROPOFOL  N/A 03/24/2023    Procedure: ESOPHAGOGASTRODUODENOSCOPY (EGD) WITH PROPOFOL ;  Surgeon: Therisa Bi, MD;  Location: [facility] ENDOSCOPY;  Service: Gastroenterology;  Laterality: N/A;   LUNG SURGERY     Family History  Problem Relation Age of Onset   Stomach cancer Mother 4   Breast cancer Mother    Cancer Mother    Hypertension Mother    Stroke Mother    Lung cancer Father 23   Social History   Socioeconomic History   Marital status: Single    Spouse name: Not on file   Number of children: Not on file   Years of education: Not on file   Highest education level: 9th grade  Occupational History   Not on file  Tobacco Use   Smoking status: Former    Current packs/day: 0.00    Average packs/day: 1 pack/day for 35.0 years (35.0 ttl pk-yrs)    Types: Cigarettes    Quit date: 01/29/2019    Years since quitting: 5.5   Smokeless tobacco: Former    Quit date: 06/04/2018  Vaping Use   Vaping status: Never Used  Substance and Sexual Activity   Alcohol use: Not Currently    Comment: Dont drink anymore   Drug use: Not Currently    Types: Marijuana   Sexual activity: Not Currently    Birth control/protection: None  Other Topics Concern   Not on file  Social History Narrative   Not on file   Social Drivers of Health   Financial Resource Strain: Low Risk  (08/30/2024)   Overall Financial Resource Strain (CARDIA)    Difficulty of Paying Living Expenses: Not hard at all  Food Insecurity: No Food Insecurity (08/30/2024)   Hunger Vital Sign    Worried About Running Out of Food in the Last Year: Never true    Ran Out of Food in the Last Year: Never true  Transportation Needs: No Transportation Needs (08/30/2024)   PRAPARE - Administrator, Civil Service (Medical): No    Lack of Transportation (Non-Medical): No  Physical Activity: Inactive (08/30/2024)   Exercise Vital Sign    Days of Exercise per Week: 0 days    Minutes of Exercise per Session: 0 min  Stress: No Stress Concern Present  (08/30/2024)   [employer] of Occupational Health - Occupational Stress Questionnaire    Feeling of Stress: Not at all  Recent Concern: Stress - Stress Concern Present (08/06/2024)   [employer] of Occupational Health - Occupational Stress Questionnaire    Feeling of Stress: Very much  Social Connections: Moderately Isolated (08/30/2024)   Social Connection and Isolation Panel    Frequency of Communication with Friends and Family: More than three times a week    Frequency of Social Gatherings with Friends and Family: More than three times a week    Attends Religious Services: More than 4 times per year    Active Member of [employer] or Organizations: No    Attends Banker Meetings: Never    Marital Status: Never married    Tobacco Counseling Counseling given: Not Answered  Clinical Intake:  Pre-visit preparation completed: Yes  Pain : No/denies pain     BMI - recorded: 31.5 Nutritional Status: BMI > 30  Obese Nutritional Risks: None Diabetes: No  Lab Results  Component Value Date   HGBA1C 5.9 (H) 07/25/2024   HGBA1C 5.6 01/26/2024   HGBA1C 5.9 (H) 07/14/2023     How often do you need to have someone help you when you read instructions, pamphlets, or other written materials from your doctor or pharmacy?: 1 - Never  Interpreter Needed?: No  Information entered by :: Cheryl DAS, LPN   Activities of Daily Living    08/30/2024    9:36 AM 08/26/2024   12:20 AM  In your present state of health, do you have any difficulty performing the following activities:  Hearing? 0 0  Vision? 1 1  Difficulty concentrating or making decisions? 0 0  Walking or climbing stairs? 0 0  Dressing or bathing? 0 0  Doing errands, shopping? 0 0  Preparing Food and eating ? N N  Using the Toilet? N N  In the past six months, have you accidently leaked urine? N N  Do you have problems with loss of bowel control? N N  Managing your Medications? N N  Managing your  Finances? N N  Housekeeping or managing your Housekeeping? N N    Patient Care Team: Edman Marsa PARAS, DO as PCP - General (Family Medicine)  I have updated your Care Teams any recent Medical Services you may have received from other providers in the past year.     Assessment:   This is a routine wellness examination for [place].  Hearing/Vision screen Hearing Screening - Comments:: NO AIDS Vision Screening - Comments:: WEARS GLASSES ALL DAY- [facility] ON [address]- VISITS ONCE PER YEAR   Goals Addressed             This Visit's Progress    Cut out extra servings         Depression Screen     08/30/2024    9:33 AM 08/08/2024    9:24 AM 04/30/2024    4:15 PM 03/13/2024    5:49 PM 01/26/2024    8:09 AM 07/28/2023    8:33 AM 07/24/2023    8:03 AM  PHQ 2/9 Scores  PHQ - 2 Score 1 2 0 2 3 2 2   PHQ- 9 Score 2 5  4 9 4 8     Fall Risk     08/26/2024   12:20 AM 08/08/2024    9:24 AM 03/13/2024    6:34 PM 01/26/2024    8:09 AM 07/28/2023    8:35 AM  Fall Risk   Falls in the past year? 0 0 0 0 0  Number falls in past yr: 0  0  0  Injury with Fall? 0 0 0  0  Risk for fall due to : No Fall Risks No Fall Risks Impaired mobility;Medication side effect;Other (Comment)  No Fall Risks  Risk for fall due to: Comment   Experiences frequent muscle spasms and sciatica    Follow up Falls evaluation completed;Falls prevention discussed  Falls prevention discussed  Falls prevention discussed;Falls evaluation completed    MEDICARE RISK AT HOME:  Medicare Risk at Home Any stairs in or around the home?: No If so, are there any without handrails?: No Home free of loose throw rugs in walkways, pet beds, electrical cords, etc?: Yes Adequate lighting in your home to reduce risk of falls?: Yes  Life alert?: No Use of a cane, walker or w/c?: No Grab bars in the bathroom?: Yes Shower chair or bench in shower?: Yes Elevated toilet seat or a handicapped toilet?: No  TIMED UP AND GO:  Was  the test performed?  No  Cognitive Function: 6CIT completed        08/30/2024    9:37 AM 07/28/2023    8:36 AM 07/15/2022    9:11 AM  6CIT Screen  What Year? 0 points 0 points 0 points  What month? 0 points 0 points 0 points  What time? 0 points 0 points 0 points  Count back from 20 0 points 0 points 0 points  Months in reverse 2 points 0 points 0 points  Repeat phrase 0 points 2 points 0 points  Total Score 2 points 2 points 0 points    Immunizations Immunization History  Administered Date(s) Administered   Influenza, Mdck, Trivalent,PF 6+ MOS(egg free) 09/14/2023   Influenza,inj,Quad PF,6+ Mos 08/24/2021   Influenza-Unspecified 08/04/2024   Moderna Covid-19 Fall Seasonal Vaccine 69yrs & older 01/19/2023   Moderna Sars-Covid-2 Vaccination 03/09/2021   PFIZER(Purple Top)SARS-COV-2 Vaccination 02/21/2020, 03/13/2020, 09/21/2020, 03/09/2021   PNEUMOCOCCAL CONJUGATE-20 08/04/2024   Zoster Recombinant(Shingrix ) 12/02/2022, 03/06/2023    Screening Tests Health Maintenance  Topic Date Due   DTaP/Tdap/Td (1 - Tdap) Never done   Hepatitis B Vaccines 19-59 Average Risk (1 of 3 - 19+ 3-dose series) Never done   COVID-19 Vaccine (7 - 2025-26 season) 08/05/2024   Cervical Cancer Screening (HPV/Pap Cotest)  07/29/2025   Medicare Annual Wellness (AWV)  08/30/2025   Colonoscopy  03/23/2026   Pneumococcal Vaccine: 50+ Years  Completed   Influenza Vaccine  Completed   Hepatitis C Screening  Completed   HIV Screening  Completed   Zoster Vaccines- Shingrix   Completed   HPV VACCINES  Aged Out   Meningococcal B Vaccine  Aged Out   Mammogram  Discontinued   Fecal DNA (Cologuard)  Discontinued    Health Maintenance Items Addressed: UP TO DATE ON SHOTS EXCEPT TDAP; UP TO DATE ON COLONOSCOPY & MAMMOGRAM  Additional Screening:  Vision Screening: Recommended annual ophthalmology exams for early detection of glaucoma and other disorders of the eye. Is the patient up to date with their  annual eye exam?  Yes  Who is the provider or what is the name of the office in which the patient attends annual eye exams? [facility]  Dental Screening: Recommended annual dental exams for proper oral hygiene  Community Resource Referral / Chronic Care Management: CRR required this visit?  No   CCM required this visit?  No   Plan:    I have personally reviewed and noted the following in the patient's chart:   Medical and social history Use of alcohol, tobacco or illicit drugs  Current medications and supplements including opioid prescriptions. Patient is currently taking opioid prescriptions. Information provided to patient regarding non-opioid alternatives. Patient advised to discuss non-opioid treatment plan with their provider. Functional ability and status Nutritional status Physical activity Advanced directives List of other physicians Hospitalizations, surgeries, and ER visits in previous 12 months Vitals Screenings to include cognitive, depression, and falls Referrals and appointments  In addition, I have reviewed and discussed with patient certain preventive protocols, quality metrics, and best practice recommendations. A written personalized care plan for preventive services as well as general preventive health recommendations were provided to patient.   Cheryl GORMAN Das, LPN   [id number]   After Visit Summary: (MyChart) Due to  this being a telephonic visit, the after visit summary with patients personalized plan was offered to patient via MyChart   Notes: Nothing significant to report at this time.

## 2024-08-30 NOTE — Patient Instructions (Addendum)
 Cheryl Payne,  Thank you for taking the time for your Medicare Wellness Visit. I appreciate your continued commitment to your health goals. Please review the care plan we discussed, and feel free to reach out if I can assist you further.  Medicare recommends these wellness visits once per year to help you and your care team stay ahead of potential health issues. These visits are designed to focus on prevention, allowing your provider to concentrate on managing your acute and chronic conditions during your regular appointments.  Please note that Annual Wellness Visits do not include a physical exam. Some assessments may be limited, especially if the visit was conducted virtually. If needed, we may recommend a separate in-person follow-up with your provider.  Ongoing Care Seeing your primary care provider every 3 to 6 months helps us  monitor your health and provide consistent, personalized care.   Referrals If a referral was made during today's visit and you haven't received any updates within two weeks, please contact the referred provider directly to check on the status.  Recommended Screenings:  Health Maintenance  Topic Date Due   DTaP/Tdap/Td vaccine (1 - Tdap) Never done   Hepatitis B Vaccine (1 of 3 - 19+ 3-dose series) Never done   COVID-19 Vaccine (7 - 2025-26 season) 08/05/2024   Pap with HPV screening  07/29/2025   Medicare Annual Wellness Visit  08/30/2025   Colon Cancer Screening  03/23/2026   Pneumococcal Vaccine for age over 18  Completed   Flu Shot  Completed   Hepatitis C Screening  Completed   HIV Screening  Completed   Zoster (Shingles) Vaccine  Completed   HPV Vaccine  Aged Out   Meningitis B Vaccine  Aged Out   Breast Cancer Screening  Discontinued   Cologuard (Stool DNA test)  Discontinued     Advance Care Planning is important because it: Ensures you receive medical care that aligns with your values, goals, and preferences. Provides guidance to your family and  loved ones, reducing the emotional burden of decision-making during critical moments.  Vision: Annual vision screenings are recommended for early detection of glaucoma, cataracts, and diabetic retinopathy. These exams can also reveal signs of chronic conditions such as diabetes and high blood pressure.  Dental: Annual dental screenings help detect early signs of oral cancer, gum disease, and other conditions linked to overall health, including heart disease and diabetes.  Please see the attached documents for additional preventive care recommendations.   NEXT AWV 09/12/25 @ 10:10 AM BY PHONE

## 2024-09-10 ENCOUNTER — Other Ambulatory Visit: Payer: Self-pay | Admitting: Family Medicine

## 2024-09-10 DIAGNOSIS — M5416 Radiculopathy, lumbar region: Secondary | ICD-10-CM

## 2024-09-10 DIAGNOSIS — G8929 Other chronic pain: Secondary | ICD-10-CM

## 2024-09-10 NOTE — Telephone Encounter (Unsigned)
 Copied from CRM #[id number]. Topic: Clinical - Medication Refill >> Sep 10, 2024  9:38 AM Antwanette L wrote: Medication: oxyCODONE -acetaminophen  (PERCOCET) 10-325 MG tablet  Has the patient contacted their pharmacy? Yes   This is the patient's preferred pharmacy:  [id number] [place], [place] - [address] [place] [address] Phone: [phone] Fax: [phone]  Is this the correct pharmacy for this prescription? Yes   Has the prescription been filled recently? Yes. Last refill was on 08/02/24  Is the patient out of the medication? Yes  Has the patient been seen for an appointment in the last year OR does the patient have an upcoming appointment? Yes. Last ov w/ Dr. Edman   Can we respond through MyChart? Yes  Agent: Please be advised that Rx refills may take up to 3 business days. We ask that you follow-up with your pharmacy.

## 2024-09-12 NOTE — Telephone Encounter (Signed)
 Requested medication (s) are due for refill today: Yes  Requested medication (s) are on the active medication list: Yes  Last refill:  08/02/24  Future visit scheduled: Yes  Notes to clinic:  Unable to refill per protocol, cannot delegate.      Requested Prescriptions  Pending Prescriptions Disp Refills   oxyCODONE -acetaminophen  (PERCOCET) 10-325 MG tablet 30 tablet 0    Sig: Take 1 tablet by mouth every 4 (four) hours as needed for pain.     Not Delegated - Analgesics:  Opioid Agonist Combinations Failed - 09/12/2024  5:55 PM      Failed - This refill cannot be delegated      Failed - Urine Drug Screen completed in last 360 days      Passed - Valid encounter within last 3 months    Recent Outpatient Visits           1 month ago Chronic left-sided low back pain with left-sided sciatica   [facility] Edman Marsa PARAS, DO   7 months ago Pre-diabetes    [facility] [place], Marsa PARAS, [place]

## 2024-09-13 MED ORDER — OXYCODONE-ACETAMINOPHEN 10-325 MG PO TABS: 1.0000 | ORAL_TABLET | ORAL | 0 refills | Status: DC | PRN

## 2024-10-22 ENCOUNTER — Telehealth: Payer: Self-pay | Admitting: Family Medicine

## 2024-10-28 ENCOUNTER — Ambulatory Visit: Payer: Self-pay

## 2024-10-28 NOTE — Telephone Encounter (Signed)
 FYI Only or Action Required?: FYI only for provider: appointment scheduled on 12/01.2025.  Patient was last seen in primary care on 08/08/2024 by Cheryl Marsa PARAS, DO.  Called Nurse Triage reporting Shoulder Pain.  Symptoms began several years ago.  Interventions attempted: OTC medications: Tylenol  (pt states it doesn't help).  Symptoms are: gradually worsening.  Triage Disposition: See PCP Within 2 Weeks  Patient/caregiver understands and will follow disposition?: Yes             Copied from CRM #[id number]. Topic: Clinical - Red Word Triage >> Oct 28, 2024  9:17 AM Cheryl Payne wrote: Red Word that prompted transfer to Nurse Triage: Unbearable shoulder pain   ----------------------------------------------------------------------- From previous Reason for Contact - Scheduling: Patient/patient representative is calling to schedule an appointment. Refer to attachments for appointment information. Reason for Disposition  Shoulder pain is a chronic symptom (recurrent or ongoing AND present > 4 weeks)  Answer Assessment - Initial Assessment Questions This RN scheduled pt for first available appointment with PCP on Mon, 12/1. This RN added pt to wait list as well. Pt did not want to see a different provider. This RN educated pt on new-worsening symptoms and when to call back/seek emergent care. Pt verbalized understanding and agrees to plan.   Left shoulder pain Onset: a few years but worsening Pinching sensation Constant pain but hurts worse with movement (6/10 pain level now) Hurts to take a shower, lift arm   Pt states she got a shot in her shoulder years ago that helped the pain Cause: bursitis Denies numbness  Protocols used: Shoulder Pain-A-AH

## 2024-11-04 ENCOUNTER — Other Ambulatory Visit: Payer: Self-pay | Admitting: Family Medicine

## 2024-11-04 ENCOUNTER — Encounter: Payer: Self-pay | Admitting: Family Medicine

## 2024-11-04 ENCOUNTER — Ambulatory Visit: Admitting: Family Medicine

## 2024-11-04 VITALS — BP 124/70 | HR 79 | Ht 62.0 in | Wt 177.4 lb

## 2024-11-04 DIAGNOSIS — E7800 Pure hypercholesterolemia, unspecified: Secondary | ICD-10-CM

## 2024-11-04 DIAGNOSIS — F411 Generalized anxiety disorder: Secondary | ICD-10-CM

## 2024-11-04 DIAGNOSIS — G8929 Other chronic pain: Secondary | ICD-10-CM

## 2024-11-04 DIAGNOSIS — M7552 Bursitis of left shoulder: Secondary | ICD-10-CM

## 2024-11-04 DIAGNOSIS — F3341 Major depressive disorder, recurrent, in partial remission: Secondary | ICD-10-CM

## 2024-11-04 DIAGNOSIS — M25512 Pain in left shoulder: Secondary | ICD-10-CM

## 2024-11-04 DIAGNOSIS — I10 Essential (primary) hypertension: Secondary | ICD-10-CM

## 2024-11-04 DIAGNOSIS — M5416 Radiculopathy, lumbar region: Secondary | ICD-10-CM

## 2024-11-04 MED ORDER — METHYLPREDNISOLONE ACETATE 40 MG/ML IJ SUSP
40.0000 mg | Freq: Once | INTRAMUSCULAR | Status: AC
  Administered 2024-11-04: 40 mg via INTRA_ARTICULAR

## 2024-11-04 MED ORDER — LIDOCAINE HCL (PF) 1 % IJ SOLN
4.0000 mL | Freq: Once | INTRAMUSCULAR | Status: AC
  Administered 2024-11-04: 4 mL

## 2024-11-04 MED ORDER — GABAPENTIN 300 MG PO CAPS: 900.0000 mg | ORAL_CAPSULE | Freq: Every day | ORAL | 3 refills | Status: AC

## 2024-11-04 MED ORDER — AMLODIPINE BESYLATE 5 MG PO TABS: 5.0000 mg | ORAL_TABLET | Freq: Every day | ORAL | 3 refills | Status: AC

## 2024-11-04 MED ORDER — ROSUVASTATIN CALCIUM 5 MG PO TABS: 5.0000 mg | ORAL_TABLET | ORAL | 3 refills | Status: AC

## 2024-11-04 MED ORDER — OXYCODONE-ACETAMINOPHEN 10-325 MG PO TABS: 1.0000 | ORAL_TABLET | ORAL | 0 refills | Status: AC | PRN

## 2024-11-04 MED ORDER — BUSPIRONE HCL 5 MG PO TABS: ORAL_TABLET | ORAL | 3 refills | Status: AC

## 2024-11-04 MED ORDER — ESCITALOPRAM OXALATE 20 MG PO TABS: 20.0000 mg | ORAL_TABLET | Freq: Every day | ORAL | 3 refills | Status: AC

## 2024-11-04 NOTE — Progress Notes (Signed)
 Subjective:    Patient ID: Cheryl Payne, female    DOB: 28-Sep-1966, 58 y.o.   MRN: [id number]  Cheryl Payne is a 58 y.o. female presenting on 11/04/2024 for Shoulder Pain (Left hurting for a while and getting worse)  Patient presents for a same day appointment.  HPI  Discussed the use of AI scribe software for clinical note transcription with the patient, who gave verbal consent to proceed.  History of Present Illness   Cheryl Payne is a 58 year old female who presents with a flare-up of left shoulder pain.  Chronic Left shoulder pain Left Shoulder Rotator Cuff Bursitis / Tendinopathy Past history going back to 2021 documented with same problem and prior injections yearly with good results. Now overdue for injection  - Persistent pain described as 'pressure' in the left shoulder for several years - Pain intensified recently - Difficulty with overhead activities, including lifting arms, taking clothes off, and washing hair - Shoulder has not returned to normal function - Significant limitation in daily activities due to pain  Prior treatments for left shoulder pain - Managed with pain relief medications, gabapentin , anti-inflammatories, and corticosteroid injections - Received annual corticosteroid injections for the past three years, providing temporary relief - Oxycodone  has been effective in easing pain, though primarily prescribed for back pain - Gabapentin  taken two at night and sometimes one during the day  Imaging and objective findings - Previous imaging at [facility] two years ago showed mild AC joint bone spurs and mild arthritis with maintained joint space - No recent imaging performed  Medication needs - Requires refills for several prescriptions, including oxycodone            11/04/2024    8:00 AM 08/30/2024    9:33 AM 08/08/2024    9:24 AM  Depression screen PHQ 2/9  Decreased Interest 0 0 1  Down, Depressed, Hopeless 1 1 1   PHQ - 2 Score 1 1 2   Altered  sleeping 2 0 2  Tired, decreased energy 1 1 1   Change in appetite 0 0 0  Feeling bad or failure about yourself  0 0 0  Trouble concentrating 0 0 0  Moving slowly or fidgety/restless 0 0 0  Suicidal thoughts 0 0 0  PHQ-9 Score 4 2  5    Difficult doing work/chores Somewhat difficult Not difficult at all Somewhat difficult     Data saved with a previous flowsheet row definition       11/04/2024    8:00 AM 08/08/2024    9:24 AM 03/13/2024    7:09 PM 01/26/2024    8:09 AM  GAD 7 : Generalized Anxiety Score  Nervous, Anxious, on Edge 1 1 1 1   Control/stop worrying 1 1 1 2   Worry too much - different things 1 1 1 1   Trouble relaxing 1 1 1 1   Restless 1 1 1 1   Easily annoyed or irritable 1 2 0 2  Afraid - awful might happen 0 0 0 0  Total GAD 7 Score 6 7 5 8   Anxiety Difficulty Somewhat difficult Somewhat difficult Somewhat difficult Somewhat difficult    Social History   Tobacco Use   Smoking status: Former    Current packs/day: 0.00    Average packs/day: 1 pack/day for 35.0 years (35.0 ttl pk-yrs)    Types: Cigarettes    Quit date: 01/29/2019    Years since quitting: 5.7   Smokeless tobacco: Former    Quit date: 06/04/2018  Vaping Use  Vaping status: Never Used  Substance Use Topics   Alcohol use: Not Currently    Comment: Dont drink anymore   Drug use: Not Currently    Types: Marijuana    Review of Systems Per HPI unless specifically indicated above     Objective:    BP 124/70 (BP Location: Right Arm, Patient Position: Sitting, Cuff Size: Normal)   Pulse 79   Ht 5' 2 (1.575 m)   Wt 177 lb 6 oz (80.5 kg)   SpO2 98%   BMI 32.44 kg/m   Wt Readings from Last 3 Encounters:  11/04/24 177 lb 6 oz (80.5 kg)  08/08/24 172 lb 4 oz (78.1 kg)  02/26/24 159 lb (72.1 kg)    Physical Exam Vitals and nursing note reviewed.  Constitutional:      General: She is not in acute distress.    Appearance: Normal appearance. She is well-developed. She is not diaphoretic.      Comments: Well-appearing, comfortable, cooperative  HENT:     Head: Normocephalic and atraumatic.  Eyes:     General:        Right eye: No discharge.        Left eye: No discharge.     Conjunctiva/sclera: Conjunctivae normal.  Cardiovascular:     Rate and Rhythm: Normal rate.  Pulmonary:     Effort: Pulmonary effort is normal.  Musculoskeletal:     Comments: Left Shoulder Reduced ROM forward flex above shoulder, and positive impingement.  Skin:    General: Skin is warm and dry.     Findings: No erythema or rash.  Neurological:     Mental Status: She is alert and oriented to person, place, and time.  Psychiatric:        Mood and Affect: Mood normal.        Behavior: Behavior normal.        Thought Content: Thought content normal.     Comments: Well groomed, good eye contact, normal speech and thoughts    ________________________________________________________ PROCEDURE NOTE Date: 11/04/24 Left Shoulder subacromail injection Discussed benefits and risks (including pain, bleeding, infection, steroid flare). Verbal consent given by patient. Medication:  1 cc Depo-medrol  40mg  and 4 cc Lidocaine  1% without epi Time Out taken  Landmarks identified. Area cleansed with alcohol wipes. Using 21 gauge and 1, 1/2 inch needle, Left subacromial bursa space was injected (with above listed medication) via posterior approach cold spray used for superficial anesthetic. Sterile bandage placed. Patient tolerated procedure well without bleeding or paresthesias. No complications.   I have personally reviewed the radiology report from 08/23/22 on X-ray Left Shoulder.  X-ray shoulder complete left minimum 2 views  Anatomical Region Laterality Modality  Shoulder Left -- Radiographic Imaging  Humerus Left -- --  Clavicle Left -- --  ORTHO Shoulder Left -- --   Narrative  EXAM: Left Shoulder Radiographs - 3 views (Grashey, Axillary, Scapular Y) performed 08/23/2022  CLINICAL INFORMATION:  Chronic left shoulder pain  COMPARISON: None  FINDINGS:   Type II acromion.  There is os acromiale with osteophytes.  Mild AC joint change with osteophyte formation.  The distal clavicle, scapula, and proximal humerus are intact.  Glenohumeral and acromioclavicular joint alignment appears normal.  No fractures or dislocations.  Well maintained glenohumeral joint space without evidence of degenerative joint disease.   No soft tissue swelling or joint effusion.  No destructive bony lesion is identified.  Mild enthesopathic changes around the shoulder. Exam End: 08/23/22 08:36 Last Resulted: 08/23/22 08:58  Received From: [facility]  Result Received: 01/31/24 15:23     Results for orders placed or performed in visit on 07/25/24  CBC with Differential/Platelet   Collection Time: 07/25/24  8:07 AM  Result Value Ref Range   WBC 5.5 3.8 - 10.8 Thousand/uL   RBC 4.27 3.80 - 5.10 Million/uL   Hemoglobin 13.7 11.7 - 15.5 g/dL   HCT 57.9 64.9 - 54.9 %   MCV 98.4 80.0 - 100.0 fL   MCH 32.1 27.0 - 33.0 pg   MCHC 32.6 32.0 - 36.0 g/dL   RDW 84.1 (H) 88.9 - 84.9 %   Platelets 241 140 - 400 Thousand/uL   MPV 11.7 7.5 - 12.5 fL   Neutro Abs 2,134 1,500 - 7,800 cells/uL   Absolute Lymphocytes 2,662 850 - 3,900 cells/uL   Absolute Monocytes 572 200 - 950 cells/uL   Eosinophils Absolute 83 15 - 500 cells/uL   Basophils Absolute 50 0 - 200 cells/uL   Neutrophils Relative % 38.8 %   Total Lymphocyte 48.4 %   Monocytes Relative 10.4 %   Eosinophils Relative 1.5 %   Basophils Relative 0.9 %  COMPLETE METABOLIC PANEL WITH GFR   Collection Time: 07/25/24  8:07 AM  Result Value Ref Range   Glucose, Bld 105 (H) 65 - 99 mg/dL   BUN 11 7 - 25 mg/dL   Creat 9.32 9.49 - 8.96 mg/dL   BUN/Creatinine Ratio SEE NOTE: 6 - 22 (calc)   Sodium 141 135 - 146 mmol/L   Potassium 4.9 3.5 - 5.3 mmol/L   Chloride 104 98 - 110 mmol/L   CO2 28 20 - 32 mmol/L   Calcium  9.6 8.6 - 10.4 mg/dL    Total Protein 6.8 6.1 - 8.1 g/dL   Albumin 4.3 3.6 - 5.1 g/dL   Globulin 2.5 1.9 - 3.7 g/dL (calc)   AG Ratio 1.7 1.0 - 2.5 (calc)   Total Bilirubin 0.4 0.2 - 1.2 mg/dL   Alkaline phosphatase (APISO) 82 37 - 153 U/L   AST 10 10 - 35 U/L   ALT 8 6 - 29 U/L  Hemoglobin A1c   Collection Time: 07/25/24  8:07 AM  Result Value Ref Range   Hgb A1c MFr Bld 5.9 (H) <5.7 %   Mean Plasma Glucose 123 mg/dL   eAG (mmol/L) 6.8 mmol/L  Lipid panel   Collection Time: 07/25/24  8:07 AM  Result Value Ref Range   Cholesterol 217 (H) <200 mg/dL   HDL 72 > OR = 50 mg/dL   Triglycerides 92 <849 mg/dL   LDL Cholesterol (Calc) 125 (H) mg/dL (calc)   Total CHOL/HDL Ratio 3.0 <5.0 (calc)   Non-HDL Cholesterol (Calc) 145 (H) <130 mg/dL (calc)  TSH   Collection Time: 07/25/24  8:07 AM  Result Value Ref Range   TSH 1.71 0.40 - 4.50 mIU/L      Assessment & Plan:   Problem List Items Addressed This Visit     Chronic left shoulder pain   Other Visit Diagnoses       Chronic bursitis of left shoulder    -  Primary   Relevant Medications   lidocaine  (PF) (XYLOCAINE ) 1 % injection 4 mL (Completed)   methylPREDNISolone  acetate (DEPO-MEDROL ) injection 40 mg (Completed)        Left shoulder pain and rotator cuff pathology Chronic left shoulder pain with rotator cuff pathology, exacerbated by overhead activities. Previous X-ray at [facility] imaging showed mild AC joint arthritis and  bone spurs.  Prior L shoulder subacromial shoulder injections done 2022, 2023, 2024, and now 2025, has had once per year roughly in winter Differential includes rotator cuff muscle damage, possibly a partial or full tear. Current management focuses on symptomatic relief and delaying surgical intervention. Informed that injections may eventually lose efficacy, and future referral to orthopedics may be necessary if symptoms persist or worsen.  - Administered corticosteroid injection to left shoulder. - Ordered refills for  all current medications, including oxycodone  and gabapentin . - Discussed potential future referral to orthopedics if injections become less effective. - Scheduled follow-up appointment in March.       No orders of the defined types were placed in this encounter.   Meds ordered this encounter  Medications   lidocaine  (PF) (XYLOCAINE ) 1 % injection 4 mL   methylPREDNISolone  acetate (DEPO-MEDROL ) injection 40 mg    Follow up plan: Return if symptoms worsen or fail to improve.   Marsa Officer, DO [facility] 11/04/2024, 8:15 AM

## 2024-11-04 NOTE — Patient Instructions (Addendum)
Thank you for coming to the office today.  You received a Left Shoulder Joint steroid injection today. - Lidocaine numbing medicine may ease the pain initially for a few hours until it wears off - As discussed, you may experience a "steroid flare" this evening or within 24-48 hours, anytime medicine is injected into an inflamed joint it can cause the pain to get worse temporarily - Everyone responds differently to these injections, it depends on the patient and the severity of the joint problem, it may provide anywhere from days to weeks, to months of relief. Ideal response is >6 months relief - Try to take it easy for next 1-2 days, avoid over activity and strain on joint (limit lifting for shoulder) - Recommend the following:   - For swelling - rest, compression sleeve / ACE wrap, elevation, and ice packs as needed for first few days   - For pain in future may use heating pad or moist heat as needed  Please schedule a Follow-up Appointment to: Return if symptoms worsen or fail to improve.  If you have any other questions or concerns, please feel free to call the office or send a message through [place]. You may also schedule an earlier appointment if necessary.  Additionally, you may be receiving a survey about your experience at our office within a few days to 1 week by e-mail or mail. We value your feedback.  Nobie Putnam, DO [place]

## 2025-01-20 ENCOUNTER — Encounter: Admitting: Advanced Practice Midwife

## 2025-02-05 ENCOUNTER — Ambulatory Visit: Admitting: Family Medicine

## 2025-09-12 ENCOUNTER — Ambulatory Visit
